# Patient Record
Sex: Female | Born: 1975
Health system: Southern US, Community
[De-identification: ages and names within clinical notes are randomized; demographics above are authoritative.]

## PROBLEM LIST (undated history)

## (undated) DIAGNOSIS — K649 Unspecified hemorrhoids: Secondary | ICD-10-CM

## (undated) DIAGNOSIS — E079 Disorder of thyroid, unspecified: Secondary | ICD-10-CM

## (undated) DIAGNOSIS — Z1501 Genetic susceptibility to malignant neoplasm of breast: Secondary | ICD-10-CM

## (undated) DIAGNOSIS — Z1509 Genetic susceptibility to other malignant neoplasm: Secondary | ICD-10-CM

## (undated) DIAGNOSIS — E039 Hypothyroidism, unspecified: Secondary | ICD-10-CM

## (undated) DIAGNOSIS — O24419 Gestational diabetes mellitus in pregnancy, unspecified control: Secondary | ICD-10-CM

## (undated) DIAGNOSIS — M549 Dorsalgia, unspecified: Secondary | ICD-10-CM

## (undated) DIAGNOSIS — R51 Headache: Secondary | ICD-10-CM

## (undated) DIAGNOSIS — Z803 Family history of malignant neoplasm of breast: Secondary | ICD-10-CM

## (undated) DIAGNOSIS — Z8632 Personal history of gestational diabetes: Secondary | ICD-10-CM

## (undated) DIAGNOSIS — F419 Anxiety disorder, unspecified: Secondary | ICD-10-CM

## (undated) HISTORY — DX: Family history of malignant neoplasm of breast: Z80.3

## (undated) HISTORY — DX: Disorder of thyroid, unspecified: E07.9

## (undated) HISTORY — DX: Genetic susceptibility to malignant neoplasm of breast: Z15.09

## (undated) HISTORY — PX: OTHER SURGICAL HISTORY: SHX169

## (undated) HISTORY — DX: Headache: R51

## (undated) HISTORY — DX: Gestational diabetes mellitus in pregnancy, unspecified control: O24.419

## (undated) HISTORY — DX: Dorsalgia, unspecified: M54.9

## (undated) HISTORY — DX: Genetic susceptibility to malignant neoplasm of breast: Z15.01

## (undated) HISTORY — DX: Unspecified hemorrhoids: K64.9

## (undated) HISTORY — DX: Anxiety disorder, unspecified: F41.9

## (undated) HISTORY — PX: TRIGGER FINGER RELEASE: SHX641

## (undated) HISTORY — PX: TUBAL LIGATION: SHX77

---

## 1998-03-21 ENCOUNTER — Other Ambulatory Visit: Admission: RE | Admit: 1998-03-21 | Discharge: 1998-03-21 | Payer: Self-pay | Admitting: Obstetrics and Gynecology

## 2001-02-03 ENCOUNTER — Other Ambulatory Visit: Admission: RE | Admit: 2001-02-03 | Discharge: 2001-02-03 | Payer: Self-pay | Admitting: Obstetrics and Gynecology

## 2002-02-06 ENCOUNTER — Other Ambulatory Visit: Admission: RE | Admit: 2002-02-06 | Discharge: 2002-02-06 | Payer: Self-pay | Admitting: Obstetrics & Gynecology

## 2002-08-08 ENCOUNTER — Other Ambulatory Visit: Admission: RE | Admit: 2002-08-08 | Discharge: 2002-08-08 | Payer: Self-pay | Admitting: Obstetrics and Gynecology

## 2003-02-11 ENCOUNTER — Other Ambulatory Visit: Admission: RE | Admit: 2003-02-11 | Discharge: 2003-02-11 | Payer: Self-pay | Admitting: Obstetrics & Gynecology

## 2005-01-25 ENCOUNTER — Encounter: Admission: RE | Admit: 2005-01-25 | Discharge: 2005-03-14 | Payer: Self-pay | Admitting: Obstetrics and Gynecology

## 2005-05-25 ENCOUNTER — Inpatient Hospital Stay (HOSPITAL_COMMUNITY): Admission: AD | Admit: 2005-05-25 | Discharge: 2005-05-29 | Payer: Self-pay | Admitting: Obstetrics and Gynecology

## 2005-05-26 ENCOUNTER — Encounter (INDEPENDENT_AMBULATORY_CARE_PROVIDER_SITE_OTHER): Payer: Self-pay | Admitting: *Deleted

## 2006-12-29 ENCOUNTER — Other Ambulatory Visit: Admission: RE | Admit: 2006-12-29 | Discharge: 2006-12-29 | Payer: Self-pay | Admitting: Radiology

## 2008-04-02 ENCOUNTER — Encounter: Admission: RE | Admit: 2008-04-02 | Discharge: 2008-07-01 | Payer: Self-pay | Admitting: Obstetrics and Gynecology

## 2008-07-30 ENCOUNTER — Inpatient Hospital Stay (HOSPITAL_COMMUNITY): Admission: RE | Admit: 2008-07-30 | Discharge: 2008-08-02 | Payer: Self-pay | Admitting: Obstetrics and Gynecology

## 2008-08-01 ENCOUNTER — Ambulatory Visit: Payer: Self-pay | Admitting: Vascular Surgery

## 2008-08-01 ENCOUNTER — Encounter (INDEPENDENT_AMBULATORY_CARE_PROVIDER_SITE_OTHER): Payer: Self-pay | Admitting: Obstetrics and Gynecology

## 2010-06-23 LAB — GLUCOSE, CAPILLARY
Glucose-Capillary: 72 mg/dL (ref 70–99)
Glucose-Capillary: 88 mg/dL (ref 70–99)

## 2010-06-23 LAB — CBC
MCHC: 35.7 g/dL (ref 30.0–36.0)
MCV: 91.9 fL (ref 78.0–100.0)
MCV: 92.5 fL (ref 78.0–100.0)
Platelets: 130 10*3/uL — ABNORMAL LOW (ref 150–400)
RBC: 3.57 MIL/uL — ABNORMAL LOW (ref 3.87–5.11)
RBC: 4.27 MIL/uL (ref 3.87–5.11)
RDW: 13.3 % (ref 11.5–15.5)
WBC: 9 10*3/uL (ref 4.0–10.5)

## 2010-06-23 LAB — RPR: RPR Ser Ql: NONREACTIVE

## 2010-06-23 LAB — BASIC METABOLIC PANEL
BUN: 7 mg/dL (ref 6–23)
Calcium: 9.1 mg/dL (ref 8.4–10.5)
Creatinine, Ser: 0.66 mg/dL (ref 0.4–1.2)
GFR calc Af Amer: >60 mL/min (ref >60)
GFR calc non Af Amer: >60 mL/min (ref >60)

## 2010-07-28 NOTE — Discharge Summary (Signed)
NAMECATHYANN, [name] NO.:  [id number]   MEDICAL RECORD NO.:  [id number]          PATIENT TYPE:  INP   LOCATION:  9116                          FACILITY:  [facility]   PHYSICIAN:  [name], M.D.DATE OF BIRTH:  [birth year]   DATE OF ADMISSION:  07/30/2008  DATE OF DISCHARGE:  08/02/2008                               DISCHARGE SUMMARY   ADMITTING DIAGNOSES:  39 weeks, repeat cesarean section, gestational  diabetes, Michelle Aden, MD   DISCHARGE DIAGNOSES:  39 weeks status post cesarean section, gestational  diabetes, Michelle Aden, MD   Michelle patient is a G2, P2 with a due date of Aug 06, 2008, initiated  prenatal care at 7 weeks at [name] with Dr. Billy Randolph as primary  [name].  Risk factors in pregnancy are significant for gestational  diabetes.  Medical history significant for hypothyroidism and Thygeson  disease which has been stable for 6 months and also notable that Michelle  patient has a history of infertility and conceived on Clomid on this  gestation and during Michelle pregnancy, was on glyburide 1.25 mg per day,  prenatal vitamins, and also on Synthroid 50 mcg p.o. daily.  Surgical  history is significant for previous C-section.  Social history is  significant for Michelle patient is married.  Michelle patient presented to [name] for repeat C-section and postoperative course has been  noncomplicated and routine.  At Michelle time of discharge, Michelle patient is  stable, vital signs were stable.  Preop labs work is significant for a  CBC.  Preoperatively, a CBC was obtained on May 17, white blood count  was 9.0, hemoglobin was 14, hematocrit was 39.3, platelets 430.  Postoperatively, on May 19, white blood count was 10.9, hemoglobin was  11.8, hematocrit is 33, platelet count is 107.  At Michelle time of  discharge, Michelle patient subjectively is feeling well and eager for  discharge to home.  She has had a history of calf pain and some bruising  to Michelle left lower  extremity, questionable varicosity inflammation and  Doppler studies have been negative.  Calf pain today is decreased.  She  is tolerating p.o. well.  She is voiding without problems.  Positive  flatus.  Pain is controlled with Percocet.  She does have marked nipple  pain and some redness.  Pain increased with nursing; otherwise,  breastfeeding is going well.  Objectively, today vital signs are stable.  Temperature 98.3, heart rate 78, respiratory rate 20, blood pressure is  126/76.  She is alert and oriented x3 in no acute distress.  Lungs were  clear to auscultation.  Heart with a regular rate and rhythm.  Abdomen  is soft, nontender, positive bowel sounds x4 quadrants.  Incision with  staples were intact.  Small amount of ecchymosis noted superior to Michelle  incision and staples.  Scant amount of lochia noted.  Extremities with  +1 pedal edema.  Mild ecchymosis to Michelle left calf.  On Michelle inner aspect,  it is  approximately 5 cm, nontender to palpation.  Negative Homans  sign.   ASSESSMENT AND PLAN:  On postoperative day #3 repeat is that Michelle pain in  left calf with Michelle varicosity has decreased dramatically.  Preliminary  Doppler study is negative and final report is pending.  Michelle patient is  in stable status for discharge to home.  Staple removal will occur in  Michelle office.  Upon discharge, prescriptions given to patient include  Motrin 600 mg p.o. every 6 hours p.r.n. #30 and Percocet 1-2 every 4  hours p.r.n. #30, no refill.  Also, a prescription for [name]  nipple cream to apply q.i.d. and p.r.n., dispense 4 ounces with refill  x1.  Activity  is up ad lib.  Diet as tolerated.  Continue postop orders.  [facility] was given.  Advised Michelle patient to report fever,  increased redness, swelling or drainage at incision site and again  followup appointment needs to be made for May 24 to have staple removal  in Michelle office.      Michelle Lindau, NP       [name], M.D.  Electronically Signed    JF/MEDQ  D:  08/02/2008  T:  08/03/2008  Job:  [id number]

## 2010-07-28 NOTE — Op Note (Signed)
NAMEABRAR, KOONE NO.:  [id number]   MEDICAL RECORD NO.:  [id number]          PATIENT TYPE:  INP   LOCATION:  9115                          FACILITY:  [facility]   PHYSICIAN:  Lenoard Aden, M.D.DATE OF BIRTH:  [birth year]   DATE OF PROCEDURE:  DATE OF DISCHARGE:                               OPERATIVE REPORT   PREOPERATIVE DIAGNOSES:  1. A 39-week previous cesarean section for repeat.  2. Gestational diabetes.   POSTOPERATIVE DIAGNOSES:  1. A 39-week previous cesarean section for repeat.  2. Gestational diabetes.   PROCEDURE:  Repeat low-segment transverse cesarean section.   SURGEON:  Lenoard Aden, MD   ASSISTANT:  Merrilee Jansky, CNM   ANESTHESIA:  Spinal by Jean Rosenthal.   ESTIMATED BLOOD LOSS:  1000 mL.   COMPLICATIONS:  None.   DRAINS:  Foley.   COUNTS:  Correct.   DISPOSITION:  The patient to recovery in good condition.   FINDINGS:  Full-term living female, occiput anterior position, Apgars of 8  and 9, anterior placenta, clear fluid.  Normal right tube and ovary,  congenitally absent left tube and ovary, two-layer uterine closure.   BRIEF OPERATIVE NOTE:  After being apprised risks of anesthesia,  infection, bleeding, injury to abdominal organs, need for repair,  delayed versus immediate complications to include bowel and bladder  injury.  The patient was brought to the operating room where she was  administered spinal anesthetic without complications, prepped, and  draped in usual sterile fashion.  Foley catheter placed after achieving  adequate anesthesia, dilute Marcaine solution was placed.  Pfannenstiel  skin incision made with scalpel, carried down to fascia which nicked in  midline and opened transversely using Mayo scissors.  Rectus muscles  were dissected sharply in midline.  Peritoneum entered sharply.  Bladder  blade placed.  Visceral peritoneum scored sharply off the lower uterine  segment.  Kerr hysterotomy incision was made.   Atraumatic delivery, full-  term living female, handed to pediatricians in attendance, Apgars 8 and 9.  Cord blood collected.  Placenta delivered manually, intact 3-vessel  cord.  Uterus was exteriorized, curetted using dry lap pack and closed  in 2 running imbricating layers of 0 Monocryl suture.  Good hemostasis  noted.  Single interrupted suture placed in midline for hemostasis.  Bladder flap inspected and found to be  hemostatic.  Peritoneum was closed using a 2-0 chromic in a continuous  running fashion.  Fascia closed using 0 Monocryl in a running fashion.  Skin closed using staples.  The patient tolerated the procedure well and  was transferred to recovery in good condition.      Lenoard Aden, M.D.  Electronically Signed     RJT/MEDQ  D:  07/30/2008  T:  07/31/2008  Job:  [id number]

## 2010-07-31 NOTE — Op Note (Signed)
[name], LAVIS NO.:  [id number]   MEDICAL RECORD NO.:  [id number]          PATIENT TYPE:  INP   LOCATION:  9104                          [facility]ACILITY:  [facility]   PHYSICIAN:  Gerri Spore B. Earlene Plater, M.D.  DATE OF BIRTH:  07/06/1975   DATE OF PROCEDURE:  05/26/2005  DATE OF DISCHARGE:                                 OPERATIVE REPORT   PREOPERATIVE DIAGNOSES:  1.  Thirty-nine-week pregnancy.  2.  Gestational diabetes.  3.  Failure to progress.   POSTOPERATIVE DIAGNOSES:  1.  Thirty-nine-week pregnancy.  2.  Gestational diabetes.  3.  Failure to progress.   OPERATION/PROCEDURE:  Primary low transverse cesarean section.   SURGEON:  Chester Holstein. Earlene Plater, M.D.   ASSISTANT:  None.   ANESTHESIA:  Epidural.   SPECIMENS:  Placenta.   ESTIMATED BLOOD LOSS:  600.   COMPLICATIONS:  None.   OPERATIVE FINDINGS:  Viable female, OP position, Apgars 8 and 9, weight 7  pounds 2 ounces.  Normal-appearing uterus, right tube and ovary.  The left  tube and ovary are absent presumably congenitally as the patient has no  history of abdominal surgery.   INDICATIONS:  The patient presented in labor and was augmented ultimately to  a rim beyond on which she would not dilate for over 2-1/2 hours.  The OP  position was suspected and the patient is noted to have a narrow pelvis and  short stature and gestational diabetes. Therefore, cesarean section was  recommended.  The risks of surgery were discussed including bleeding,  infection, damage to surrounding organs.   DESCRIPTION OF PROCEDURE:  The patient was taken to the operating room with  epidural anesthesia in place.  Foley catheter was in the bladder.  She was  prepped and draped in the standard fashion.  A Pfannenstiel incision was  made and carried sharply to the fascia. The fascia was divided sharply.  Posterior rectus muscles were dissected off sharply.  Posterior sheath and  peritoneum elevated and entered sharply.  Bladder flap  created with sharp  and blunt technique.  Uterine incision made in the low transverse fashion  with a knife.  Clear fluid on amniotomy.   The infant's head was elevated through the incision.  Nose and mouth  suctioned with the bulb.  The remainder of the infant delivered without  difficulty.  Nuchal cord x1 noted.  The cord was clamped and cut.  Infant  handed off to the awaiting pediatricians.  The patient was not given  additional prophylaxis as she had been on continuous penicillin prophylaxis  for group B strep positive, the last dose approximately one hour prior to  surgery.   The placenta was removed manually, uterus exteriorized and cleared of all  clots and debris.  The incision was free of extension.  It was closed in two  layers with 0 chromic.  Hemostasis was obtained.  Pelvic was inspected and  the above findings noted.  Uterus was returned to the abdomen.  Pelvis  irrigated.  Uterine incision and bladder flap were hemostatic as was the  subfascial space.  The fascia was  closed with a running stitch of 0 Vicryl.  Subcutaneous tissue was irrigated and made hemostatic with the Bovie and  reapproximated with a running stitch of plain 2-0 suture.  Skin was closed  with staples.  The patient tolerated the procedure well without  complications.  She was taken to the recovery room awake, alert and in  stable condition.  All counts correct per the operating room staff.      Gerri Spore B. Earlene Plater, M.D.  Electronically Signed     WBD/MEDQ  D:  05/26/2005  T:  05/27/2005  Job:  [id number]

## 2010-07-31 NOTE — Discharge Summary (Signed)
NAMERIELEY, KHALSA NO.:  [id number]   MEDICAL RECORD NO.:  [id number]           PATIENT TYPE:   LOCATION:                                 FACILITY:   PHYSICIAN:  [place] B. Earlene Plater, M.D.       DATE OF BIRTH:   DATE OF ADMISSION:  05/25/2005  DATE OF DISCHARGE:  05/29/2005                                 DISCHARGE SUMMARY   ADMISSION DIAGNOSES:  1.  A 38-week intrauterine pregnancy.  2.  Onset of labor.  3.  Failure to progress.   DISCHARGE DIAGNOSES:  1.  A 38-week intrauterine pregnancy.  2.  Onset of labor.  3.  Failure to progress.   PROCEDURES:  Low transverse C-section.   HISTORY OF PRESENT ILLNESS:  For details, please see the H&P on the chart.  Briefly, the patient presented as a 35 year old G1, P0, at 38-6/7 weeks with  onset of labor.  She was admitted and given penicillin for group B strep  prophylaxis and progressed slowly.  Potassium augmentation given.  Ultimately progressed to 9 cm but not beyond and was diagnosed with active  phase arrest.  C-section was recommended.   The patient was subsequently delivered by primary low transverse cesarean  section.  OP position, female, Apgars 8 and 9, 7 pounds 2 ounces.  Left tube  and ovary were noted to be congenitally absent.  Postoperatively, the  patient rapidly regained her ability to ambulate, void, and  tolerate  regular diet.  She was discharged home on third postoperative day 3 in  satisfactory condition.   DISCHARGE INSTRUCTIONS:  Standard instructions given prior to dismissal.  Medications Tylenol 1-2 q.4-6 h p.r.n. pain.      Gerri Spore B. Earlene Plater, M.D.  Electronically Signed     WBD/MEDQ  D:  06/25/2005  T:  06/25/2005  Job:  [id number]

## 2012-08-31 HISTORY — PX: COLONOSCOPY WITH PROPOFOL: SHX5780

## 2012-10-24 ENCOUNTER — Ambulatory Visit (INDEPENDENT_AMBULATORY_CARE_PROVIDER_SITE_OTHER): Payer: BC Managed Care – PPO | Admitting: General Surgery

## 2012-10-24 ENCOUNTER — Encounter (INDEPENDENT_AMBULATORY_CARE_PROVIDER_SITE_OTHER): Payer: Self-pay

## 2012-10-24 ENCOUNTER — Encounter (INDEPENDENT_AMBULATORY_CARE_PROVIDER_SITE_OTHER): Payer: Self-pay | Admitting: General Surgery

## 2012-10-24 VITALS — BP 138/80 | HR 74 | Resp 16 | Ht 62.0 in | Wt 207.6 lb

## 2012-10-24 DIAGNOSIS — K648 Other hemorrhoids: Secondary | ICD-10-CM | POA: Insufficient documentation

## 2012-10-24 NOTE — Progress Notes (Signed)
Patient ID: [name], female   DOB: April 02, 1975, 37 y.o.   MRN: [id number]  Chief Complaint  Patient presents with  . New Evaluation    eval hems    HPI [name] is a 37 y.o. female.   HPI  She is referred by Dr. Loreta Ave for further evaluation and treatment of internal hemorrhoids. She's had episodes of bright red blood per rectum for the past 6 months. She underwent a colonoscopy. Prominent internal hemorrhoids were noted. She states sometimes she has to push them back in. She denies having significant constipation.  Past Medical History  Diagnosis Date  . Thyroid disease     hypothyroidism  . Gestational diabetes   . Hemorrhoids   . Chronic headaches   . Back pain     Past Surgical History  Procedure Laterality Date  . Cesarean section  2007 & 2010    Family History  Problem Relation Age of Onset  . Cancer Mother     breast  . Diabetes Mother   . Kidney disease Father   . Heart disease Father   . Hypertension Father   . Cancer Maternal Grandmother     breast    Social History History  Substance Use Topics  . Smoking status: Never Smoker   . Smokeless tobacco: Never Used  . Alcohol Use: No    No Known Allergies  Current Outpatient Prescriptions  Medication Sig Dispense Refill  . ibuprofen (ADVIL,MOTRIN) 800 MG tablet       . Levothyroxine Sodium (SYNTHROID PO) Take by mouth.       No current facility-administered medications for this visit.    Review of Systems Review of Systems  Constitutional: Negative.   Respiratory: Negative.   Cardiovascular: Negative.   Gastrointestinal: Positive for anal bleeding. Negative for constipation and rectal pain.    Blood pressure 138/80, pulse 74, resp. rate 16, height 5\' 2"  (1.575 m), weight 207 lb 9.6 oz (94.167 kg).  Physical Exam Physical Exam  Constitutional:  Overweight female.  Genitourinary:  Anorectal-external hemorrhoids noted right posterior, right anterior, and left lateral. No fissures. A  digital rectal exam sphincter tone is normal and no masses are noted.  Anoscopy demonstrates a moderate to large-size internal hemorrhoid right posterior. Moderate-sized internal hemorrhoid right anterior. Small hemorrhoid left lateral.  After discussion with her rubber band ligation was performed a part of the hemorrhoid right posterior area which she tolerated well.    Data Reviewed Notes from Dr. Loreta Ave. Colonoscopy report.  Assessment    Intermittently bleeding and prolapsing internal hemorrhoids. Right posterior hemorrhoid underwent rubber band ligation. She tolerated this well.     Plan    Return visit in 3 weeks to see how she is doing with respect to her rectal bleeding and prolapse. May need another rubber band ligation treatment.        [name] J 10/24/2012, 6:02 PM

## 2012-10-24 NOTE — Patient Instructions (Signed)
Call if you have heavy, uncontrollable bleeding.

## 2012-10-25 ENCOUNTER — Encounter (INDEPENDENT_AMBULATORY_CARE_PROVIDER_SITE_OTHER): Payer: Self-pay

## 2012-11-22 ENCOUNTER — Encounter (INDEPENDENT_AMBULATORY_CARE_PROVIDER_SITE_OTHER): Payer: Self-pay | Admitting: General Surgery

## 2012-11-22 ENCOUNTER — Ambulatory Visit (INDEPENDENT_AMBULATORY_CARE_PROVIDER_SITE_OTHER): Payer: BC Managed Care – PPO | Admitting: General Surgery

## 2012-11-22 VITALS — BP 126/78 | HR 63 | Temp 97.2°F | Resp 14 | Ht 62.0 in | Wt 205.6 lb

## 2012-11-22 DIAGNOSIS — K648 Other hemorrhoids: Secondary | ICD-10-CM

## 2012-11-22 NOTE — Progress Notes (Addendum)
Subjective:     Patient ID: [name], female   DOB: [birth year], 37 y.o.   MRN: [id number]  HPIShe's see her for follow up of her bleeding and prolapsing internal hemorrhoids. She states she is doing better. She had one episode of bleeding  recently. She is avoiding constipation for   Review of Systems As above      Objective:   Physical Exam Anorectal-external hemorrhoids are noted. On digital rectal exam no masses are felt. Anoscopy demonstrates significantly smaller size of right posterior hemorrhoid but with some inflammatory changes. No change in other hemorrhoids. Repeat rubber band ligation was performed of the right posterior hemorrhoid.    Assessment:     Bleeding and prolapsing internal hemorrhoids-improved after first rubber band ligation treatment. Repeat rubber band ligation done.     Plan:     Avoid constipation and prolonged sitting. Return visit 4 weeks.

## 2012-11-22 NOTE — Patient Instructions (Addendum)
Avoid constipation and prolonged sitting.

## 2012-12-27 ENCOUNTER — Encounter (INDEPENDENT_AMBULATORY_CARE_PROVIDER_SITE_OTHER): Payer: Self-pay | Admitting: General Surgery

## 2012-12-27 ENCOUNTER — Encounter (INDEPENDENT_AMBULATORY_CARE_PROVIDER_SITE_OTHER): Payer: Self-pay

## 2012-12-27 ENCOUNTER — Ambulatory Visit (INDEPENDENT_AMBULATORY_CARE_PROVIDER_SITE_OTHER): Payer: BC Managed Care – PPO | Admitting: General Surgery

## 2012-12-27 VITALS — BP 130/80 | HR 80 | Temp 98.6°F | Resp 15 | Ht 62.0 in | Wt 211.6 lb

## 2012-12-27 DIAGNOSIS — K648 Other hemorrhoids: Secondary | ICD-10-CM

## 2012-12-27 NOTE — Patient Instructions (Signed)
Eat more raw fruits, raw vegetables, and drains. Avoid constipation. Call if the bleeding increases.

## 2012-12-27 NOTE — Progress Notes (Signed)
Subjective:     Patient ID: [name], female   DOB: [birth year], 37 y.o.   MRN: [id number]  HPI  She is here for a followup visit. She had more discomfort after the last rubber band ligation treatment. She still having some bleeding but it is much less frequent overall. She is considering having more children.   Review of Systems As above      Objective:   Physical Exam Anorectal-external hemorrhoids are noted. Anoscopy notes a persistent moderate sized internal hemorrhoid in the right posterior position. A small internal hemorrhoid is present in the right anterior position.   Assessment:     Bleeding and prolapsing internal hemorrhoids-Right posterior hemorrhoid is persistent despite repeat rubber band ligation. However the bleeding is significantly improved.   Plan:     Increase fiber in diet. Avoid constipation. I would not perform a hemorrhoidectomy given that she is considering having another child. Return visit if the bleeding increases.

## 2013-11-05 LAB — OB RESULTS CONSOLE ABO/RH: RH Type: NEGATIVE

## 2013-11-05 LAB — OB RESULTS CONSOLE HIV ANTIBODY (ROUTINE TESTING): HIV: NONREACTIVE

## 2013-11-05 LAB — OB RESULTS CONSOLE ANTIBODY SCREEN: ANTIBODY SCREEN: NEGATIVE

## 2013-11-05 LAB — OB RESULTS CONSOLE HEPATITIS B SURFACE ANTIGEN: HEP B S AG: NEGATIVE

## 2013-11-05 LAB — OB RESULTS CONSOLE RUBELLA ANTIBODY, IGM: RUBELLA: IMMUNE

## 2013-11-05 LAB — OB RESULTS CONSOLE RPR: RPR: NONREACTIVE

## 2014-04-15 ENCOUNTER — Other Ambulatory Visit: Payer: Self-pay | Admitting: Obstetrics and Gynecology

## 2014-06-13 ENCOUNTER — Encounter (HOSPITAL_COMMUNITY): Payer: Self-pay

## 2014-06-13 NOTE — Patient Instructions (Addendum)
   Your procedure is scheduled on:  Saturday, April 2  [facility]: 7:45 AM Pick up the phone at the desk and dial [phone] and inform us of your arrival.  Please call this number if you have any problems the morning of surgery: [phone]  Remember: Do not eat or drink after midnight: Friday-tonight Take these medicines the morning of surgery with a SIP OF WATER:  Synthroid.  Do not take diabetes medication on day of surgery.  We will check your sugar upon arrival to Short Stay Dept.  Do not wear jewelry, make-up, or FINGER nail polish No metal in your hair or on your body. Do not wear lotions, powders, perfumes.  You may wear deodorant.  Do not bring valuables to the hospital. Contacts, dentures or bridgework may not be worn into surgery.  Leave suitcase in the car. After Surgery it may be brought to your room. For patients being admitted to the hospital, checkout time is 11:00am the day of discharge.  Home with husband Vernia BuffMarc cell [phone]

## 2014-06-14 ENCOUNTER — Encounter (HOSPITAL_COMMUNITY)
Admission: RE | Admit: 2014-06-14 | Discharge: 2014-06-14 | Disposition: A | Discharge status: Home / Self Care | Payer: BLUE CROSS/BLUE SHIELD | Source: Ambulatory Visit | Attending: Obstetrics and Gynecology | Admitting: Obstetrics and Gynecology

## 2014-06-14 ENCOUNTER — Encounter (HOSPITAL_COMMUNITY): Payer: Self-pay

## 2014-06-14 HISTORY — DX: Hypothyroidism, unspecified: E03.9

## 2014-06-14 LAB — CBC
HEMATOCRIT: 40 % (ref 36.0–46.0)
Hemoglobin: 13.9 g/dL (ref 12.0–15.0)
MCH: 31.9 pg (ref 26.0–34.0)
MCHC: 34.8 g/dL (ref 30.0–36.0)
MCV: 91.7 fL (ref 78.0–100.0)
Platelets: 146 10*3/uL — ABNORMAL LOW (ref 150–400)
RBC: 4.36 MIL/uL (ref 3.87–5.11)
RDW: 13.5 % (ref 11.5–15.5)
WBC: 10.8 10*3/uL — ABNORMAL HIGH (ref 4.0–10.5)

## 2014-06-14 LAB — BASIC METABOLIC PANEL
ANION GAP: 8 (ref 5–15)
BUN: 7 mg/dL (ref 6–23)
CALCIUM: 9.1 mg/dL (ref 8.4–10.5)
CHLORIDE: 106 mmol/L (ref 96–112)
CO2: 22 mmol/L (ref 19–32)
CREATININE: 0.66 mg/dL (ref 0.50–1.10)
GFR calc non Af Amer: >90 mL/min (ref >90)
Glucose, Bld: 116 mg/dL — ABNORMAL HIGH (ref 70–99)
Potassium: 3.7 mmol/L (ref 3.5–5.1)
Sodium: 136 mmol/L (ref 135–145)

## 2014-06-14 LAB — ABO/RH: ABO/RH(D): O NEG

## 2014-06-14 NOTE — H&P (Signed)
NAMTyler Aas:  Rebert, Abbygayle             ACCOUNT NO.:  [id number]  MEDICAL RECORD NO.:  [id number]  LOCATION:  SDC                           [facility]ILITY:  [facility]  PHYSICIAN:  Lenoard Adenichard J. Fate Caster, M.D.DATE OF BIRTH:  November 10, 1975  DATE OF ADMISSION:  06/14/2014 DATE OF DISCHARGE:                             HISTORY & PHYSICAL   CHIEF COMPLAINT:  Previous C-section for repeat tubal ligation. History of Class A2DM on Glyburide with labile BS but reassuring fetal surveillance.  HISTORY OF PRESENT ILLNESS:  She is a 39 year-old white female, G3, P2, who presents for repeat C-section and tubal ligation.  Has a history of congenitally absent left tube and ovary.  ALLERGIES:  She has no known drug allergies.  MEDICATIONS:  Glyburide, Synthroid, Tylenol, and prenatal vitamins.  SOCIAL HISTORY:  Nonsmoker, nondrinker.  Denies domestic violence.  FAMILY HISTORY:  Heart disease, kidney failure, cancer, thyroid disease, neurologic disorder.  SURGICAL HISTORY:  Remarkable for C-section x2.  PHYSICAL EXAMINATION:  GENERAL:  Well-developed, well-nourished white female in no acute distress. HEENT:  Normal. NECK:  Supple.  Full range of motion. LUNGS:  Clear. HEART:  Regular rate and rhythm. ABDOMEN:  Soft, gravid, nontender.  Estimated fetal weight 8.5 pounds. Cervix is closed, 70%, vertex, -2. EXTREMITIES:  There are no cords. NEUROLOGIC:  Nonfocal. SKIN:  Intact.  IMPRESSION: 1. Previous cesarean section x2 for repeat. 2. Congenitally absent left tube and ovary. 3. Class A2DM on Glyburide with LGA and mild polyhydrmanios. Borderline       glucose compliance. Reassuring Fetal Surveillance. 4. Hypothyroidism  PLAN:  Proceed with repeat C-section, tubal ligation.  Risks of anesthesia, infection, bleeding, injury to surrounding organs, possible need for repair was discussed.  Delayed versus immediate complications to include bowel and bladder injury noted.  The patient acknowledges and wishes to  proceed. Failure risks of TL discussed.     Lenoard Adenichard J. Wynne Jury, M.D.     RJT/MEDQ  D:  06/14/2014  T:  06/14/2014  Job:  [id number]

## 2014-06-15 ENCOUNTER — Encounter (HOSPITAL_COMMUNITY): Payer: Self-pay

## 2014-06-15 ENCOUNTER — Inpatient Hospital Stay (HOSPITAL_COMMUNITY): Payer: BLUE CROSS/BLUE SHIELD | Admitting: Anesthesiology

## 2014-06-15 ENCOUNTER — Encounter (HOSPITAL_COMMUNITY)
Admission: RE | Disposition: A | Discharge status: Home / Self Care | Payer: Self-pay | Source: Ambulatory Visit | Attending: Obstetrics and Gynecology

## 2014-06-15 ENCOUNTER — Inpatient Hospital Stay (HOSPITAL_COMMUNITY)
Admission: RE | Admit: 2014-06-15 | Discharge: 2014-06-17 | DRG: 765 | Disposition: A | Discharge status: Home / Self Care | Payer: BLUE CROSS/BLUE SHIELD | Source: Ambulatory Visit | Attending: Obstetrics and Gynecology | Admitting: Obstetrics and Gynecology

## 2014-06-15 DIAGNOSIS — Z6791 Unspecified blood type, Rh negative: Secondary | ICD-10-CM

## 2014-06-15 DIAGNOSIS — E039 Hypothyroidism, unspecified: Secondary | ICD-10-CM | POA: Diagnosis present

## 2014-06-15 DIAGNOSIS — Z3A39 39 weeks gestation of pregnancy: Secondary | ICD-10-CM | POA: Diagnosis present

## 2014-06-15 DIAGNOSIS — O9912 Other diseases of the blood and blood-forming organs and certain disorders involving the immune mechanism complicating childbirth: Secondary | ICD-10-CM | POA: Diagnosis present

## 2014-06-15 DIAGNOSIS — O09523 Supervision of elderly multigravida, third trimester: Secondary | ICD-10-CM

## 2014-06-15 DIAGNOSIS — O99119 Other diseases of the blood and blood-forming organs and certain disorders involving the immune mechanism complicating pregnancy, unspecified trimester: Secondary | ICD-10-CM

## 2014-06-15 DIAGNOSIS — O24429 Gestational diabetes mellitus in childbirth, unspecified control: Secondary | ICD-10-CM | POA: Diagnosis present

## 2014-06-15 DIAGNOSIS — O3421 Maternal care for scar from previous cesarean delivery: Secondary | ICD-10-CM | POA: Diagnosis present

## 2014-06-15 DIAGNOSIS — D696 Thrombocytopenia, unspecified: Secondary | ICD-10-CM | POA: Diagnosis present

## 2014-06-15 DIAGNOSIS — O26893 Other specified pregnancy related conditions, third trimester: Secondary | ICD-10-CM | POA: Diagnosis present

## 2014-06-15 DIAGNOSIS — O24419 Gestational diabetes mellitus in pregnancy, unspecified control: Secondary | ICD-10-CM

## 2014-06-15 DIAGNOSIS — O99284 Endocrine, nutritional and metabolic diseases complicating childbirth: Secondary | ICD-10-CM | POA: Diagnosis present

## 2014-06-15 LAB — GLUCOSE, CAPILLARY
GLUCOSE-CAPILLARY: 93 mg/dL (ref 70–99)
Glucose-Capillary: 98 mg/dL (ref 70–99)

## 2014-06-15 LAB — RPR: RPR: NONREACTIVE

## 2014-06-15 LAB — PREPARE RBC (CROSSMATCH)

## 2014-06-15 SURGERY — Surgical Case
Anesthesia: Spinal | Laterality: Bilateral

## 2014-06-15 MED ORDER — LANOLIN HYDROUS EX OINT: 1.0000 "application " | TOPICAL_OINTMENT | CUTANEOUS | Status: DC | PRN

## 2014-06-15 MED ORDER — DIBUCAINE 1 % RE OINT: 1.0000 "application " | TOPICAL_OINTMENT | RECTAL | Status: DC | PRN

## 2014-06-15 MED ORDER — NALBUPHINE HCL 10 MG/ML IJ SOLN: 5.0000 mg | INTRAMUSCULAR | Status: DC | PRN

## 2014-06-15 MED ORDER — LEVOTHYROXINE SODIUM 100 MCG PO TABS
100.0000 ug | ORAL_TABLET | Freq: Every day | ORAL | Status: DC
  Administered 2014-06-16 – 2014-06-17 (×2): 100 ug via ORAL
  Filled 2014-06-15 (×2): qty 1

## 2014-06-15 MED ORDER — NALBUPHINE HCL 10 MG/ML IJ SOLN: 5.0000 mg | Freq: Once | INTRAMUSCULAR | Status: AC | PRN

## 2014-06-15 MED ORDER — MEPERIDINE HCL 25 MG/ML IJ SOLN
INTRAMUSCULAR | Status: DC | PRN
  Administered 2014-06-15 (×2): 12.5 mg via INTRAVENOUS

## 2014-06-15 MED ORDER — CEFAZOLIN SODIUM-DEXTROSE 2-3 GM-% IV SOLR
2.0000 g | INTRAVENOUS | Status: AC
  Administered 2014-06-15: 2 g via INTRAVENOUS

## 2014-06-15 MED ORDER — SCOPOLAMINE 1 MG/3DAYS TD PT72: 1.0000 | MEDICATED_PATCH | Freq: Once | TRANSDERMAL | Status: DC

## 2014-06-15 MED ORDER — METHYLERGONOVINE MALEATE 0.2 MG/ML IJ SOLN: 0.2000 mg | INTRAMUSCULAR | Status: DC | PRN

## 2014-06-15 MED ORDER — OXYTOCIN 40 UNITS IN LACTATED RINGERS INFUSION - SIMPLE MED: 62.5000 mL/h | INTRAVENOUS | Status: DC

## 2014-06-15 MED ORDER — FENTANYL CITRATE 0.05 MG/ML IJ SOLN
INTRAMUSCULAR | Status: DC | PRN
Start: 2014-06-15 — End: 2014-06-15
  Administered 2014-06-15: 25 ug via INTRATHECAL

## 2014-06-15 MED ORDER — DIPHENHYDRAMINE HCL 50 MG/ML IJ SOLN: 12.5000 mg | INTRAMUSCULAR | Status: DC | PRN

## 2014-06-15 MED ORDER — SIMETHICONE 80 MG PO CHEW
80.0000 mg | CHEWABLE_TABLET | Freq: Three times a day (TID) | ORAL | Status: DC
  Administered 2014-06-15 – 2014-06-16 (×4): 80 mg via ORAL
  Filled 2014-06-15 (×4): qty 1

## 2014-06-15 MED ORDER — ZOLPIDEM TARTRATE 5 MG PO TABS: 5.0000 mg | ORAL_TABLET | Freq: Every evening | ORAL | Status: DC | PRN

## 2014-06-15 MED ORDER — BUPIVACAINE LIPOSOME 1.3 % IJ SUSP
INTRAMUSCULAR | Status: DC | PRN
  Administered 2014-06-15: 20 mL

## 2014-06-15 MED ORDER — OXYTOCIN 10 UNIT/ML IJ SOLN
INTRAMUSCULAR | Status: AC
  Filled 2014-06-15: qty 4

## 2014-06-15 MED ORDER — MENTHOL 3 MG MT LOZG: 1.0000 | LOZENGE | OROMUCOSAL | Status: DC | PRN

## 2014-06-15 MED ORDER — MEPERIDINE HCL 25 MG/ML IJ SOLN: 6.2500 mg | INTRAMUSCULAR | Status: DC | PRN

## 2014-06-15 MED ORDER — METOCLOPRAMIDE HCL 5 MG/ML IJ SOLN
INTRAMUSCULAR | Status: DC | PRN
Start: 2014-06-15 — End: 2014-06-15
  Administered 2014-06-15 (×2): 5 mg via INTRAVENOUS

## 2014-06-15 MED ORDER — DIPHENHYDRAMINE HCL 25 MG PO CAPS
25.0000 mg | ORAL_CAPSULE | ORAL | Status: DC | PRN
Start: 2014-06-15 — End: 2014-06-16
  Filled 2014-06-15: qty 1

## 2014-06-15 MED ORDER — NALOXONE HCL 0.4 MG/ML IJ SOLN: 0.4000 mg | INTRAMUSCULAR | Status: DC | PRN

## 2014-06-15 MED ORDER — MEPERIDINE HCL 25 MG/ML IJ SOLN
INTRAMUSCULAR | Status: AC
  Filled 2014-06-15: qty 1

## 2014-06-15 MED ORDER — BUPIVACAINE HCL (PF) 0.25 % IJ SOLN
INTRAMUSCULAR | Status: DC | PRN
  Administered 2014-06-15: 10 mL

## 2014-06-15 MED ORDER — ONDANSETRON HCL 4 MG/2ML IJ SOLN
INTRAMUSCULAR | Status: DC | PRN
  Administered 2014-06-15: 4 mg via INTRAVENOUS

## 2014-06-15 MED ORDER — SIMETHICONE 80 MG PO CHEW
80.0000 mg | CHEWABLE_TABLET | ORAL | Status: DC | PRN
  Administered 2014-06-16: 80 mg via ORAL

## 2014-06-15 MED ORDER — LACTATED RINGERS IV SOLN
INTRAVENOUS | Status: DC
  Administered 2014-06-15: 125 mL/h via INTRAVENOUS

## 2014-06-15 MED ORDER — PRENATAL MULTIVITAMIN CH
1.0000 | ORAL_TABLET | Freq: Every day | ORAL | Status: DC
  Administered 2014-06-15 – 2014-06-16 (×2): 1 via ORAL
  Filled 2014-06-15 (×2): qty 1

## 2014-06-15 MED ORDER — MORPHINE SULFATE (PF) 0.5 MG/ML IJ SOLN
INTRAMUSCULAR | Status: DC | PRN
Start: 2014-06-15 — End: 2014-06-15
  Administered 2014-06-15: .1 mg via INTRATHECAL

## 2014-06-15 MED ORDER — IBUPROFEN 600 MG PO TABS
600.0000 mg | ORAL_TABLET | Freq: Four times a day (QID) | ORAL | Status: DC
  Administered 2014-06-15 – 2014-06-17 (×7): 600 mg via ORAL
  Filled 2014-06-15 (×7): qty 1

## 2014-06-15 MED ORDER — FENTANYL CITRATE 0.05 MG/ML IJ SOLN
INTRAMUSCULAR | Status: AC
  Filled 2014-06-15: qty 2

## 2014-06-15 MED ORDER — LACTATED RINGERS IV SOLN
Freq: Once | INTRAVENOUS | Status: AC
  Administered 2014-06-15: 09:00:00 via INTRAVENOUS

## 2014-06-15 MED ORDER — LACTATED RINGERS IV SOLN
INTRAVENOUS | Status: DC
  Administered 2014-06-15 (×3): via INTRAVENOUS

## 2014-06-15 MED ORDER — OXYTOCIN 10 UNIT/ML IJ SOLN
40.0000 [IU] | INTRAVENOUS | Status: DC | PRN
  Administered 2014-06-15: 40 [IU] via INTRAVENOUS

## 2014-06-15 MED ORDER — TETANUS-DIPHTH-ACELL PERTUSSIS 5-2.5-18.5 LF-MCG/0.5 IM SUSP: 0.5000 mL | Freq: Once | INTRAMUSCULAR | Status: DC

## 2014-06-15 MED ORDER — SCOPOLAMINE 1 MG/3DAYS TD PT72
1.0000 | MEDICATED_PATCH | Freq: Once | TRANSDERMAL | Status: DC
  Administered 2014-06-15: 1.5 mg via TRANSDERMAL

## 2014-06-15 MED ORDER — MORPHINE SULFATE 0.5 MG/ML IJ SOLN
INTRAMUSCULAR | Status: AC
Start: 2014-06-15 — End: 2014-06-15
  Filled 2014-06-15: qty 10

## 2014-06-15 MED ORDER — ONDANSETRON HCL 4 MG/2ML IJ SOLN
INTRAMUSCULAR | Status: AC
  Filled 2014-06-15: qty 2

## 2014-06-15 MED ORDER — PHENYLEPHRINE 8 MG IN D5W 100 ML (0.08MG/ML) PREMIX OPTIME
INJECTION | INTRAVENOUS | Status: DC | PRN
  Administered 2014-06-15: 60 ug/min via INTRAVENOUS

## 2014-06-15 MED ORDER — OXYCODONE-ACETAMINOPHEN 5-325 MG PO TABS
1.0000 | ORAL_TABLET | ORAL | Status: DC | PRN
  Administered 2014-06-16 – 2014-06-17 (×3): 1 via ORAL
  Filled 2014-06-15 (×2): qty 1

## 2014-06-15 MED ORDER — SCOPOLAMINE 1 MG/3DAYS TD PT72
MEDICATED_PATCH | TRANSDERMAL | Status: AC
  Administered 2014-06-15: 1.5 mg via TRANSDERMAL
  Filled 2014-06-15: qty 1

## 2014-06-15 MED ORDER — LACTATED RINGERS IV SOLN
INTRAVENOUS | Status: DC | PRN
Start: 2014-06-15 — End: 2014-06-15
  Administered 2014-06-15: 10:00:00 via INTRAVENOUS

## 2014-06-15 MED ORDER — BUPIVACAINE HCL (PF) 0.25 % IJ SOLN
INTRAMUSCULAR | Status: AC
  Filled 2014-06-15: qty 10

## 2014-06-15 MED ORDER — CEFAZOLIN SODIUM-DEXTROSE 2-3 GM-% IV SOLR
INTRAVENOUS | Status: AC
  Filled 2014-06-15: qty 50

## 2014-06-15 MED ORDER — METHYLERGONOVINE MALEATE 0.2 MG PO TABS: 0.2000 mg | ORAL_TABLET | ORAL | Status: DC | PRN

## 2014-06-15 MED ORDER — WITCH HAZEL-GLYCERIN EX PADS: 1.0000 "application " | MEDICATED_PAD | CUTANEOUS | Status: DC | PRN

## 2014-06-15 MED ORDER — NALOXONE HCL 1 MG/ML IJ SOLN: 1.0000 ug/kg/h | INTRAVENOUS | Status: DC | PRN

## 2014-06-15 MED ORDER — PHENYLEPHRINE 40 MCG/ML (10ML) SYRINGE FOR IV PUSH (FOR BLOOD PRESSURE SUPPORT)
PREFILLED_SYRINGE | INTRAVENOUS | Status: AC
  Filled 2014-06-15: qty 10

## 2014-06-15 MED ORDER — DIPHENHYDRAMINE HCL 25 MG PO CAPS: 25.0000 mg | ORAL_CAPSULE | Freq: Four times a day (QID) | ORAL | Status: DC | PRN

## 2014-06-15 MED ORDER — OXYCODONE-ACETAMINOPHEN 5-325 MG PO TABS: 2.0000 | ORAL_TABLET | ORAL | Status: DC | PRN

## 2014-06-15 MED ORDER — ONDANSETRON HCL 4 MG/2ML IJ SOLN: 4.0000 mg | Freq: Three times a day (TID) | INTRAMUSCULAR | Status: DC | PRN

## 2014-06-15 MED ORDER — SIMETHICONE 80 MG PO CHEW
80.0000 mg | CHEWABLE_TABLET | ORAL | Status: DC
  Administered 2014-06-16: 80 mg via ORAL
  Filled 2014-06-15: qty 1

## 2014-06-15 MED ORDER — PHENYLEPHRINE 8 MG IN D5W 100 ML (0.08MG/ML) PREMIX OPTIME
INJECTION | INTRAVENOUS | Status: AC
  Filled 2014-06-15: qty 100

## 2014-06-15 MED ORDER — ACETAMINOPHEN 325 MG PO TABS
650.0000 mg | ORAL_TABLET | ORAL | Status: DC | PRN
  Administered 2014-06-15: 650 mg via ORAL
  Filled 2014-06-15: qty 2

## 2014-06-15 MED ORDER — SENNOSIDES-DOCUSATE SODIUM 8.6-50 MG PO TABS
2.0000 | ORAL_TABLET | ORAL | Status: DC
  Administered 2014-06-16 (×2): 2 via ORAL
  Filled 2014-06-15 (×2): qty 2

## 2014-06-15 MED ORDER — HYDROMORPHONE HCL 1 MG/ML IJ SOLN: 0.2500 mg | INTRAMUSCULAR | Status: DC | PRN

## 2014-06-15 MED ORDER — SODIUM CHLORIDE 0.9 % IJ SOLN: 3.0000 mL | INTRAMUSCULAR | Status: DC | PRN

## 2014-06-15 MED ORDER — BUPIVACAINE LIPOSOME 1.3 % IJ SUSP
20.0000 mL | Freq: Once | INTRAMUSCULAR | Status: DC
  Filled 2014-06-15: qty 20

## 2014-06-15 MED ORDER — METOCLOPRAMIDE HCL 5 MG/ML IJ SOLN
INTRAMUSCULAR | Status: AC
  Filled 2014-06-15: qty 2

## 2014-06-15 SURGICAL SUPPLY — 39 items
ADH SKN CLS APL DERMABOND .7 (GAUZE/BANDAGES/DRESSINGS) ×1
CLAMP CORD UMBIL (MISCELLANEOUS) IMPLANT
CLOTH BEACON ORANGE TIMEOUT ST (SAFETY) ×2 IMPLANT
CONTAINER PREFILL 10% NBF 15ML (MISCELLANEOUS) IMPLANT
DERMABOND ADVANCED (GAUZE/BANDAGES/DRESSINGS) ×1
DERMABOND ADVANCED .7 DNX12 (GAUZE/BANDAGES/DRESSINGS) IMPLANT
DRAPE SHEET LG 3/4 BI-LAMINATE (DRAPES) IMPLANT
DRSG OPSITE POSTOP 4X10 (GAUZE/BANDAGES/DRESSINGS) ×3 IMPLANT
DURAPREP 26ML APPLICATOR (WOUND CARE) ×2 IMPLANT
ELECT REM PT RETURN 9FT ADLT (ELECTROSURGICAL) ×2
ELECTRODE REM PT RTRN 9FT ADLT (ELECTROSURGICAL) ×1 IMPLANT
EXTRACTOR VACUUM M CUP 4 TUBE (SUCTIONS) IMPLANT
GLOVE BIO SURGEON STRL SZ7.5 (GLOVE) ×2 IMPLANT
GOWN STRL REUS W/TWL LRG LVL3 (GOWN DISPOSABLE) ×4 IMPLANT
KIT ABG SYR 3ML LUER SLIP (SYRINGE) IMPLANT
NDL HYPO 25X5/8 SAFETYGLIDE (NEEDLE) IMPLANT
NDL SPNL 20GX3.5 QUINCKE YW (NEEDLE) IMPLANT
NEEDLE HYPO 22GX1.5 SAFETY (NEEDLE) ×2 IMPLANT
NEEDLE HYPO 25X5/8 SAFETYGLIDE (NEEDLE) IMPLANT
NEEDLE SPNL 20GX3.5 QUINCKE YW (NEEDLE) IMPLANT
NS IRRIG 1000ML POUR BTL (IV SOLUTION) ×2 IMPLANT
PACK C SECTION WH (CUSTOM PROCEDURE TRAY) ×2 IMPLANT
PAD ABD 7.5X8 STRL (GAUZE/BANDAGES/DRESSINGS) ×1 IMPLANT
PAD OB MATERNITY 4.3X12.25 (PERSONAL CARE ITEMS) ×2 IMPLANT
SPONGE GAUZE 4X4 12PLY STER LF (GAUZE/BANDAGES/DRESSINGS) ×2 IMPLANT
STAPLER VISISTAT 35W (STAPLE) IMPLANT
SUT MNCRL 0 VIOLET CTX 36 (SUTURE) ×2 IMPLANT
SUT MNCRL AB 3-0 PS2 27 (SUTURE) IMPLANT
SUT MON AB 2-0 CT1 27 (SUTURE) ×2 IMPLANT
SUT MON AB-0 CT1 36 (SUTURE) ×4 IMPLANT
SUT MONOCRYL 0 CTX 36 (SUTURE) ×2
SUT PLAIN 0 NONE (SUTURE) IMPLANT
SUT PLAIN 2 0 (SUTURE)
SUT PLAIN 2 0 XLH (SUTURE) IMPLANT
SUT PLAIN ABS 2-0 CT1 27XMFL (SUTURE) IMPLANT
SYR 20CC LL (SYRINGE) IMPLANT
SYR CONTROL 10ML LL (SYRINGE) ×2 IMPLANT
TOWEL OR 17X24 6PK STRL BLUE (TOWEL DISPOSABLE) ×2 IMPLANT
TRAY FOLEY CATH SILVER 14FR (SET/KITS/TRAYS/PACK) ×2 IMPLANT

## 2014-06-15 NOTE — Op Note (Signed)
Cesarean Section Procedure Note  Indications: previous uterine incision kerr x2  Pre-operative Diagnosis: 39 week 3 day pregnancy.  Post-operative Diagnosis: same  Surgeon: Lenoard AdenAAVON,Ethanjames Fontenot J   Assistants: Kathi LudwigBailey, CNM , FACM  Anesthesia: Local anesthesia 0.25.% bupivacaine and Spinal anesthesia  ASA Class: 2  Procedure Details  The patient was seen in the Holding Room. The risks, benefits, complications, treatment options, and expected outcomes were discussed with the patient.  The patient concurred with the proposed plan, giving informed consent. The risks of anesthesia, infection, bleeding and possible injury to other organs discussed. Injury to bowel, bladder, or ureter with possible need for repair discussed. Possible need for transfusion with secondary risks of hepatitis or HIV acquisition discussed. Post operative complications to include but not limited to DVT, PE and Pneumonia noted. The site of surgery properly noted/marked. The patient was taken to Operating Room # 9, identified as [name] and the procedure verified as C-Section Delivery. A Time Out was held and the above information confirmed.  After induction of anesthesia, the patient was draped and prepped in the usual sterile manner. A Pfannenstiel incision was made and carried down through the subcutaneous tissue to the fascia. Fascial incision was made and extended transversely using Mayo scissors. The fascia was separated from the underlying rectus tissue superiorly and inferiorly. The peritoneum was identified and entered. Peritoneal incision was extended longitudinally. The utero-vesical peritoneal reflection was incised transversely and the bladder flap was bluntly freed from the lower uterine segment. A low transverse uterine incision(Kerr hysterotomy) was made. Delivered from ROT presentation was a  female with Apgar scores of 9 at one minute and 9 at five minutes. Bulb suctioning gently performed. Neonatal team in  attendance.After the umbilical cord was clamped and cut cord blood was obtained for evaluation. The placenta was removed intact and appeared normal. The uterus was curetted with a dry lap pack. Good hemostasis was noted.The uterine outline, right tube and ovary appeared normal. Left tube and ovary congenitally absent. The uterine incision was closed with running locked sutures of 0 Monocryl x 1 layers. Hemostasis was observed. The parietal peritoneum was closed with a running 2-0 Monocryl suture. The fascia was then reapproximated with running sutures of 0 Monocryl. The skin was reapproximated with 3-0 monocryl after [place] closure with 2-0 plain.  Instrument, sponge, and needle counts were correct prior the abdominal closure and at the conclusion of the case.   Findings: FLTF, post placenta  Estimated Blood Loss:  300 mL         Drains: foley                 Specimens: placenta and right tubal segment                 Complications:  None; patient tolerated the procedure well.         Disposition: PACU - hemodynamically stable.         Condition: stable  Attending Attestation: I performed the procedure.

## 2014-06-15 NOTE — Transfer of Care (Signed)
Immediate Anesthesia Transfer of Care Note  Patient: [name]  Procedure(s) Performed: Procedure(s) with comments: REPEAT CESAREAN SECTION WITH BILATERAL TUBAL LIGATION (Bilateral) - EDD: 06/19/14  Patient Location: PACU  Anesthesia Type:Spinal  Level of Consciousness: awake, alert  and oriented  Airway & Oxygen Therapy: Patient Spontanous Breathing  Post-op Assessment: Report given to RN and Post -op Vital signs reviewed and stable  Post vital signs: Reviewed and stable  Last Vitals:  Filed Vitals:   06/15/14 0758  BP: 123/73  Resp: 18    Complications: No apparent anesthesia complications

## 2014-06-15 NOTE — Anesthesia Preprocedure Evaluation (Signed)
Anesthesia Evaluation  Patient identified by MRN, date of birth, ID band Patient awake    Reviewed: Allergy & Precautions, H&P , Patient's Chart, lab work & pertinent test results  Airway Mallampati: II  TM Distance: >3 FB Neck ROM: full    Dental no notable dental hx.    Pulmonary  breath sounds clear to auscultation  Pulmonary exam normal       Cardiovascular Exercise Tolerance: Good Rhythm:regular Rate:Normal     Neuro/Psych    GI/Hepatic   Endo/Other  diabetesMorbid obesity  Renal/GU      Musculoskeletal   Abdominal   Peds  Hematology   Anesthesia Other Findings   Reproductive/Obstetrics                             Anesthesia Physical Anesthesia Plan  ASA: II  Anesthesia Plan: Spinal   Post-op Pain Management:    Induction:   Airway Management Planned:   Additional Equipment:   Intra-op Plan:   Post-operative Plan:   Informed Consent: I have reviewed the patients History and Physical, chart, labs and discussed the procedure including the risks, benefits and alternatives for the proposed anesthesia with the patient or authorized representative who has indicated his/her understanding and acceptance.   Dental Advisory Given  Plan Discussed with: CRNA  Anesthesia Plan Comments: (Lab work confirmed with CRNA in room. Platelets okay. Discussed spinal anesthetic, and patient consents to the procedure:  included risk of possible headache,backache, failed block, allergic reaction, and nerve injury. This patient was asked if she had any questions or concerns before the procedure started. )        Anesthesia Quick Evaluation

## 2014-06-15 NOTE — Anesthesia Procedure Notes (Signed)
Spinal Patient location during procedure: OR Preanesthetic Checklist Completed: patient identified, site marked, surgical consent, pre-op evaluation, timeout performed, IV checked, risks and benefits discussed and monitors and equipment checked Spinal Block Patient position: sitting Prep: DuraPrep Patient monitoring: cardiac monitor, continuous pulse ox, blood pressure and heart rate Approach: midline Location: L3-4 Injection technique: catheter Needle Needle type: Tuohy and Sprotte  Needle gauge: 24 G Needle length: 12.7 cm Needle insertion depth: 7 cm Catheter type: closed end flexible Catheter size: 19 g Catheter at skin depth: 14 cm Assessment Sensory level: T4 Additional Notes Spinal Dosage in OR  Bupivicaine ml       1.3 PFMS04   mcg        100 Fentanyl mcg            25

## 2014-06-15 NOTE — Progress Notes (Signed)
Patient seen and examined. Consent witnessed and signed. No changes noted. Update completed. 

## 2014-06-16 LAB — CBC
HEMATOCRIT: 36.2 % (ref 36.0–46.0)
Hemoglobin: 12.3 g/dL (ref 12.0–15.0)
MCH: 31.3 pg (ref 26.0–34.0)
MCHC: 34 g/dL (ref 30.0–36.0)
MCV: 92.1 fL (ref 78.0–100.0)
Platelets: 124 10*3/uL — ABNORMAL LOW (ref 150–400)
RBC: 3.93 MIL/uL (ref 3.87–5.11)
RDW: 13.7 % (ref 11.5–15.5)
WBC: 10.1 10*3/uL (ref 4.0–10.5)

## 2014-06-16 NOTE — Progress Notes (Signed)
Acknowlded order for social work consult regarding mother's hx of panic attacks  Referral is screened out by Clinical Social Worker because none of the following criteria appear to apply: -History of anxiety/depression during this pregnancy, or of post-partum depression. - Diagnosis of anxiety and/or depression within last 3 years or -MOB's symptoms are currently being treated with medication and/or therapy.  CSW completed chart review and is screening out referral at this time.   CSW consulted with MOB's RN who stated that the MOB is doing well and does not present with any symptoms at this time.  Met briefly with MOB and informed that she suffered from  panic attacks 5 years ago that was directly related to her employment.  No acute social concerns related at this time.  Please contact the Clinical Social Worker if needs arise or upon MOB request.

## 2014-06-16 NOTE — Progress Notes (Signed)
POSTOPERATIVE DAY # 1 S/P Repeat C/S   S:         Reports feeling good, minimal pain              Tolerating po intake / no nausea / no vomiting / no flatus / no BM             Bleeding is light             Pain controlled with Motrin and Percocet             Up ad lib / ambulatory/ voiding QS  Newborn breast feeding - going well; latching well   O:  VS: BP 109/53 mmHg  Pulse 64  Temp(Src) 98.4 F (36.9 C) (Oral)  Resp 16  SpO2 97%  Breastfeeding? Unknown   LABS:               Recent Labs  06/14/14 1045 06/16/14 0628  WBC 10.8* 10.1  HGB 13.9 12.3  PLT 146* 124*               Bloodtype: --/--/O NEG, O NEG (04/01 1045)  Rubella: Immune (08/24 0000)                                             I&O: Intake/Output      04/02 0701 - 04/03 0700 04/03 0701 - 04/04 0700   P.O. 720    I.V. 3739.6    Total Intake 4459.6     Urine 4950 500   Blood 500    Total Output 5450 500   Net -990.4 -500                     Physical Exam:             Alert and Oriented X3  Lungs: Clear and unlabored  Heart: regular rate and rhythm / no mumurs  Abdomen: soft, non-tender, non-distended, active bowel sounds, small, unopened 3mm blister on far right lower quadrant from pressure dressing, soft to touch, no fluid palpated in blister             Fundus: firm, non-tender, U-1             Dressing: honey comb dressing clean /dry/ intact              Incision:  approximated with sutures / no erythema / no ecchymosis / no drainage  Perineum: intact  Lochia: scant  Extremities: trace dependent edema, no calf pain or tenderness,   A:        POD # 1 S/P Repeat C/S            Class A2 Gestational DM  - Delivered, CBGs stable  Hypothyroidism - on Levothyroxine 100mcg, stable   Rh Negative - Newborn Rh negative - no Rhogam indicated  P:        Routine postoperative care              Post-op Anticipatory guidance reviewed  May want early discharge tomorrow, otherwise plan to discharge  4/5  Shower today   Milinda CaveMeredith Siona Coulston, [place]

## 2014-06-16 NOTE — Anesthesia Postprocedure Evaluation (Signed)
Anesthesia Post Note  Patient: [name]  Procedure(s) Performed: Procedure(s) (LRB): REPEAT CESAREAN SECTION WITH BILATERAL TUBAL LIGATION (Bilateral)  Anesthesia type: Epidural  Patient location: Mother/Baby  Post pain: Pain level well controlled, no narcotics needed.  Post assessment: Post-op Vital signs reviewed  Last Vitals:  Filed Vitals:   06/16/14 0500  BP: 103/53  Pulse: 73  Temp: 36.8 C  Resp: 20    Post vital signs: Reviewed  Level of consciousness:alert  Complications: No apparent anesthesia complications

## 2014-06-16 NOTE — Lactation Note (Signed)
This note was copied from the chart of [name]. Lactation Consultation Note  Patient Name: [name] Today's Date: 06/16/2014 Reason for consult: Initial assessment   Initial consult at 9829 hours old; infant asleep in crib.  GA 39.2; BW 7#,11 oz; weight loss of 3% within first 14 hours after birth but infant had 4 voids and 5 stools before infant was weighed at midnight helping explain 3% weight loss in first 24 hours.  Mom P3; experienced x2 previous children 1 year each.  AMA Mom [age] yo) history of hypothyroidism.   Infant has breastfed x6 (20-60 min) + 5 (0-5 min) in past 24 hours; voids-2 in past 24 hours/ 5 life; stools-8 in past 24 hrs & life. Mom reports infant is breastfeeding well but reports some pain with breastfeeding.  [facility] and stated she would like to call at next latching for a latch check. Lactation brochure given and informed of outpatient services and hospital support group.  Encouraged mom to call for assistance as needed and to call for latch check.  Mom received phone call, so LC was unable to inquire about hypothyroidism at this time.   Maternal Data Formula Feeding for Exclusion: No Has patient been taught Hand Expression?:  (mom states she knows how to hand express milk) Does the patient have breastfeeding experience prior to this delivery?: Yes  Feeding Feeding Type: Breast Fed Length of feed: 5 min  LATCH Score/Interventions                      Lactation Tools Discussed/Used WIC Program: No Pump Review: Milk Storage   Consult Status Consult Status: Follow-up Date: 06/17/14 Follow-up type: In-patient    [name], [name] 06/16/2014, 3:13 PM

## 2014-06-17 ENCOUNTER — Encounter (HOSPITAL_COMMUNITY): Payer: Self-pay | Admitting: Obstetrics and Gynecology

## 2014-06-17 DIAGNOSIS — D696 Thrombocytopenia, unspecified: Secondary | ICD-10-CM

## 2014-06-17 DIAGNOSIS — O24419 Gestational diabetes mellitus in pregnancy, unspecified control: Secondary | ICD-10-CM

## 2014-06-17 DIAGNOSIS — O99119 Other diseases of the blood and blood-forming organs and certain disorders involving the immune mechanism complicating pregnancy, unspecified trimester: Secondary | ICD-10-CM

## 2014-06-17 DIAGNOSIS — E039 Hypothyroidism, unspecified: Secondary | ICD-10-CM | POA: Diagnosis present

## 2014-06-17 HISTORY — DX: Thrombocytopenia, unspecified: D69.6

## 2014-06-17 HISTORY — DX: Gestational diabetes mellitus in pregnancy, unspecified control: O24.419

## 2014-06-17 LAB — BIRTH TISSUE RECOVERY COLLECTION (PLACENTA DONATION)

## 2014-06-17 MED ORDER — OXYCODONE-ACETAMINOPHEN 5-325 MG PO TABS
1.0000 | ORAL_TABLET | ORAL | Status: DC | PRN
Start: 2014-06-17 — End: 2018-05-24

## 2014-06-17 MED ORDER — IBUPROFEN 600 MG PO TABS: 600.0000 mg | ORAL_TABLET | Freq: Four times a day (QID) | ORAL | Status: DC

## 2014-06-17 NOTE — Discharge Summary (Signed)
DISCHARGE SUMMARY:  Patient ID: [name] MRN: [id number] DOB/AGE: 1975/03/19 39 y.o.  Admit date: 06/15/2014 Admission Diagnoses: 39.[redacted] weeks gestation, previous CS, desired sterilization   Discharge date: 06/17/2014 Discharge Diagnoses: S/P C/S on 06/15/14; A2GDM, delivered; mild thrombocytopenia        Prenatal history: G3P3003   EDC: 06/19/2014, by Other Basis  Prenatal care at [facility] since [redacted] wks gestation. Primary provider: Dr. Billy Coast Prenatal course complicated by AMA, A2GDM, previous CS, and Hypothyroidism  Prenatal labs: ABO, Rh: --/--/O NEG, O NEG (04/01 1045)  Rubella:   Immune RPR: Non Reactive (04/01 1045)  HBsAg: Negative (08/24 0000)  HIV: Non-reactive (08/24 0000)  GBS:   Neg GTT: n/a  Medical / Surgical History :  Past medical history:  Past Medical History  Diagnosis Date  . Thyroid disease     hypothyroidism  . Hemorrhoids   . Back pain   . Gestational diabetes 2016  . Hypothyroidism   . Chronic headaches     with first trimester - otc med prn    Past surgical history:  Past Surgical History  Procedure Laterality Date  . Cesarean section  2007 & 2010    x 2 - at [facility]  . Wisdom teeth ext       Medications on Admission: Prescriptions prior to admission  Medication Sig Dispense Refill Last Dose  . calcium carbonate (TUMS - DOSED IN MG ELEMENTAL CALCIUM) 500 MG chewable tablet Chew 2 tablets by mouth daily as needed for indigestion or heartburn.   Past Month at Unknown time  . glyBURIDE (DIABETA) 1.25 MG tablet Take 1.25 mg by mouth 2 (two) times daily.   06/14/2014 at 0630  . levothyroxine (SYNTHROID, LEVOTHROID) 100 MCG tablet Take 100 mcg by mouth daily before breakfast.   06/15/2014 at 0630  . Prenatal Vit-Fe Fumarate-FA (PRENATAL MULTIVITAMIN) TABS tablet Take 1 tablet by mouth daily at 12 noon.   06/14/2014 at 2300  . acetaminophen (TYLENOL) 500 MG tablet Take 1,000 mg by mouth every 6 (six) hours as needed for mild pain or  headache.   More than a month at Unknown time    Allergies: Review of patient's allergies indicates no known allergies.   Intrapartum Course:  Admitted for scheduled repeat CS under regional anesthesia.   Postpartum Course: Complicated by mild thrombocytopenia.  Physical Exam:   VSS: Blood pressure 124/70, pulse 67, temperature 98.2 F (36.8 C), temperature source Oral, resp. rate 18, SpO2 97 %, unknown if currently breastfeeding.  LABS:  Recent Labs  06/14/14 1045 06/16/14 0628  WBC 10.8* 10.1  HGB 13.9 12.3  PLT 146* 124*    General: alert and oriented x3 Heart: RRR Lungs: CTA bilaterally GI: soft, non-tender, non-distended, BS x4 Lochia: small Uterus: firm below umbilicus Incision: well approximated; honeycomb dressing-no significant erythema, drainage, or edema Extremities: trace BLE edema, Homans neg   Newborn Data Live born female  Birth Weight: 7 lb 11 oz (3487 g) APGAR: 9, 10  See operative report for further details  Home with mother.  Discharge Instructions:  Wound Care: keep clean and dry / remove honeycomb POD 6 Postpartum Instructions: [facility] discharge booklet - instructions reviewed Medications:    Medication List    STOP taking these medications        acetaminophen 500 MG tablet  Commonly known as:  TYLENOL     calcium carbonate 500 MG chewable tablet  Commonly known as:  TUMS - dosed in mg elemental calcium  glyBURIDE 1.25 MG tablet  Commonly known as:  DIABETA      TAKE these medications        ibuprofen 600 MG tablet  Commonly known as:  ADVIL,MOTRIN  Take 1 tablet (600 mg total) by mouth every 6 (six) hours.     levothyroxine 100 MCG tablet  Commonly known as:  SYNTHROID, LEVOTHROID  Take 100 mcg by mouth daily before breakfast.     oxyCODONE-acetaminophen 5-325 MG per tablet  Commonly known as:  PERCOCET/ROXICET  Take 1-2 tablets by mouth every 4 (four) hours as needed (for pain scale greater than 7).      prenatal multivitamin Tabs tablet  Take 1 tablet by mouth daily at 12 noon.            Follow-up Information    Follow up with Lenoard AdenAAVON,RICHARD J, MD. Schedule an appointment as soon as possible for a visit in 6 weeks.   Specialty:  Obstetrics and Gynecology   Contact information:   [address] [place] [address] [phone]         Signed: Donette LarryBHAMBRI, Rockland Kotarski, Dorris CarnesN MSN, CNM 06/17/2014, 9:50 AM

## 2014-06-17 NOTE — Progress Notes (Addendum)
POD #2  Subjective: Pt reports feeling well, desires early discharge/ Pain controlled with Motrin and Percocet Tolerating po/Voiding without problems/ No n/v/ Flatus present, +BM Activity: ad lib Bleeding is light Newborn info:  Information for the patient's newborn:  Natale MilchHolbrook, Girl Ily [[id number]][[id number]]  female Feeding: breast   Objective: VS: VS:  Filed Vitals:   06/16/14 0500 06/16/14 0915 06/16/14 1748 06/17/14 0619  BP: 103/53 109/53 124/61 124/70  Pulse: 73 64 74 67  Temp: 98.2 F (36.8 C) 98.4 F (36.9 C) 98.5 F (36.9 C) 98.2 F (36.8 C)  TempSrc: Oral Oral Oral Oral  Resp: 20 16 18 18   SpO2: 98% 97%      I&O: Intake/Output      04/03 0701 - 04/04 0700 04/04 0701 - 04/05 0700   P.O. 840    I.V.     Total Intake 840     Urine 1350    Blood     Total Output 1350     Net -510            LABS:  Recent Labs  06/14/14 1045 06/16/14 0628  WBC 10.8* 10.1  HGB 13.9 12.3  PLT 146* 124*   Blood type: --/--/O NEG, O NEG (04/01 1045) Rubella: Immune (08/24 0000)           Physical Exam:  General: alert and cooperative CV: Regular rate and rhythm Resp: CTA bilaterally Abdomen: soft, nontender, normal bowel sounds Incision: healing well, no drainage, no erythema, no hernia, no seroma, no swelling, well approximated with suture. Honeycomb dsg c/d/i. Uterine Fundus: firm, below umbilicus, nontender Lochia: minimal Ext: edema trace BLE and Homans sign is negative, no sign of DVT    Assessment: POD #2/ G3P3003/ S/P C/Section & BTL d/t repeat  A2GDM, delivered-stable Hypothyroidism-stable on Synthroid Rh negative-infant Rh neg Mild thrombocytopenia-stable Doing well and stable for discharge home  Plan: Discharge home RX's: Ibuprofen 600mg  po Q 6 hrs prn pain #30 Refill x 1 Percocet 5/325 1 - 2 tabs po every 4 hrs prn pain #30 Refill x 0 [facility] booklet given Postpartum visit @[facility] -6 wks GTT in 6-12 wks @[facility]     Signed: Donette LarryBHAMBRI, Trampus Mcquerry, N,  MSN, CNM 06/17/2014, 9:47 AM

## 2014-06-18 LAB — TYPE AND SCREEN
ABO/RH(D): O NEG
Antibody Screen: NEGATIVE
UNIT DIVISION: 0
UNIT DIVISION: 0

## 2016-03-18 DIAGNOSIS — Z8639 Personal history of other endocrine, nutritional and metabolic disease: Secondary | ICD-10-CM | POA: Diagnosis not present

## 2016-09-05 DIAGNOSIS — J029 Acute pharyngitis, unspecified: Secondary | ICD-10-CM | POA: Diagnosis not present

## 2017-03-14 DIAGNOSIS — Z01419 Encounter for gynecological examination (general) (routine) without abnormal findings: Secondary | ICD-10-CM | POA: Diagnosis not present

## 2017-03-14 DIAGNOSIS — Z130 Encounter for screening for diseases of the blood and blood-forming organs and certain disorders involving the immune mechanism: Secondary | ICD-10-CM | POA: Diagnosis not present

## 2017-03-14 DIAGNOSIS — Z6841 Body Mass Index (BMI) 40.0 and over, adult: Secondary | ICD-10-CM | POA: Diagnosis not present

## 2017-03-14 DIAGNOSIS — Z131 Encounter for screening for diabetes mellitus: Secondary | ICD-10-CM | POA: Diagnosis not present

## 2017-03-14 DIAGNOSIS — Z8639 Personal history of other endocrine, nutritional and metabolic disease: Secondary | ICD-10-CM | POA: Diagnosis not present

## 2018-04-05 DIAGNOSIS — Z8639 Personal history of other endocrine, nutritional and metabolic disease: Secondary | ICD-10-CM | POA: Diagnosis not present

## 2018-04-05 DIAGNOSIS — Z6841 Body Mass Index (BMI) 40.0 and over, adult: Secondary | ICD-10-CM | POA: Diagnosis not present

## 2018-04-05 DIAGNOSIS — Z803 Family history of malignant neoplasm of breast: Secondary | ICD-10-CM | POA: Diagnosis not present

## 2018-04-05 DIAGNOSIS — Z01419 Encounter for gynecological examination (general) (routine) without abnormal findings: Secondary | ICD-10-CM | POA: Diagnosis not present

## 2018-04-18 ENCOUNTER — Encounter: Payer: Self-pay | Admitting: Genetic Counselor

## 2018-04-18 ENCOUNTER — Telehealth: Payer: Self-pay | Admitting: Genetic Counselor

## 2018-04-18 NOTE — Telephone Encounter (Signed)
A genetic counseling appt has been scheduled for the pt to see Maylon Cos on 2/9 at 9am. Pt aware to arrive early to be checked in on time.

## 2018-04-21 ENCOUNTER — Encounter: Payer: Self-pay | Admitting: Genetic Counselor

## 2018-04-21 ENCOUNTER — Telehealth: Payer: Self-pay | Admitting: Licensed Clinical Social Worker

## 2018-04-21 NOTE — Telephone Encounter (Signed)
Pt cld to reschedule her genetic counseling appt due to family illness. Pt has been rescheduled to see Lacy Duverney on 2/20 at 9am.

## 2018-04-24 ENCOUNTER — Encounter: Payer: BLUE CROSS/BLUE SHIELD | Admitting: Genetic Counselor

## 2018-05-03 ENCOUNTER — Telehealth: Payer: Self-pay | Admitting: Licensed Clinical Social Worker

## 2018-05-04 ENCOUNTER — Inpatient Hospital Stay: Payer: BLUE CROSS/BLUE SHIELD

## 2018-05-04 ENCOUNTER — Inpatient Hospital Stay: Payer: BLUE CROSS/BLUE SHIELD | Attending: Genetic Counselor | Admitting: Licensed Clinical Social Worker

## 2018-05-04 ENCOUNTER — Encounter: Payer: Self-pay | Admitting: Licensed Clinical Social Worker

## 2018-05-04 DIAGNOSIS — Z1501 Genetic susceptibility to malignant neoplasm of breast: Secondary | ICD-10-CM | POA: Insufficient documentation

## 2018-05-04 DIAGNOSIS — Z1509 Genetic susceptibility to other malignant neoplasm: Principal | ICD-10-CM

## 2018-05-04 DIAGNOSIS — Z803 Family history of malignant neoplasm of breast: Secondary | ICD-10-CM | POA: Insufficient documentation

## 2018-05-04 NOTE — Telephone Encounter (Signed)
Called patient to ask some clarifying questions before her appointment.

## 2018-05-04 NOTE — Progress Notes (Signed)
REFERRING PROVIDER: Brien Few, MD [address] [place], [place] [address]  PRIMARY PROVIDER:  Brien Few, MD  PRIMARY REASON FOR VISIT:  1. BRCA1 gene mutation positive   2. Family history of breast cancer      HISTORY OF PRESENT ILLNESS:   [name], a 43 y.o. female, was seen for a [facility] cancer genetics consultation at the request of Dr. Ronita Hipps due her recent genetic testing through his office which revealed the known familial BRCA1 mutation.  [name] presents to clinic today to discuss her results and to further clarify her future cancer risks, as well as potential cancer risks for family members.   [name] is a 43 y.o. female with no personal history of cancer.    HORMONAL RISK FACTORS:  Menarche was at age 60.  First live birth at age 43.  OCP use for approximately 4 years.  Ovaries intact: yes- she reports she was born with only one ovary. Hysterectomy: no.  Menopausal status: premenopausal.  HRT use: 0 years. Colonoscopy: yes; normal. Mammogram within the last year: yes. Number of breast biopsies: 0.  Past Medical History:  Diagnosis Date  . Back pain   . BRCA1 gene mutation positive   . Chronic headaches    with first trimester - otc med prn  . Family history of breast cancer   . Gestational diabetes 2016  . Hemorrhoids   . Hypothyroidism   . Thyroid disease    hypothyroidism    Past Surgical History:  Procedure Laterality Date  . CESAREAN SECTION  2007 & 2010   x 2 - at [facility]  . CESAREAN SECTION WITH BILATERAL TUBAL LIGATION Bilateral 06/15/2014   Procedure: REPEAT CESAREAN SECTION WITH BILATERAL TUBAL LIGATION;  Surgeon: Brien Few, MD;  Location: [facility];  Service: Obstetrics;  Laterality: Bilateral;  EDD: 06/19/14  . wisdom teeth ext      Social History   Socioeconomic History  . Marital status: Married    Spouse name: Not on file  . Number of children: Not on file  . Years of education: Not on file  . Highest  education level: Not on file  Occupational History  . Not on file  Social Needs  . Financial resource strain: Not on file  . Food insecurity:    Worry: Not on file    Inability: Not on file  . Transportation needs:    Medical: Not on file    Non-medical: Not on file  Tobacco Use  . Smoking status: Never Smoker  . Smokeless tobacco: Never Used  Substance and Sexual Activity  . Alcohol use: No  . Drug use: No  . Sexual activity: Yes    Birth control/protection: None    Comment: pregnant  Lifestyle  . Physical activity:    Days per week: Not on file    Minutes per session: Not on file  . Stress: Not on file  Relationships  . Social connections:    Talks on phone: Not on file    Gets together: Not on file    Attends religious service: Not on file    Active member of club or organization: Not on file    Attends meetings of clubs or organizations: Not on file    Relationship status: Not on file  Other Topics Concern  . Not on file  Social History Narrative  . Not on file     FAMILY HISTORY:  We obtained a detailed, 4-generation family history.  Significant diagnoses  are listed below: Family History  Problem Relation Age of Onset  . Diabetes Mother   . Breast cancer Mother        dx 86 and 70, BRCA1+  . Kidney disease Father   . Heart disease Father   . Hypertension Father   . Cancer Maternal Grandmother        breast dx 76s, d. 69s  . Breast cancer Paternal Aunt        dx 51s  . Leukemia Cousin        dx childhood   Ms. Eckford has 2 sons, ages 55 and 77, and a daughter, age 19. She also has one sister who is 10. This sister is a high Public relations account executive and does not have children, and has expressed to the patient that she does not want testing.   [name]'s mother was diagnosed with breast cancer at 97 and again at 74. She had genetic testing in 2008 through our clinic which revealed a BRCA1 mutation. The patient brought this report to the session. The  patient's mother is living at 43. [name] has 2 maternal uncles, both living in their 35s and cancer free, and a maternal aunt, living in her 54s and cancer free. One of the uncles did test positive for the BRCA1 familial mutation, and his daughter (patient's cousin) tested positive as well and was diagnosed with breast cancer in her 67s. [name] maternal grandmother had breast cancer twice in her 44s and passed away in her 30s. Her maternal grandfather is deceased, and had two other children but [name] does not have information about them.  [name]'s father passed away at 81, he did not have cancer. [name] had 3 paternal aunts, 5 paternal uncles. One of her aunts did have breast cancer in her 59's. One of her first cousins's once removed had leukemia in his childhood. No other cancers in her paternal aunts/uncles/cousins. Her paternal grandfather died in his 108's, paternal grandmother died at 47.  [name] is aware of previous family history of genetic testing for hereditary cancer risks. Patient's maternal ancestors are of Caucasian descent, and paternal ancestors are of Caucasian descent. There is no reported Ashkenazi Jewish ancestry. There is no known consanguinity.  GENETIC TESTING: [name] had genetic testing through Dr. Kennith Maes office. She brought her report to the session. She had testing through [facility]'s Multi-Cancer Panel. This testing did reveal the known familial pathogenic variant, BRCA1 c.213-11T>G (Intronic).  The Multi-Cancer Panel offered by [facility] includes sequencing and/or deletion duplication testing of the following 84 genes: AIP, ALK, APC, ATM, AXIN2,BAP1,  BARD1, BLM, BMPR1A, BRCA1, BRCA2, BRIP1, CASR, CDC73, CDH1, CDK4, CDKN1B, CDKN1C, CDKN2A (p14ARF), CDKN2A (p16INK4a), CEBPA, CHEK2, CTNNA1, DICER1, DIS3L2, EGFR (c.2369C>T, p.Thr790Met variant only), EPCAM (Deletion/duplication testing only), FH, FLCN, GATA2, GPC3, GREM1 (Promoter region  deletion/duplication testing only), HOXB13 (c.251G>A, p.Gly84Glu), HRAS, KIT, MAX, MEN1, MET, MITF (c.952G>A, p.Glu318Lys variant only), MLH1, MSH2, MSH3, MSH6, MUTYH, NBN, NF1, NF2, NTHL1, PALB2, PDGFRA, PHOX2B, PMS2, POLD1, POLE, POT1, PRKAR1A, PTCH1, PTEN, RAD50, RAD51C, RAD51D, RB1, RECQL4, RET, RUNX1, SDHAF2, SDHA (sequence changes only), SDHB, SDHC, SDHD, SMAD4, SMARCA4, SMARCB1, SMARCE1, STK11, SUFU, TERC, TERT, TMEM127, TP53, TSC1, TSC2, VHL, WRN and WT1. The report date is 04/12/2018.   The test report will be scanned into EPIC and will be located under the Molecular Pathology section of the Results Review tab. A portion of the result report is included below for reference.      GENETIC COUNSELING ASSESSMENT:  Ms. Cathell is a 43 year old female with who tested positive for the known familial BRCA1 pathogenic variant. We, therefore, discussed and recommended the following at today's visit.   DISCUSSION: We discussed her test result in detail, including the inheritance of BRCA1, cancers associated with BRCA1, current management guidelines and implications for family members:  Cancers:  HBOC syndrome is characterized by an increased lifetime risk for generally adult-onset cancers including breast, contralateral breast, female breast, ovarian, prostate, and pancreatic (PMID: 16967893).  The cancers associated with BRCA1 are:  Breast cancer, up to a 87% risk (PMID: 8101751, 02585277) Ovarian cancer, up to a 54% risk (PMID: 8242353, 61443154) Pancreatic cancer, 1-3% (PMID: 00867619, 50932671, 24580998, 33825053) Prostate cancer, elevated (97673419, 37902409)  Inheritance:  Hereditary predisposition to cancer due to pathogenic variants in the BRCA1 gene has autosomal dominant inheritance. This means that an individual with a pathogenic variant has a 50% chance of passing the condition on to his/her offspring. Most cases are inherited from a parent, but some cases may occur spontaneously  (i.e., an individual with a pathogenic variant has parents who do not have it). Identification of a pathogenic variant allows for the recognition of at-risk relatives who can pursue testing for the familial variant.  Management:  Management guidelines for individuals with BRCA1 pathogenic variants have been developed by the [employer]  (NCCN):  Females:  -Breast awareness starting at age 53 -Clinical breast exams every 6-12 months beginning at age 31 or the age of earliest diagnosed breast cancer in family if earlier than 37 -Annual breast MRIs with contrast beginning between 25-29 -Annual breast MRI with contrast and annual mammography with consideration of tomosynthesis between ages of 66-75 -Consider risk-reducing mastectomy  -Recommend risk-reducing salpingo-oophorectomy, typically between ages 41 and 52 -For patients who have not elected RRSO, transvaginal ultrasound combined with CA-125 for ovarian cancer screening has not been shown to be sufficiently sensitive or specific as to support a positive recommendation, but, it may be considered at clinician's discretion -Consider risk-reducing agents as options for breast and ovarian cancer  Males:  -Breast self-exam training and education at age 35 -Clinical breast exam every 12 months, beginning at 81 -Consider prostate cancer screening beginning at 6   Females and Males  -For pancreatic cancer risk, screening can be considered at age 70 if there is a diagnosis of pancreatic cancer in one of Ms. Bullard's first or second degree relatives. For individuals considering pancreatic cancer screening, it is recommended that screening consist of annual contrast-enhanced MRI/MCRP and/or EUS.  These guidelines are based on current NCCN guidelines (v 1.2020). These guidelines are subject to change and continually updated and should be directly referenced for future medical management.   Additional risk reduction  and management considerations:  -Chemoprevention can reduce the risk of breast cancer in the contralateral breast in women with BRCA1 and BRCA2 mutations who have been diagnosed with breast cancer -Oral contraceptive use has been shown to reduced risk of ovarian cancer by 60% in BRCA mutation carriers if taken for at least 5 years -Recent studies have some preliminary data that suggest PARP inhibitors may be beneficial chemotherapeutic agent for a subset of patients with BRCA-associated breast and ovarian cancers.   An individual's cancer risk and medical management are not determined by genetic test results alone. Overall cancer risk assessment incorporates additional factors including personal medical history, family history, as well as available genetic information that may result in a personalized plan for cancer prevention and surveillance.  FAMILY MEMBERS: It  is important that all of Ms. Birr's relatives (both men and women) know of the presence of this gene mutation. Site-specific genetic testing can sort out who in the family is at risk and who is not.   Ms. Santmyer's children are have a 50% chance to have inherited this mutation. However, they are relatively young and this will not be of any consequence to them for several years. We do not test children because there is no risk to them until they are adults. We recommend they have genetic counseling and testing by the time they are in their early 20s.    PLAN:   1. Ms. Maret will be referred to our high risk breast clinic. She would like Dr. Ronita Hipps to follow her long-term for this indication and coordinate screening/prophylactic surgeries.   2. Ms. Achenbach plans to discuss these results with her family and will reach out to [place] if we can be of any assistance in coordinating genetic testing for any of her relatives.    SUPPORT AND RESOURCES: If Ms. Pidcock is interested in BRCA-specific information and support, there are two  groups, Facing Our Risk (www.facingourrisk.com) and Bright Pink (www.brightpink.org) which some people have found useful. They provide opportunities to speak with other individuals from high-risk families. To locate genetic counselors in other cities, visit the website of the [employer] (ArtistMovie.se) and Secretary/administrator for a Social worker by zip code.  We encouraged Ms. Cunliffe to remain in contact with [place] on an annual basis so we can update her personal and family histories, and let her know of advances in cancer genetics that may benefit the family. Our contact number was provided. Ms. Orrego questions were answered to her satisfaction today, and she knows she is welcome to call anytime with additional questions.   Faith Rogue, MS Genetic Counselor [email or web address] Phone: [phone]   The patient was seen for a total of 35 minutes in face-to-face genetic counseling.

## 2018-05-05 ENCOUNTER — Telehealth: Payer: Self-pay | Admitting: Licensed Clinical Social Worker

## 2018-05-05 NOTE — Telephone Encounter (Signed)
Lft vm to schedule for the high risk breast clinic.

## 2018-05-19 ENCOUNTER — Telehealth: Payer: Self-pay | Admitting: Oncology

## 2018-05-19 NOTE — Telephone Encounter (Signed)
Pt has been scheduled to see Dr. Darnelle Catalan in the high risk breast clinic on 3/11 at 4pm. Pt has been fwd to [facility] regarding cost since she has a high deductible.

## 2018-05-23 NOTE — Progress Notes (Signed)
[place]  Telephone:([phone]) [phone] Fax:(336) [phone]    ID: [name] DOB: [birth year]  MR#: [id number]  UKG#:[id number]  Patient Care Team: Brien Few, MD as PCP - General (Obstetrics and Gynecology) Dago Jungwirth, Virgie Dad, MD as Consulting Physician (Oncology) OTHER MD:   CHIEF COMPLAINT: BRCA1 positive  CURRENT TREATMENT: Intensified screening   HISTORY OF CURRENT ILLNESS: [name] was recently diagnosed as BRCA 1 positive at c.213-11T>G on 04/05/2018 through the [facility] Multi-Cancer Panel.  She is here today to discuss management options.  For a full account of her family history and a pedigree please refer to the genetics counseling note dated 05/04/2018  The patient's subsequent history is as detailed below.   INTERVAL HISTORY: [name] was evaluated in the high risk clinic on 05/24/2018 accompanied by her husband [name].    REVIEW OF SYSTEMS: For exercise, she walks about 5,000 steps per day according to her FitBit. The patient denies unusual headaches, visual changes, nausea, vomiting, stiff neck, dizziness, or gait imbalance. There has been no cough, phlegm production, or pleurisy, no chest pain or pressure, and no change in bowel or bladder habits. The patient denies fever, rash, bleeding, unexplained fatigue or unexplained weight loss. A detailed review of systems was otherwise entirely negative.   PAST MEDICAL HISTORY: Past Medical History:  Diagnosis Date  . Back pain   . BRCA1 gene mutation positive   . Chronic headaches    with first trimester - otc med prn  . Family history of breast cancer   . Gestational diabetes 2016  . Hemorrhoids   . Hypothyroidism   . 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 CESAREAN SECTION  2007 & 2010   x 2 - at [facility]  . CESAREAN SECTION WITH BILATERAL TUBAL LIGATION  Bilateral 06/15/2014   Procedure: REPEAT CESAREAN SECTION WITH BILATERAL TUBAL LIGATION;  Surgeon: Brien Few, MD;  Location: [facility];  Service: Obstetrics;  Laterality: Bilateral;  EDD: 06/19/14  . wisdom teeth ext       FAMILY HISTORY: Family History  Problem Relation Age of Onset  . Diabetes Mother   . Breast cancer Mother        dx 50 and 84, BRCA1+  . Kidney disease Father   . Heart disease Father   . Hypertension Father   . Cancer Maternal Grandmother        breast dx 64s, d. 32s  . Breast cancer Paternal Aunt        dx 27s  . Leukemia Cousin        dx childhood   [name]'s father died from heart disease and kidney failure at age 54. Patients' mother is 79 as of 05/2018, with a history of BRCA1+ and a diagnosis of breast cancer x2. The patient has 1 sister. Patient denies anyone in her family having ovarian, prostate, or pancreatic cancer. [name]'s maternal grandmother was diagnosed with breast cancer. [name]'s maternal uncle and his daughter are also both BRCA1+.    GYNECOLOGIC HISTORY:  No LMP recorded. 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 Her husband, [name], works in Press photographer. They have three children, 13 (05/2005),  9 (07/2009), and 3 (06/2015) as of 05/2018.     ADVANCED DIRECTIVES: Her husband, [name], is automatically her healthcare power of attorney.      HEALTH MAINTENANCE: Social History   Tobacco Use  . Smoking status: Never Smoker  . Smokeless tobacco: Never Used  Substance Use Topics  . Alcohol use: No  . Drug use: No    Colonoscopy: n/a  PAP: up to date  Bone density: n/a Mammography: [facility]. 06/2017.    No Known Allergies  Current Outpatient Medications  Medication Sig Dispense Refill  . levothyroxine (SYNTHROID, LEVOTHROID) 100 MCG tablet  Take 100 mcg by mouth daily before breakfast.     No current facility-administered medications for this visit.      OBJECTIVE: Morbidly obese young white woman who appears stated age  38:   05/24/18 1551  BP: 136/83  Pulse: 88  Resp: 18  Temp: 98.5 F (36.9 C)  SpO2: 100%     Body mass index is 42.43 kg/m.   Wt Readings from Last 3 Encounters:  05/24/18 232 lb (105.2 kg)  06/14/14 220 lb (99.8 kg)  12/27/12 211 lb 9.6 oz (96 kg)      ECOG FS:0 - Asymptomatic  Ocular: Sclerae unicteric, pupils round and equal Lymphatic: No cervical or supraclavicular adenopathy Lungs no rales or rhonchi Heart regular rate and rhythm Abd soft, nontender, positive bowel sounds MSK no focal spinal tenderness, no joint edema Neuro: non-focal, well-oriented, appropriate affect Breasts: No masses palpated in either breast.  There are no skin or nipple changes of concern.  Both axillae are benign.   LAB RESULTS:  CMP     Component Value Date/Time   NA 136 06/14/2014 1045   K 3.7 06/14/2014 1045   CL 106 06/14/2014 1045   CO2 22 06/14/2014 1045   GLUCOSE 116 (H) 06/14/2014 1045   BUN 7 06/14/2014 1045   CREATININE 0.66 06/14/2014 1045   CALCIUM 9.1 06/14/2014 1045   GFRNONAA >90 06/14/2014 1045   GFRAA >90 06/14/2014 1045    No results found for: TOTALPROTELP, ALBUMINELP, A1GS, A2GS, BETS, BETA2SER, GAMS, MSPIKE, SPEI  No results found for: KPAFRELGTCHN, LAMBDASER, KAPLAMBRATIO  Lab Results  Component Value Date   WBC 10.1 06/16/2014   HGB 12.3 06/16/2014   HCT 36.2 06/16/2014   MCV 92.1 06/16/2014   PLT 124 (L) 06/16/2014    _0 @  No results found for: LABCA2  No components found for: ATFTDD220  No results for input(s): INR in the last 168 hours.  No results found for: LABCA2  No results found for: URK270  No results found for: WCB762  No results found for: GBT517  No results found for: CA2729  No components found for: HGQUANT  No results  found for: CEA1 / No results found for: CEA1   No results found for: AFPTUMOR  No results found for: CHROMOGRNA  No results found for: PSA1  No visits with results within 3 Day(s) from this visit.  Latest known visit with results is:  Admission on 06/15/2014, Discharged on 06/17/2014  Component Date Value Ref Range Status  . Order Confirmation 06/15/2014 ORDER PROCESSED BY BLOOD BANK   Final  . Glucose-Capillary 06/15/2014 98  70 - 99 mg/dL Final  . Comment 1 06/15/2014 Document in Chart   Final  . Glucose-Capillary 06/15/2014 93  70 - 99 mg/dL Final  . WBC 06/16/2014 10.1  4.0 - 10.5 K/uL Final  . RBC 06/16/2014 3.93  3.87 - 5.11 MIL/uL Final  .  Hemoglobin 06/16/2014 12.3  12.0 - 15.0 g/dL Final  . HCT 06/16/2014 36.2  36.0 - 46.0 % Final  . MCV 06/16/2014 92.1  78.0 - 100.0 fL Final  . MCH 06/16/2014 31.3  26.0 - 34.0 pg Final  . MCHC 06/16/2014 34.0  30.0 - 36.0 g/dL Final  . RDW 06/16/2014 13.7  11.5 - 15.5 % Final  . Platelets 06/16/2014 124* 150 - 400 K/uL Final  . Placenta donation bld collect 06/17/2014 COLLECTED BY LABORATORY   Final    (this displays the last labs from the last 3 days)  No results found for: TOTALPROTELP, ALBUMINELP, A1GS, A2GS, BETS, BETA2SER, GAMS, MSPIKE, SPEI (this displays SPEP labs)  No results found for: KPAFRELGTCHN, LAMBDASER, KAPLAMBRATIO (kappa/lambda light chains)  No results found for: HGBA, HGBA2QUANT, HGBFQUANT, HGBSQUAN (Hemoglobinopathy evaluation)   No results found for: LDH  No results found for: IRON, TIBC, IRONPCTSAT (Iron and TIBC)  No results found for: FERRITIN  Urinalysis No results found for: COLORURINE, APPEARANCEUR, LABSPEC, PHURINE, GLUCOSEU, HGBUR, BILIRUBINUR, KETONESUR, PROTEINUR, UROBILINOGEN, NITRITE, LEUKOCYTESUR   STUDIES:  No results found.   ELIGIBLE FOR AVAILABLE RESEARCH PROTOCOL: no   ASSESSMENT: 43 y.o. [place], [place] woman positive for a deleterious BRCA1 mutation, BRCA1 c.213-11T>G  (Intronic).  (1) genetics testing 04/12/2018 through the multi-cancer panel offered by in [place] found no additional mutations in AIP, ALK, APC, ATM, AXIN2,BAP1,  BARD1, BLM, BMPR1A, BRCA1, BRCA2, BRIP1, CASR, CDC73, CDH1, CDK4, CDKN1B, CDKN1C, CDKN2A (p14ARF), CDKN2A (p16INK4a), CEBPA, CHEK2, CTNNA1, DICER1, DIS3L2, EGFR (c.2369C>T, p.Thr790Met variant only), EPCAM (Deletion/duplication testing only), FH, FLCN, GATA2, GPC3, GREM1 (Promoter region deletion/duplication testing only), HOXB13 (c.251G>A, p.Gly84Glu), HRAS, KIT, MAX, MEN1, MET, MITF (c.952G>A, p.Glu318Lys variant only), MLH1, MSH2, MSH3, MSH6, MUTYH, NBN, NF1, NF2, NTHL1, PALB2, PDGFRA, PHOX2B, PMS2, POLD1, POLE, POT1, PRKAR1A, PTCH1, PTEN, RAD50, RAD51C, RAD51D, RB1, RECQL4, RET, RUNX1, SDHAF2, SDHA (sequence changes only), SDHB, SDHC, SDHD, SMAD4, SMARCA4, SMARCB1, SMARCE1, STK11, SUFU, TERC, TERT, TMEM127, TP53, TSC1, TSC2, VHL, WRN and WT1.  (2) risk reduction: Patient is considering bilateral mastectomies versus tamoxifen  (3 intensified screening: Plan is for mammography April, breast MRI October and biannual MD breast exam PLAN: I spent approximately 60 minutes face to face with [name] with more than 50% of that time spent in counseling and coordination of care. Specifically we reviewed the biology of the patient's diagnosis and the specifics of her situation.  [name] understands that the BRCA gene codes for an enzyme which corrects DNA mistakes during cells replication.  It stands to reason that more mistakes would be made in DNA copying with a defective proofreader and this leads to increased risk of cancer specifically of the breast and ovary as well as the prostate and pancreas.  We discussed the fact that there is no screening test validated for pancreatic or ovarian cancer.  Fortunately the risk of pancreatic cancer from BRCA mutations is low.  As far as the ovarian cancer is concerned she is planning on unilateral  salpingo-oophorectomy in the near future which I think is the best option for her  That leaves [place] with the risk of breast cancer which could be as high as 60% in her lifetime.  There are 2 ways of dealing with this problem.  One is risk reduction.  The other one is intensified screening.  One way to reduce the risk is bilateral mastectomies.  We discussed this in great detail and she understands the different types of mastectomies as well as the option of no reconstruction  and the various types of possible reconstructions.  Certainly if she were to go this way, nipple sparing mastectomy with implant reconstruction might be her optimal choice.  Bilateral mastectomies does not entirely eliminate the risk of breast cancer but would reduce it to less than 5% over the patient's lifetime.  Another approach to risk reduction is antiestrogens.  Aromatase inhibitors [anastrozole, letrozole and exemestane] are not a choice in pre-menopausal patients. Tamoxifen or raloxefene can be used in pre- or post-menopausal patients.   We discussed these agents in detail including their possible toxicities, side effects and complications.  We also discussed the fact that either of these classes of drugs taken for 5 years would cut her risk of breast cancer in half.  At this point she is considering tamoxifen but is not ready to start it.  A third way to reduce her cancer risk--not specific to breast cancer--is to optimize her diet and exercise routine.  Ideally she would be on a mostly vegetable diet with limited carbohydrates. Meats and dairy are allowed. The exercise goal is 45 minutes 5 times a week of activity vigorous enough to induce perspiration. This would reduce her risk of any cancer developing by between 1 and 3%.    After discussing the options for risk reduction, we discussed intensified screening.  This would include yearly mammography with tomography, biannual breast exams by an MD, and a yearly breast MRI.   [name] understands that adding the MRI may increase cost and also put her at risk for false positives, which may lead to unnecessary procedures.  The point of course would be to find any cancer that may develop at the very earliest stages to increase the chance of cure with minimal interventions (specifically without chemotherapy).  At this point [name] is very clear that she is interested in intensified screening and I have put an order in for breast MRI to be done in October of this year.  She will see me shortly after that.  Since she has her mammography in April possibly she could have her surgery with Dr. Nancy Nordmann on shortly after that and then see him on a yearly basis in April, which means she is getting a breast exam by an MD every 6 months as well.  That would be optimal.  If she decides to try the tamoxifen she will let me know.  [name] has a good understanding of the overall plan. She agrees with it. She knows the goal of treatment in her case is prevention. She will call with any problems that may develop before her next visit here.  Neola Worrall, Virgie Dad, MD  05/24/18 4:55 PM Medical Oncology and Hematology [facility] [address] [place], [place] [address] Tel. [phone]    Fax. [phone]   I, Jacqualyn Posey am acting as a Education administrator for Chauncey Cruel, MD.   I, Lurline Del MD, have reviewed the above documentation for accuracy and completeness, and I agree with the above.

## 2018-05-24 ENCOUNTER — Encounter: Payer: Self-pay | Admitting: Oncology

## 2018-05-24 ENCOUNTER — Inpatient Hospital Stay: Payer: BLUE CROSS/BLUE SHIELD | Attending: Genetic Counselor | Admitting: Oncology

## 2018-05-24 ENCOUNTER — Other Ambulatory Visit: Payer: Self-pay

## 2018-05-24 VITALS — BP 136/83 | HR 88 | Temp 98.5°F | Resp 18 | Ht 62.0 in | Wt 232.0 lb

## 2018-05-24 DIAGNOSIS — Z1501 Genetic susceptibility to malignant neoplasm of breast: Secondary | ICD-10-CM | POA: Diagnosis not present

## 2018-05-24 DIAGNOSIS — Z1239 Encounter for other screening for malignant neoplasm of breast: Secondary | ICD-10-CM

## 2018-05-24 DIAGNOSIS — Z1231 Encounter for screening mammogram for malignant neoplasm of breast: Secondary | ICD-10-CM

## 2018-05-24 DIAGNOSIS — Z803 Family history of malignant neoplasm of breast: Secondary | ICD-10-CM | POA: Insufficient documentation

## 2018-05-24 DIAGNOSIS — Z1509 Genetic susceptibility to other malignant neoplasm: Secondary | ICD-10-CM

## 2018-05-25 ENCOUNTER — Telehealth: Payer: Self-pay | Admitting: Oncology

## 2018-05-25 NOTE — Telephone Encounter (Signed)
Called regarding 10/27

## 2018-08-11 DIAGNOSIS — Z1231 Encounter for screening mammogram for malignant neoplasm of breast: Secondary | ICD-10-CM | POA: Diagnosis not present

## 2018-08-11 DIAGNOSIS — Z803 Family history of malignant neoplasm of breast: Secondary | ICD-10-CM | POA: Diagnosis not present

## 2018-09-25 DIAGNOSIS — Z1501 Genetic susceptibility to malignant neoplasm of breast: Secondary | ICD-10-CM | POA: Diagnosis not present

## 2018-09-25 DIAGNOSIS — Z1509 Genetic susceptibility to other malignant neoplasm: Secondary | ICD-10-CM | POA: Diagnosis not present

## 2018-09-29 DIAGNOSIS — N83201 Unspecified ovarian cyst, right side: Secondary | ICD-10-CM | POA: Diagnosis not present

## 2018-10-16 ENCOUNTER — Other Ambulatory Visit: Payer: Self-pay | Admitting: Obstetrics and Gynecology

## 2018-10-25 ENCOUNTER — Other Ambulatory Visit: Payer: Self-pay

## 2018-10-25 ENCOUNTER — Encounter (HOSPITAL_BASED_OUTPATIENT_CLINIC_OR_DEPARTMENT_OTHER): Payer: Self-pay | Admitting: *Deleted

## 2018-10-25 NOTE — Progress Notes (Signed)
Spoke w/ pt via phone for pre-op interview.  Npo after mn w/ exception clear liquids until 0700 then nothing by mouth, pt verbalized understanding.  Arrive at 1100.  Needs urine preg.  Getting cbc, t&s, and covid test done 10-26-2018.  Will take synthroid am dos w/ sips of water.

## 2018-10-26 ENCOUNTER — Encounter (HOSPITAL_COMMUNITY)
Admission: RE | Admit: 2018-10-26 | Discharge: 2018-10-26 | Disposition: A | Discharge status: Home / Self Care | Payer: BC Managed Care – PPO | Source: Ambulatory Visit | Attending: Obstetrics and Gynecology | Admitting: Obstetrics and Gynecology

## 2018-10-26 ENCOUNTER — Other Ambulatory Visit (HOSPITAL_COMMUNITY)
Admission: RE | Admit: 2018-10-26 | Discharge: 2018-10-26 | Disposition: A | Discharge status: Home / Self Care | Payer: BC Managed Care – PPO | Source: Ambulatory Visit | Attending: Obstetrics and Gynecology | Admitting: Obstetrics and Gynecology

## 2018-10-26 DIAGNOSIS — Z01812 Encounter for preprocedural laboratory examination: Secondary | ICD-10-CM | POA: Diagnosis not present

## 2018-10-26 DIAGNOSIS — Z832 Family history of diseases of the blood and blood-forming organs and certain disorders involving the immune mechanism: Secondary | ICD-10-CM | POA: Diagnosis not present

## 2018-10-26 DIAGNOSIS — Z6841 Body Mass Index (BMI) 40.0 and over, adult: Secondary | ICD-10-CM | POA: Diagnosis not present

## 2018-10-26 DIAGNOSIS — Z79899 Other long term (current) drug therapy: Secondary | ICD-10-CM | POA: Diagnosis not present

## 2018-10-26 DIAGNOSIS — N736 Female pelvic peritoneal adhesions (postinfective): Secondary | ICD-10-CM | POA: Diagnosis not present

## 2018-10-26 DIAGNOSIS — Z1501 Genetic susceptibility to malignant neoplasm of breast: Secondary | ICD-10-CM | POA: Diagnosis not present

## 2018-10-26 DIAGNOSIS — E039 Hypothyroidism, unspecified: Secondary | ICD-10-CM | POA: Diagnosis not present

## 2018-10-26 DIAGNOSIS — Z8349 Family history of other endocrine, nutritional and metabolic diseases: Secondary | ICD-10-CM | POA: Diagnosis not present

## 2018-10-26 DIAGNOSIS — Z833 Family history of diabetes mellitus: Secondary | ICD-10-CM | POA: Diagnosis not present

## 2018-10-26 DIAGNOSIS — Z841 Family history of disorders of kidney and ureter: Secondary | ICD-10-CM | POA: Diagnosis not present

## 2018-10-26 DIAGNOSIS — Z9851 Tubal ligation status: Secondary | ICD-10-CM | POA: Diagnosis not present

## 2018-10-26 DIAGNOSIS — Z8249 Family history of ischemic heart disease and other diseases of the circulatory system: Secondary | ICD-10-CM | POA: Diagnosis not present

## 2018-10-26 DIAGNOSIS — Z803 Family history of malignant neoplasm of breast: Secondary | ICD-10-CM | POA: Diagnosis not present

## 2018-10-26 DIAGNOSIS — Z20828 Contact with and (suspected) exposure to other viral communicable diseases: Secondary | ICD-10-CM | POA: Insufficient documentation

## 2018-10-26 LAB — CBC
HCT: 40.5 % (ref 36.0–46.0)
Hemoglobin: 13 g/dL (ref 12.0–15.0)
MCH: 28.3 pg (ref 26.0–34.0)
MCHC: 32.1 g/dL (ref 30.0–36.0)
MCV: 88.2 fL (ref 80.0–100.0)
Platelets: 222 10*3/uL (ref 150–400)
RBC: 4.59 MIL/uL (ref 3.87–5.11)
RDW: 13.2 % (ref 11.5–15.5)
WBC: 7.5 10*3/uL (ref 4.0–10.5)
nRBC: 0 % (ref 0.0–0.2)

## 2018-10-26 LAB — ABO/RH: ABO/RH(D): O NEG

## 2018-10-26 LAB — SARS CORONAVIRUS 2 (TAT 6-24 HRS): SARS Coronavirus 2: NEGATIVE

## 2018-10-30 ENCOUNTER — Encounter (HOSPITAL_BASED_OUTPATIENT_CLINIC_OR_DEPARTMENT_OTHER)
Admission: RE | Disposition: A | Discharge status: Home / Self Care | Payer: Self-pay | Source: Home / Self Care | Attending: Obstetrics and Gynecology

## 2018-10-30 ENCOUNTER — Ambulatory Visit (HOSPITAL_BASED_OUTPATIENT_CLINIC_OR_DEPARTMENT_OTHER): Payer: BC Managed Care – PPO | Admitting: Anesthesiology

## 2018-10-30 ENCOUNTER — Ambulatory Visit (HOSPITAL_BASED_OUTPATIENT_CLINIC_OR_DEPARTMENT_OTHER)
Admission: RE | Admit: 2018-10-30 | Discharge: 2018-10-30 | Disposition: A | Discharge status: Home / Self Care | Payer: BC Managed Care – PPO | Attending: Obstetrics and Gynecology | Admitting: Obstetrics and Gynecology

## 2018-10-30 ENCOUNTER — Encounter (HOSPITAL_BASED_OUTPATIENT_CLINIC_OR_DEPARTMENT_OTHER): Payer: Self-pay | Admitting: Anesthesiology

## 2018-10-30 DIAGNOSIS — Z8249 Family history of ischemic heart disease and other diseases of the circulatory system: Secondary | ICD-10-CM | POA: Diagnosis not present

## 2018-10-30 DIAGNOSIS — Z1501 Genetic susceptibility to malignant neoplasm of breast: Secondary | ICD-10-CM | POA: Diagnosis not present

## 2018-10-30 DIAGNOSIS — Z803 Family history of malignant neoplasm of breast: Secondary | ICD-10-CM | POA: Insufficient documentation

## 2018-10-30 DIAGNOSIS — Z8349 Family history of other endocrine, nutritional and metabolic diseases: Secondary | ICD-10-CM | POA: Diagnosis not present

## 2018-10-30 DIAGNOSIS — Z6841 Body Mass Index (BMI) 40.0 and over, adult: Secondary | ICD-10-CM | POA: Insufficient documentation

## 2018-10-30 DIAGNOSIS — N838 Other noninflammatory disorders of ovary, fallopian tube and broad ligament: Secondary | ICD-10-CM | POA: Diagnosis not present

## 2018-10-30 DIAGNOSIS — Z79899 Other long term (current) drug therapy: Secondary | ICD-10-CM | POA: Insufficient documentation

## 2018-10-30 DIAGNOSIS — N736 Female pelvic peritoneal adhesions (postinfective): Secondary | ICD-10-CM | POA: Diagnosis not present

## 2018-10-30 DIAGNOSIS — Z4002 Encounter for prophylactic removal of ovary: Secondary | ICD-10-CM | POA: Diagnosis not present

## 2018-10-30 DIAGNOSIS — Z841 Family history of disorders of kidney and ureter: Secondary | ICD-10-CM | POA: Diagnosis not present

## 2018-10-30 DIAGNOSIS — Z9851 Tubal ligation status: Secondary | ICD-10-CM | POA: Insufficient documentation

## 2018-10-30 DIAGNOSIS — Z832 Family history of diseases of the blood and blood-forming organs and certain disorders involving the immune mechanism: Secondary | ICD-10-CM | POA: Diagnosis not present

## 2018-10-30 DIAGNOSIS — Z833 Family history of diabetes mellitus: Secondary | ICD-10-CM | POA: Diagnosis not present

## 2018-10-30 DIAGNOSIS — E039 Hypothyroidism, unspecified: Secondary | ICD-10-CM | POA: Insufficient documentation

## 2018-10-30 HISTORY — DX: Personal history of gestational diabetes: Z86.32

## 2018-10-30 HISTORY — PX: ROBOTIC ASSISTED SALPINGO OOPHERECTOMY: SHX6082

## 2018-10-30 LAB — POCT PREGNANCY, URINE: Preg Test, Ur: NEGATIVE

## 2018-10-30 LAB — TYPE AND SCREEN
ABO/RH(D): O NEG
Antibody Screen: NEGATIVE

## 2018-10-30 SURGERY — SALPINGO-OOPHORECTOMY, ROBOT-ASSISTED
Anesthesia: General | Laterality: Right

## 2018-10-30 MED ORDER — FENTANYL CITRATE (PF) 100 MCG/2ML IJ SOLN
INTRAMUSCULAR | Status: DC | PRN
  Administered 2018-10-30 (×4): 50 ug via INTRAVENOUS

## 2018-10-30 MED ORDER — METOCLOPRAMIDE HCL 5 MG/ML IJ SOLN
10.0000 mg | Freq: Once | INTRAMUSCULAR | Status: DC | PRN
  Filled 2018-10-30: qty 2

## 2018-10-30 MED ORDER — FENTANYL CITRATE (PF) 100 MCG/2ML IJ SOLN
INTRAMUSCULAR | Status: AC
  Filled 2018-10-30: qty 2

## 2018-10-30 MED ORDER — BUPIVACAINE HCL (PF) 0.25 % IJ SOLN
INTRAMUSCULAR | Status: DC | PRN
  Administered 2018-10-30: 30 mL

## 2018-10-30 MED ORDER — LACTATED RINGERS IV SOLN
INTRAVENOUS | Status: DC
  Administered 2018-10-30: 16:00:00 via INTRAVENOUS
  Filled 2018-10-30: qty 1000

## 2018-10-30 MED ORDER — DEXAMETHASONE SODIUM PHOSPHATE 10 MG/ML IJ SOLN
INTRAMUSCULAR | Status: AC
  Filled 2018-10-30: qty 1

## 2018-10-30 MED ORDER — CEFAZOLIN SODIUM-DEXTROSE 2-4 GM/100ML-% IV SOLN
INTRAVENOUS | Status: AC
  Filled 2018-10-30: qty 100

## 2018-10-30 MED ORDER — LIDOCAINE 2% (20 MG/ML) 5 ML SYRINGE
INTRAMUSCULAR | Status: AC
  Filled 2018-10-30: qty 5

## 2018-10-30 MED ORDER — SODIUM CHLORIDE 0.9 % IV SOLN
INTRAVENOUS | Status: DC | PRN
  Administered 2018-10-30: 60 mL

## 2018-10-30 MED ORDER — OXYCODONE-ACETAMINOPHEN 5-325 MG PO TABS: 1.0000 | ORAL_TABLET | ORAL | 0 refills | Status: DC | PRN

## 2018-10-30 MED ORDER — OXYCODONE HCL 5 MG PO TABS
5.0000 mg | ORAL_TABLET | Freq: Once | ORAL | Status: AC
  Administered 2018-10-30: 5 mg via ORAL
  Filled 2018-10-30: qty 1

## 2018-10-30 MED ORDER — SCOPOLAMINE 1 MG/3DAYS TD PT72
MEDICATED_PATCH | TRANSDERMAL | Status: DC | PRN
  Administered 2018-10-30: 1 via TRANSDERMAL

## 2018-10-30 MED ORDER — MIDAZOLAM HCL 2 MG/2ML IJ SOLN
INTRAMUSCULAR | Status: DC | PRN
  Administered 2018-10-30: 2 mg via INTRAVENOUS

## 2018-10-30 MED ORDER — OXYCODONE HCL 5 MG PO TABS
ORAL_TABLET | ORAL | Status: AC
  Filled 2018-10-30: qty 1

## 2018-10-30 MED ORDER — ONDANSETRON HCL 4 MG/2ML IJ SOLN
INTRAMUSCULAR | Status: AC
  Filled 2018-10-30: qty 2

## 2018-10-30 MED ORDER — LACTATED RINGERS IV SOLN
INTRAVENOUS | Status: DC
  Administered 2018-10-30: 12:00:00 via INTRAVENOUS
  Filled 2018-10-30: qty 1000

## 2018-10-30 MED ORDER — GLYCOPYRROLATE PF 0.2 MG/ML IJ SOSY
PREFILLED_SYRINGE | INTRAMUSCULAR | Status: DC | PRN
  Administered 2018-10-30 (×2): .2 mg via INTRAVENOUS

## 2018-10-30 MED ORDER — PROPOFOL 10 MG/ML IV BOLUS
INTRAVENOUS | Status: DC | PRN
  Administered 2018-10-30: 200 mg via INTRAVENOUS

## 2018-10-30 MED ORDER — ROCURONIUM BROMIDE 10 MG/ML (PF) SYRINGE
PREFILLED_SYRINGE | INTRAVENOUS | Status: DC | PRN
  Administered 2018-10-30: 20 mg via INTRAVENOUS
  Administered 2018-10-30: 50 mg via INTRAVENOUS

## 2018-10-30 MED ORDER — ACETAMINOPHEN 10 MG/ML IV SOLN
INTRAVENOUS | Status: DC | PRN
  Administered 2018-10-30: 1000 mg via INTRAVENOUS

## 2018-10-30 MED ORDER — DEXAMETHASONE SODIUM PHOSPHATE 10 MG/ML IJ SOLN
INTRAMUSCULAR | Status: DC | PRN
  Administered 2018-10-30: 8 mg via INTRAVENOUS

## 2018-10-30 MED ORDER — LIDOCAINE 2% (20 MG/ML) 5 ML SYRINGE
INTRAMUSCULAR | Status: DC | PRN
  Administered 2018-10-30: 100 mg via INTRAVENOUS

## 2018-10-30 MED ORDER — LIDOCAINE 2% (20 MG/ML) 5 ML SYRINGE
INTRAMUSCULAR | Status: DC | PRN
  Administered 2018-10-30: 1.5 mg/kg/h via INTRAVENOUS

## 2018-10-30 MED ORDER — FENTANYL CITRATE (PF) 100 MCG/2ML IJ SOLN
25.0000 ug | INTRAMUSCULAR | Status: DC | PRN
  Filled 2018-10-30: qty 1

## 2018-10-30 MED ORDER — CEFAZOLIN SODIUM-DEXTROSE 2-4 GM/100ML-% IV SOLN
2.0000 g | INTRAVENOUS | Status: AC
  Administered 2018-10-30: 2 g via INTRAVENOUS
  Filled 2018-10-30: qty 100

## 2018-10-30 MED ORDER — MIDAZOLAM HCL 2 MG/2ML IJ SOLN
INTRAMUSCULAR | Status: AC
  Filled 2018-10-30: qty 2

## 2018-10-30 MED ORDER — KETOROLAC TROMETHAMINE 30 MG/ML IJ SOLN
INTRAMUSCULAR | Status: DC | PRN
  Administered 2018-10-30: 30 mg via INTRAVENOUS

## 2018-10-30 MED ORDER — SCOPOLAMINE 1 MG/3DAYS TD PT72
MEDICATED_PATCH | TRANSDERMAL | Status: AC
  Filled 2018-10-30: qty 1

## 2018-10-30 MED ORDER — ONDANSETRON HCL 4 MG/2ML IJ SOLN
INTRAMUSCULAR | Status: DC | PRN
  Administered 2018-10-30 (×2): 4 mg via INTRAVENOUS

## 2018-10-30 MED ORDER — ROCURONIUM BROMIDE 10 MG/ML (PF) SYRINGE
PREFILLED_SYRINGE | INTRAVENOUS | Status: AC
  Filled 2018-10-30: qty 10

## 2018-10-30 MED ORDER — ACETAMINOPHEN 10 MG/ML IV SOLN
INTRAVENOUS | Status: AC
  Filled 2018-10-30: qty 100

## 2018-10-30 MED ORDER — GABAPENTIN 300 MG PO CAPS
ORAL_CAPSULE | ORAL | Status: AC
  Filled 2018-10-30: qty 1

## 2018-10-30 MED ORDER — SUGAMMADEX SODIUM 200 MG/2ML IV SOLN
INTRAVENOUS | Status: DC | PRN
  Administered 2018-10-30: 200 mg via INTRAVENOUS

## 2018-10-30 MED ORDER — ONDANSETRON HCL 4 MG/2ML IJ SOLN
INTRAMUSCULAR | Status: AC
Start: 2018-10-30 — End: ?
  Filled 2018-10-30: qty 2

## 2018-10-30 MED ORDER — ARTIFICIAL TEARS OPHTHALMIC OINT
TOPICAL_OINTMENT | OPHTHALMIC | Status: AC
Start: 2018-10-30 — End: ?
  Filled 2018-10-30: qty 3.5

## 2018-10-30 MED ORDER — KETOROLAC TROMETHAMINE 30 MG/ML IJ SOLN
INTRAMUSCULAR | Status: AC
  Filled 2018-10-30: qty 1

## 2018-10-30 MED ORDER — MEPERIDINE HCL 25 MG/ML IJ SOLN
6.2500 mg | INTRAMUSCULAR | Status: DC | PRN
  Filled 2018-10-30: qty 1

## 2018-10-30 MED ORDER — PROPOFOL 10 MG/ML IV BOLUS
INTRAVENOUS | Status: AC
  Filled 2018-10-30: qty 20

## 2018-10-30 SURGICAL SUPPLY — 61 items
ADH SKN CLS APL DERMABOND .7 (GAUZE/BANDAGES/DRESSINGS) ×1
BAG SPEC RTRVL LRG 6X4 10 (ENDOMECHANICALS) ×1
BARRIER ADHS 3X4 INTERCEED (GAUZE/BANDAGES/DRESSINGS) IMPLANT
BRR ADH 4X3 ABS CNTRL BYND (GAUZE/BANDAGES/DRESSINGS)
CANISTER SUCT 3000ML PPV (MISCELLANEOUS) ×2 IMPLANT
CATH FOLEY 3WAY  5CC 16FR (CATHETERS) ×1
CATH FOLEY 3WAY 5CC 16FR (CATHETERS) ×1 IMPLANT
COVER BACK TABLE 60X90IN (DRAPES) ×2 IMPLANT
COVER TIP SHEARS 8 DVNC (MISCELLANEOUS) ×1 IMPLANT
COVER TIP SHEARS 8MM DA VINCI (MISCELLANEOUS) ×1
COVER WAND RF STERILE (DRAPES) ×2 IMPLANT
DECANTER SPIKE VIAL GLASS SM (MISCELLANEOUS) ×4 IMPLANT
DEFOGGER SCOPE WARMER CLEARIFY (MISCELLANEOUS) ×2 IMPLANT
DERMABOND ADVANCED (GAUZE/BANDAGES/DRESSINGS) ×1
DERMABOND ADVANCED .7 DNX12 (GAUZE/BANDAGES/DRESSINGS) ×1 IMPLANT
DRAPE ARM DVNC X/XI (DISPOSABLE) ×4 IMPLANT
DRAPE COLUMN DVNC XI (DISPOSABLE) ×1 IMPLANT
DRAPE DA VINCI XI ARM (DISPOSABLE) ×4
DRAPE DA VINCI XI COLUMN (DISPOSABLE) ×1
DURAPREP 26ML APPLICATOR (WOUND CARE) ×2 IMPLANT
ELECT REM PT RETURN 9FT ADLT (ELECTROSURGICAL) ×2
ELECTRODE REM PT RTRN 9FT ADLT (ELECTROSURGICAL) ×1 IMPLANT
GAUZE 4X4 16PLY RFD (DISPOSABLE) ×2 IMPLANT
GLOVE BIO SURGEON STRL SZ7 (GLOVE) ×3 IMPLANT
GLOVE BIO SURGEON STRL SZ7.5 (GLOVE) ×6 IMPLANT
GLOVE BIOGEL PI IND STRL 7.0 (GLOVE) IMPLANT
GLOVE BIOGEL PI INDICATOR 7.0 (GLOVE) ×3
IRRIG SUCT STRYKERFLOW 2 WTIP (MISCELLANEOUS) ×2
IRRIGATION SUCT STRKRFLW 2 WTP (MISCELLANEOUS) ×1 IMPLANT
NEEDLE INSUFFLATION 150MM (ENDOMECHANICALS) ×2 IMPLANT
OBTURATOR OPTICAL STANDARD 8MM (TROCAR) ×1
OBTURATOR OPTICAL STND 8 DVNC (TROCAR) ×1
OBTURATOR OPTICALSTD 8 DVNC (TROCAR) ×1 IMPLANT
OCCLUDER COLPOPNEUMO (BALLOONS) ×2 IMPLANT
PACK ROBOT WH (CUSTOM PROCEDURE TRAY) ×2 IMPLANT
PACK ROBOTIC GOWN (GOWN DISPOSABLE) ×2 IMPLANT
PACK TRENDGUARD 450 HYBRID PRO (MISCELLANEOUS) IMPLANT
PAD PREP 24X48 CUFFED NSTRL (MISCELLANEOUS) ×2 IMPLANT
POUCH SPECIMEN RETRIEVAL 10MM (ENDOMECHANICALS) ×1 IMPLANT
PROTECTOR NERVE ULNAR (MISCELLANEOUS) ×4 IMPLANT
SEAL CANN UNIV 5-8 DVNC XI (MISCELLANEOUS) ×4 IMPLANT
SEAL XI 5MM-8MM UNIVERSAL (MISCELLANEOUS) ×4
SET CYSTO W/LG BORE CLAMP LF (SET/KITS/TRAYS/PACK) IMPLANT
SET TRI-LUMEN FLTR TB AIRSEAL (TUBING) ×2 IMPLANT
SPONGE LAP 4X18 RFD (DISPOSABLE) ×1 IMPLANT
SUT VIC AB 0 CT1 27 (SUTURE) ×2
SUT VIC AB 0 CT1 27XBRD ANBCTR (SUTURE) ×1 IMPLANT
SUT VICRYL 0 UR6 27IN ABS (SUTURE) ×3 IMPLANT
SUT VICRYL RAPIDE 4/0 PS 2 (SUTURE) ×4 IMPLANT
SUT VLOC 180 0 9IN  GS21 (SUTURE) ×1
SUT VLOC 180 0 9IN GS21 (SUTURE) ×1 IMPLANT
TIP RUMI ORANGE 6.7MMX12CM (TIP) IMPLANT
TIP UTERINE 5.1X6CM LAV DISP (MISCELLANEOUS) IMPLANT
TIP UTERINE 6.7X10CM GRN DISP (MISCELLANEOUS) IMPLANT
TIP UTERINE 6.7X6CM WHT DISP (MISCELLANEOUS) IMPLANT
TIP UTERINE 6.7X8CM BLUE DISP (MISCELLANEOUS) ×1 IMPLANT
TOWEL OR 17X26 10 PK STRL BLUE (TOWEL DISPOSABLE) ×2 IMPLANT
TRENDGUARD 450 HYBRID PRO PACK (MISCELLANEOUS) ×2
TROCAR PORT AIRSEAL 8X120 (TROCAR) ×2 IMPLANT
TROCAR XCEL NON-BLD 11X100MML (ENDOMECHANICALS) ×1 IMPLANT
WATER STERILE IRR 1000ML POUR (IV SOLUTION) ×2 IMPLANT

## 2018-10-30 NOTE — H&P (Signed)
NAME: [name], [name] MEDICAL RECORD [id number] ACCOUNT [id number] DATE OF BIRTH:Jul 18, 1975 FACILITY: [facility] LOCATION: [facility] PHYSICIAN:Nemiah Bubar J. Ronita Hipps, MD  HISTORY AND PHYSICAL  DATE OF ADMISSION:  10/30/2018  CHIEF COMPLAINT:  BRCA1 positive for risk-reducing bilateral salpingo-oophorectomy.  HISTORY OF PRESENT ILLNESS:  This is a 43 year old white female, G3 P3 status post C-section and lysis of adhesions and tubal ligation in 2016 who presents now for aforementioned indications for surgery.  ALLERGIES:  No known drug allergies.  MEDICATIONS:  Thyroid replacement therapy and Xanax as needed.  SOCIAL HISTORY:  She is a nonsmoker, nondrinker.  She denies domestic physical violence.  FAMILY HISTORY:  She has a family history of diabetes, thyroid disease, kidney disease, breast cancer in first-degree relatives, hypertension, heart disease, PCOS and unknown blood disorder.    PREGNANCY HISTORY:  Remarkable for 3 C-section.  PAST SURGICAL HISTORY:  Remarkable for hysteroscopy and wisdom tooth extraction.  PHYSICAL EXAMINATION: GENERAL:  A well-developed, well-nourished, obese white female in no acute distress. HEENT:  Normal. NECK:  Supple, full range of motion. LUNGS:  Clear. HEART:  Regular rhythm. ABDOMEN:  Soft, nontender. PELVIC:  Reveals a normal sized uterus, no adnexal masses are palpable. EXTREMITIES:  No cords. NEUROLOGIC:  Nonfocal. SKIN:  Intact.  IMPRESSION: 1.  BRCA1, for risk-reducing bilateral salpingo-oophorectomy. 2.  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 3.  Status post tubal ligation. 4.  History of pelvic adhesive disease, as noted cesarean section.  PLAN:  Proceed with da Vinci-assisted right salpingo-oophorectomy.  Risks of anesthesia, infection, bleeding, injury to surrounding organs, possible need for repair discussed.  Delayed versus immediate complications to include bowel, bladder injury  noted.  The patient acknowledges and  wished to proceed.  JN/NUANCE  D:10/29/2018 T:10/30/2018 JOB:[id number]

## 2018-10-30 NOTE — Op Note (Signed)
10/30/2018  3:22 PM  PATIENT:  [name]  43 y.o. female  PRE-OPERATIVE DIAGNOSIS:  BRCA 1 positive  POST-OPERATIVE DIAGNOSIS:  BRCA 1 positive  PROCEDURE:  Procedure(s): XI ROBOTIC ASSISTED RIGHT SALPINGO OOPHORECTOMY LEFT SALPINGECTOMY LYSIS OF RIGHT AND LEFT ADNEXAL ADHESIONS PERITONEAL LAVAGE  SURGEON:  Surgeon(s): Brien Few, MD Servando Salina, MD  ASSISTANTS: COUSINS, MD   ANESTHESIA:   local and general  ESTIMATED BLOOD LOSS: 20 mL   DRAINS: Urinary Catheter (Foley)   LOCAL MEDICATIONS USED:  MARCAINE    and Amount: 20 ml  SPECIMEN:  Source of Specimen:  RIGHT OVARY, BILATERAL TUBES  DISPOSITION OF SPECIMEN:  PATHOLOGY  COUNTS:  YES  DICTATION #: [id number]  PLAN OF CARE: DC HOME  PATIENT DISPOSITION:  PACU - hemodynamically stable.

## 2018-10-30 NOTE — Progress Notes (Signed)
Patient seen and examined. Consent witnessed and signed. No changes noted. Update completed. BP (!) 145/80   Pulse 72   Temp 98.5 F (36.9 C) (Oral)   Resp 16   Ht 5\' 1"  (1.549 m)   Wt 103.5 kg   LMP 10/02/2018   SpO2 99%   BMI 43.10 kg/m   CBC    Component Value Date/Time   WBC 7.5 10/26/2018 1017   RBC 4.59 10/26/2018 1017   HGB 13.0 10/26/2018 1017   HCT 40.5 10/26/2018 1017   PLT 222 10/26/2018 1017   MCV 88.2 10/26/2018 1017   MCH 28.3 10/26/2018 1017   MCHC 32.1 10/26/2018 1017   RDW 13.2 10/26/2018 1017

## 2018-10-30 NOTE — Anesthesia Postprocedure Evaluation (Signed)
Anesthesia Post Note  Patient: [name]  Procedure(s) Performed: XI ROBOTIC ASSISTED SALPINGO OOPHORECTOMY/With Lysis of Adhesions (Right )     Patient location during evaluation: PACU Anesthesia Type: General Level of consciousness: awake Pain management: pain level controlled Vital Signs Assessment: post-procedure vital signs reviewed and stable Respiratory status: spontaneous breathing Cardiovascular status: stable Postop Assessment: no apparent nausea or vomiting Anesthetic complications: no    Last Vitals:  Vitals:   10/30/18 1625 10/30/18 1715  BP: 117/62 126/76  Pulse: 77 77  Resp: 16 18  Temp:  36.9 C  SpO2: 99% 99%    Last Pain:  Vitals:   10/30/18 1715  TempSrc:   PainSc: 3    Pain Goal: Patients Stated Pain Goal: 4 (10/30/18 1100)                 Huston Foley

## 2018-10-30 NOTE — Anesthesia Preprocedure Evaluation (Signed)
Anesthesia Evaluation  Patient identified by MRN, date of birth, ID band Patient awake    Reviewed: Allergy & Precautions, NPO status , Patient's Chart, lab work & pertinent test results  Airway Mallampati: II  TM Distance: >3 FB Neck ROM: Full    Dental no notable dental hx.    Pulmonary neg pulmonary ROS,    Pulmonary exam normal breath sounds clear to auscultation       Cardiovascular negative cardio ROS Normal cardiovascular exam Rhythm:Regular Rate:Normal     Neuro/Psych negative neurological ROS  negative psych ROS   GI/Hepatic negative GI ROS, Neg liver ROS,   Endo/Other  Hypothyroidism Morbid obesity  Renal/GU negative Renal ROS  negative genitourinary   Musculoskeletal negative musculoskeletal ROS (+)   Abdominal   Peds negative pediatric ROS (+)  Hematology negative hematology ROS (+)   Anesthesia Other Findings   Reproductive/Obstetrics negative OB ROS                             Anesthesia Physical Anesthesia Plan  ASA: III  Anesthesia Plan: General   Post-op Pain Management:    Induction: Intravenous  PONV Risk Score and Plan: 4 or greater and Ondansetron, Dexamethasone, Midazolam, Scopolamine patch - Pre-op and Treatment may vary due to age or medical condition  Airway Management Planned: Oral ETT  Additional Equipment:   Intra-op Plan:   Post-operative Plan: Extubation in OR  Informed Consent: I have reviewed the patients History and Physical, chart, labs and discussed the procedure including the risks, benefits and alternatives for the proposed anesthesia with the patient or authorized representative who has indicated his/her understanding and acceptance.     Dental advisory given  Plan Discussed with: CRNA  Anesthesia Plan Comments:         Anesthesia Quick Evaluation

## 2018-10-30 NOTE — Op Note (Signed)
NAME: BETHANNE, MULE MEDICAL RECORD [id number] ACCOUNT [id number] DATE OF BIRTH:Apr 16, 1975 FACILITY: [facility] LOCATION: [facility] PHYSICIAN:Kamran Coker J. Ronita Hipps, MD  OPERATIVE REPORT  DATE OF PROCEDURE:  10/30/2018  PREOPERATIVE DIAGNOSIS:  BRCA1 positive.  POSTOPERATIVE DIAGNOSIS:  BRCA1 positive, right and left adnexal adhesions.  PROCEDURE:  Da Vinci-assisted robotic right salpingo-oophorectomy, left salpingectomy, lysis of right and left adnexal adhesions, peritoneal lavage.  SURGEON:  Brien Few, MD  ASSISTANT:  Servando Salina, MD  ANESTHESIA:  General, local.  ESTIMATED BLOOD LOSS:  20 mL.  COMPLICATIONS:  None.  DRAINS:  None.  COUNTS:  Correct.  DISPOSITION:  The patient was taken to recovery in good condition.  BRIEF OPERATIVE NOTE:  After being apprised of the risks of anesthesia, infection, bleeding, injury to surrounding organs, possible need for repair, delayed versus immediate complications including bowel and bladder injury, possible need for repair, the  patient was brought to the operating room and administered general anesthetic without complications.  Prepped in the usual sterile fashion, a Foley catheter placed.  RUMI retractor placed vaginally.  An infraumbilical incision was made with a scalpel.   Veress needle placed, opening pressure of 2.  Five liters of CO2 insufflated without difficulty.  Trocar placed atraumatically.  Visualization revealed bilateral adnexal adhesions.  Normal-appearing polycystic right ovary.  Surgically divided right tube  and a small cornual segment of questionable left tube.  At this time, 2 robotic ports on the left, 1 on the right.  All trocars placed atraumatically.  AirSeal was established.  Long forceps and EndoShears were placed into the retroperitoneal space ____  the right ureter identified.  Infundibulopelvic ligament was clamped and cut approximately 2.5 cm distal to the ovary.  Good hemostasis was noted.   Progressive dissection along the mesosalpinx was done down to the level of the tubo-ovarian uterine  junction, and the specimen was then removed using bipolar cautery and placed in the anterior cul-de-sac.  The left segment of fallopian tube was then excised using sharp dissection and mono and bipolar cautery as well at the level of the cornua.  At this  time, having noted prior to the dissection that adhesions of the left sigmoid colon to the left adnexa and right adnexa to the right pelvic sidewall were previously lysed.  At this time, irrigation was accomplished.  Please note prior to the dissection,  peritoneal lavage was used to collect approximately 300 to 400 mL of clear fluid after the appropriate lavage technique was done.  Subsequently, the procedure was terminated.  All instruments were removed under direct visualization, and the ovary and  tubal segments were placed in the EndoCatch sac and removed through an umbilical port with minimal difficulty in a piecemeal fashion.  CO2 was released.  The abdominal incision was closed using 0 Vicryl, 4-0 Vicryl.  The other incisions were closed using  4-0 Vicryl.  Local anesthetic and Dermabond were placed.  The patient tolerated the procedure well, was awakened, and transferred to recovery in good condition.  LN/NUANCE  D:10/30/2018 T:10/30/2018 JOB:[id number]

## 2018-10-30 NOTE — Discharge Instructions (Signed)
DISCHARGE INSTRUCTIONS: Laparoscopy  The following instructions have been prepared to help you care for yourself upon your return home today.  Wound care:  Do not get the incision wet for the first 24 hours. The incision should be kept clean and dry.  The Band-Aids or dressings may be removed the day after surgery.  Should the incision become sore, red, and swollen after the first week, check with your doctor.  Personal hygiene:  Shower the day after your procedure.  Activity and limitations:  Do NOT drive or operate any equipment today.  Do NOT lift anything more than 15 pounds for 2-3 weeks after surgery.  Do NOT rest in bed all day.  Walking is encouraged. Walk each day, starting slowly with 5-minute walks 3 or 4 times a day. Slowly increase the length of your walks.  Walk up and down stairs slowly.  Do NOT do strenuous activities, such as golfing, playing tennis, bowling, running, biking, weight lifting, gardening, mowing, or vacuuming for 2-4 weeks. Ask your doctor when it is okay to start.  Diet: Eat a light meal as desired this evening. You may resume your usual diet tomorrow.  Return to work: This is dependent on the type of work you do. For the most part you can return to a desk job within a week of surgery. If you are more active at work, please discuss this with your doctor.  What to expect after your surgery: You may have a slight burning sensation when you urinate on the first day. You may have a very small amount of blood in the urine. Expect to have a small amount of vaginal discharge/light bleeding for 1-2 weeks. It is not unusual to have abdominal soreness and bruising for up to 2 weeks. You may be tired and need more rest for about 1 week. You may experience shoulder pain for 24-72 hours. Lying flat in bed may relieve it.  Call your doctor for any of the following:  Develop a fever of 100.4 or greater  Inability to urinate 6 hours after discharge from  hospital  Severe pain not relieved by pain medications  Persistent of heavy bleeding at incision site  Redness or swelling around incision site after a week  Increasing nausea or vomiting  Patient Signature________________________________________ Nurse Signature_________________________________________   NO IBUPROFEN PRODUCTS UNTIL 8:35PM TODAY.    Post Anesthesia Home Care Instructions  Activity: Get plenty of rest for the remainder of the day. A responsible individual must stay with you for 24 hours following the procedure.  For the next 24 hours, DO NOT: -Drive a car -Advertising copywriterperate machinery -Drink alcoholic beverages -Take any medication unless instructed by your physician -Make any legal decisions or sign important papers.  Meals: Start with liquid foods such as gelatin or soup. Progress to regular foods as tolerated. Avoid greasy, spicy, heavy foods. If nausea and/or vomiting occur, drink only clear liquids until the nausea and/or vomiting subsides. Call your physician if vomiting continues.  Special Instructions/Symptoms: Your throat may feel dry or sore from the anesthesia or the breathing tube placed in your throat during surgery. If this causes discomfort, gargle with warm salt water. The discomfort should disappear within 24 hours.  If you had a scopolamine patch placed behind your ear for the management of post- operative nausea and/or vomiting:  1. The medication in the patch is effective for 72 hours, after which it should be removed.  Wrap patch in a tissue and discard in the trash. Wash hands  thoroughly with soap and water. 2. You may remove the patch earlier than 72 hours if you experience unpleasant side effects which may include dry mouth, dizziness or visual disturbances. 3. Avoid touching the patch. Wash your hands with soap and water after contact with the patch.

## 2018-10-30 NOTE — Transfer of Care (Signed)
Last Vitals:  Vitals Value Taken Time  BP 114/69 10/30/18 1504  Temp 36.6 C 10/30/18 1504  Pulse 93 10/30/18 1514  Resp 23 10/30/18 1514  SpO2 94 % 10/30/18 1514  Vitals shown include unvalidated device data.  Last Pain:  Vitals:   10/30/18 1504  TempSrc:   PainSc: 0-No pain      Patients Stated Pain Goal: 4 (10/30/18 1100)  Immediate Anesthesia Transfer of Care Note  Patient: [name]  Procedure(s) Performed: Procedure(s) (LRB): XI ROBOTIC ASSISTED SALPINGO OOPHORECTOMY/With Lysis of Adhesions (Right)  Patient Location: PACU  Anesthesia Type: General  Level of Consciousness: awake, alert  and oriented  Airway & Oxygen Therapy: Patient Spontanous Breathing and Patient connected to nasal cannula oxygen  Post-op Assessment: Report given to PACU RN and Post -op Vital signs reviewed and stable  Post vital signs: Reviewed and stable  Complications: No apparent anesthesia complications

## 2018-10-30 NOTE — Anesthesia Procedure Notes (Signed)
Procedure Name: Intubation Date/Time: 10/30/2018 1:09 PM Performed by: Mechele Claude, CRNA Pre-anesthesia Checklist: Patient identified, Emergency Drugs available, Suction available and Patient being monitored Patient Re-evaluated:Patient Re-evaluated prior to induction Oxygen Delivery Method: Circle system utilized Preoxygenation: Pre-oxygenation with 100% oxygen Induction Type: IV induction Ventilation: Mask ventilation without difficulty Laryngoscope Size: Mac and 3 Grade View: Grade I Tube type: Oral Tube size: 7.0 mm Number of attempts: 1 Airway Equipment and Method: Stylet and Oral airway Placement Confirmation: ETT inserted through vocal cords under direct vision,  positive ETCO2 and breath sounds checked- equal and bilateral Secured at: 21 cm Tube secured with: Tape Dental Injury: Teeth and Oropharynx as per pre-operative assessment

## 2018-11-01 ENCOUNTER — Encounter (HOSPITAL_BASED_OUTPATIENT_CLINIC_OR_DEPARTMENT_OTHER): Payer: Self-pay | Admitting: Obstetrics and Gynecology

## 2018-11-07 ENCOUNTER — Other Ambulatory Visit: Payer: Self-pay | Admitting: Oncology

## 2018-11-13 DIAGNOSIS — R232 Flushing: Secondary | ICD-10-CM | POA: Diagnosis not present

## 2018-12-14 ENCOUNTER — Telehealth: Payer: Self-pay | Admitting: *Deleted

## 2018-12-14 DIAGNOSIS — Z1239 Encounter for other screening for malignant neoplasm of breast: Secondary | ICD-10-CM

## 2018-12-14 DIAGNOSIS — Z1501 Genetic susceptibility to malignant neoplasm of breast: Secondary | ICD-10-CM

## 2018-12-14 MED ORDER — GABAPENTIN 300 MG PO CAPS: 300.0000 mg | ORAL_CAPSULE | Freq: Every day | ORAL | 3 refills | Status: DC

## 2018-12-14 NOTE — Telephone Encounter (Signed)
This RN spoke with per her call wanting to inquire about " who would Dr Jana Hakim suggest for consult regarding prophylactic mastectomy and reconstruction ?"  Above discussed - including Dr Bernita Buffy at [facility] and Dr Iran Planas at [facility]- discussed benefits of both MD's with plan for referral to Dr Iran Planas.  Referral will be placed per above.  [name] then discussed onset of severe postmenopausal symptoms interfering with her ADL's including mood changes,hot flashes and difficulty sleeping.  She states she has " been told that Paxil might help ?"  This RN discussed above medication as well as other medication interventions- with pt wanting to try gabapentin due to main issue is " not sleeping because of the hot flashes at night ".  Medication discussed including benefits and SE. Prescription  sent to verified pharmacy.

## 2019-01-01 ENCOUNTER — Telehealth: Payer: Self-pay

## 2019-01-01 NOTE — Telephone Encounter (Signed)
RN successfully faxed over referral information to Dr. Para Skeans office at [phone]. Pt notified.

## 2019-01-04 DIAGNOSIS — Z6841 Body Mass Index (BMI) 40.0 and over, adult: Secondary | ICD-10-CM | POA: Diagnosis not present

## 2019-01-04 DIAGNOSIS — Z803 Family history of malignant neoplasm of breast: Secondary | ICD-10-CM | POA: Diagnosis not present

## 2019-01-04 DIAGNOSIS — Z1501 Genetic susceptibility to malignant neoplasm of breast: Secondary | ICD-10-CM | POA: Diagnosis not present

## 2019-01-08 NOTE — Progress Notes (Signed)
[name]  Telephone:([phone]) [phone] [phone]    ID: [name] DOB: [birth year]  MR#: [id number]  CSN#:[id number]  Patient Care Team: Brien Few, MD as PCP - General (Obstetrics and Gynecology) Magrinat, Virgie Dad, MD as Consulting Physician (Oncology) Irene Limbo, MD as Consulting Physician (Plastic Surgery) OTHER MD:   CHIEF COMPLAINT: BRCA1 positive  CURRENT TREATMENT: Intensified screening   INTERVAL HISTORY: [name] returns today for follow up of her high risk for breast cancer.  Since her last visit, she underwent right salpingo-oophorectomy and left salpingectomy on 10/30/2018 under Dr. Ronita Hipps. Pathology [phone]) and cytology ([id number]) from the procedure were benign.  She is scheduled for bilateral breast MRI on 01/19/2019.   REVIEW OF SYSTEMS: Willia is experiencing the expected menopausal symptoms of moodiness, hot flashes, some vaginal dryness.  Weight is stable.  She is actively considering bilateral mastectomies.  She exercises regularly.  She is taking appropriate pandemic precautions.  A detailed review of systems today was otherwise stable.   HISTORY OF CURRENT ILLNESS: From the original intake note:  [name] was recently diagnosed as BRCA 1 positive at c.213-11T>G on 04/05/2018 through the [facility] Multi-Cancer Panel.  She is here today to discuss management options.  For a full account of her family history and a pedigree please refer to the genetics counseling note dated 05/04/2018  The patient's subsequent history is as detailed below.   PAST MEDICAL HISTORY: Past Medical History:  Diagnosis Date  . BRCA1 gene mutation positive    dx 01/ 2020  . Family history of breast cancer   . Hemorrhoids   . History of gestational diabetes   . 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 CESAREAN SECTION  2007 & 2010   x 2 - at [facility]  . CESAREAN SECTION WITH BILATERAL TUBAL LIGATION Bilateral 06/15/2014   Procedure: REPEAT CESAREAN SECTION WITH BILATERAL TUBAL LIGATION;  Surgeon: Brien Few, MD;  Location: [facility];  Service: Obstetrics;  Laterality: Bilateral;  EDD: 06/19/14  . COLONOSCOPY WITH PROPOFOL  08/31/2012  . ROBOTIC ASSISTED SALPINGO OOPHERECTOMY Right 10/30/2018   Procedure: XI ROBOTIC ASSISTED SALPINGO OOPHORECTOMY/With Lysis of Adhesions;  Surgeon: Brien Few, MD;  Location: [facility];  Service: Gynecology;  Laterality: Right;  Requests 2 hrs.  . wisdom teeth ext  teen    FAMILY HISTORY: Family History  Problem Relation Age of Onset  . Diabetes Mother   . Breast cancer Mother        dx 51 and 39, BRCA1+  . Kidney disease Father   . Heart disease Father   . Hypertension Father   . Cancer Maternal Grandmother        breast dx 4s, d. 63s  . Breast cancer Paternal Aunt        dx 50s  . Leukemia Cousin        dx childhood   [name]'s father died from heart disease and kidney failure at age 16. Patients' mother is 26 as of 05/2018, with a history of BRCA1+ and a diagnosis of breast cancer x2. The patient has 1 sister. Patient denies anyone in her family having ovarian, prostate, or pancreatic cancer. [name]'s maternal grandmother was diagnosed with breast cancer. TRUE's maternal uncle and his daughter are also both BRCA1+.    GYNECOLOGIC HISTORY:  No LMP recorded. 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 Her husband, Elta Guadeloupe, works in Press photographer. They have three children, ages 74 (05/2005), 9 (07/2009), and 3 (06/2015).     ADVANCED DIRECTIVES: Her husband, Elta Guadeloupe, is her healthcare power of  attorney in the absence of documents to the contrary.      HEALTH MAINTENANCE: Social History   Tobacco Use  . Smoking status: Never Smoker  . Smokeless tobacco: Never Used  Substance Use Topics  . Alcohol use: Not Currently  . Drug use: No    Colonoscopy: n/a  PAP: up to date  Bone density: n/a Mammography: [facility]. 06/2017.    No Known Allergies  Current Outpatient Medications  Medication Sig Dispense Refill  . gabapentin (NEURONTIN) 300 MG capsule Take 1 capsule (300 mg total) by mouth at bedtime. 30 capsule 3  . ibuprofen (ADVIL) 200 MG tablet Take 200 mg by mouth every 6 (six) hours as needed.    Marland Kitchen levothyroxine (SYNTHROID, LEVOTHROID) 100 MCG tablet Take 100 mcg by mouth daily before breakfast.     . Multiple Vitamins-Minerals (AIRBORNE PO) Take by mouth as needed.    Marland Kitchen oxyCODONE-acetaminophen (PERCOCET/ROXICET) 5-325 MG tablet Take 1-2 tablets by mouth every 4 (four) hours as needed for severe pain. 30 tablet 0   No current facility-administered medications for this visit.      OBJECTIVE: Morbidly obese young white woman in no acute distress  Vitals:   01/09/19 1548  BP: 123/75  Pulse: 69  Resp: 20  Temp: 98.2 F (36.8 C)  SpO2: 99%     Body mass index is 43.55 kg/m.   Wt Readings from Last 3 Encounters:  01/09/19 230 lb 8 oz (104.6 kg)  10/30/18 228 lb 1.6 oz (103.5 kg)  05/24/18 232 lb (105.2 kg)      ECOG FS:1 - Symptomatic but completely ambulatory  Sclerae unicteric, EOMs intact Wearing a mask No cervical or supraclavicular adenopathy Lungs no rales or rhonchi Heart regular rate and rhythm Abd soft, nontender, positive bowel sounds MSK no focal spinal tenderness, no upper extremity lymphedema Neuro: nonfocal, well oriented, appropriate affect Breasts: No masses palpated in either breast, no skin or nipple changes of concern, both axillae are benign.    LAB RESULTS:  CMP     Component Value Date/Time   NA 136 06/14/2014 1045   K 3.7  06/14/2014 1045   CL 106 06/14/2014 1045   CO2 22 06/14/2014 1045   GLUCOSE 116 (H) 06/14/2014 1045   BUN 7 06/14/2014 1045   CREATININE 0.66 06/14/2014 1045   CALCIUM 9.1 06/14/2014 1045   GFRNONAA >90 06/14/2014 1045   GFRAA >90 06/14/2014 1045    No results found for: TOTALPROTELP, ALBUMINELP, A1GS, A2GS, BETS, BETA2SER, GAMS, MSPIKE, SPEI  No results found for: KPAFRELGTCHN, LAMBDASER, KAPLAMBRATIO  Lab Results  Component Value Date   WBC 7.5 10/26/2018   HGB 13.0 10/26/2018   HCT 40.5 10/26/2018   MCV 88.2 10/26/2018   PLT 222 10/26/2018    _0 @  No results found for: LABCA2  No components found for: NIOEVO350  No results for input(s): INR in the last 168 hours.  No results found for: LABCA2  No results found for: KXF818  No results found for: EXH371  No results found for:  WJX914  No results found for: CA2729  No components found for: HGQUANT  No results found for: CEA1 / No results found for: CEA1   No results found for: AFPTUMOR  No results found for: CHROMOGRNA  No results found for: PSA1  No visits with results within 3 Day(s) from this visit.  Latest known visit with results is:  Admission on 10/30/2018, Discharged on 10/30/2018  Component Date Value Ref Range Status  . WBC 10/26/2018 7.5  4.0 - 10.5 K/uL Final  . RBC 10/26/2018 4.59  3.87 - 5.11 MIL/uL Final  . Hemoglobin 10/26/2018 13.0  12.0 - 15.0 g/dL Final  . HCT 10/26/2018 40.5  36.0 - 46.0 % Final  . MCV 10/26/2018 88.2  80.0 - 100.0 fL Final  . MCH 10/26/2018 28.3  26.0 - 34.0 pg Final  . MCHC 10/26/2018 32.1  30.0 - 36.0 g/dL Final  . RDW 10/26/2018 13.2  11.5 - 15.5 % Final  . Platelets 10/26/2018 222  150 - 400 K/uL Final  . nRBC 10/26/2018 0.0  0.0 - 0.2 % Final   Performed at [facility], [place] [address] [place], [place] [address]  . ABO/RH(D) 10/26/2018 O NEG   Final  . Antibody Screen 10/26/2018 NEG   Final  . Sample Expiration 10/26/2018  11/02/2018,2359   Final  . Extend sample reason 10/26/2018    Final                   Value:NO TRANSFUSIONS OR PREGNANCY IN THE PAST 3 MONTHS Performed at [facility], [place] [address] [place], [place] [address]   . Preg Test, Ur 10/30/2018 NEGATIVE  NEGATIVE Final   Comment:        THE SENSITIVITY OF THIS METHODOLOGY IS >24 mIU/mL     (this displays the last labs from the last 3 days)  No results found for: TOTALPROTELP, ALBUMINELP, A1GS, A2GS, BETS, BETA2SER, GAMS, MSPIKE, SPEI (this displays SPEP labs)  No results found for: KPAFRELGTCHN, LAMBDASER, KAPLAMBRATIO (kappa/lambda light chains)  No results found for: HGBA, HGBA2QUANT, HGBFQUANT, HGBSQUAN (Hemoglobinopathy evaluation)   No results found for: LDH  No results found for: IRON, TIBC, IRONPCTSAT (Iron and TIBC)  No results found for: FERRITIN  Urinalysis No results found for: COLORURINE, APPEARANCEUR, LABSPEC, PHURINE, GLUCOSEU, HGBUR, BILIRUBINUR, KETONESUR, PROTEINUR, UROBILINOGEN, NITRITE, LEUKOCYTESUR   STUDIES:  No results found.   ELIGIBLE FOR AVAILABLE RESEARCH PROTOCOL: no   ASSESSMENT: 43 y.o. [place] woman positive for a deleterious BRCA1 mutation, BRCA1 c.213-11T>G (Intronic).  (1) genetics testing 04/12/2018 through the multi-cancer panel offered by in [place] found no additional mutations in AIP, ALK, APC, ATM, AXIN2,BAP1,  BARD1, BLM, BMPR1A, BRCA1, BRCA2, BRIP1, CASR, CDC73, CDH1, CDK4, CDKN1B, CDKN1C, CDKN2A (p14ARF), CDKN2A (p16INK4a), CEBPA, CHEK2, CTNNA1, DICER1, DIS3L2, EGFR (c.2369C>T, p.Thr790Met variant only), EPCAM (Deletion/duplication testing only), FH, FLCN, GATA2, GPC3, GREM1 (Promoter region deletion/duplication testing only), HOXB13 (c.251G>A, p.Gly84Glu), HRAS, KIT, MAX, MEN1, MET, MITF (c.952G>A, p.Glu318Lys variant only), MLH1, MSH2, MSH3, MSH6, MUTYH, NBN, NF1, NF2, NTHL1, PALB2, PDGFRA, PHOX2B, PMS2, POLD1, POLE, POT1, PRKAR1A, PTCH1, PTEN, RAD50, RAD51C,  RAD51D, RB1, RECQL4, RET, RUNX1, SDHAF2, SDHA (sequence changes only), SDHB, SDHC, SDHD, SMAD4, SMARCA4, SMARCB1, SMARCE1, STK11, SUFU, TERC, TERT, TMEM127, TP53, TSC1, TSC2, VHL, WRN and WT1.  (2) risk reduction:   (a) status post left salpingectomy and right salpingooophorectomy 10/30/2018 with benign pathology   (i) patient had congenital absence of left ovary   (b) opted against anti- estrogens  (  c) considering bilateral mastectomies  (3) intensified screening:  (a) mammography April, breast MRI October and biannual MD breast exam  (4) menopausal symptoms  (a) on gabapentin po Qhs  (b) staring venlafaxine-XR 37.5 mg 01/09/2019   PLAN: Venita met with Dr. Iran Planas since her last visit with me and has a good grasp of her surgical options.  She understands Dr. Iran Planas to have told her she is not a very good candidate for nipple sparing mastectomies but that with a preliminary procedure that might become more of an option.  [place] would like to meet with Dr. Abelino Derrick at [facility] but right now he is not seeing new patients.  She did well with her salpingo-oophorectomy, but is having significant menopausal symptoms.  Moodiness and hot flashes are the main issue.  I think both would improve with some venlafaxine and we talked about the possible toxicity side effects and complications of this agent.  She will start with 37.5 mg and if after 2 weeks she tolerates it well and think she is getting some benefit she will increase to 75 mg daily.  She is benefiting from the gabapentin she takes at bedtime  I have encouraged her to continue to exercise on a regular basis  She is already scheduled for MRI of the breast next week.  She will have mammography in May and I will see her in June unless she thinks she has no questions at that time in which case I would move that visit until after MRI next year  She knows to call for any other issues that may develop before the next visit.  Magrinat,  Virgie Dad, MD  01/09/19 4:52 PM Medical Oncology and Hematology [facility] [place], [place] [address] Tel. [phone]    Fax. [phone]    I, Wilburn Mylar, am acting as scribe for Dr. Virgie Dad. Magrinat.  I, Lurline Del MD, have reviewed the above documentation for accuracy and completeness, and I agree with the above.

## 2019-01-09 ENCOUNTER — Inpatient Hospital Stay: Payer: BC Managed Care – PPO | Attending: Oncology | Admitting: Oncology

## 2019-01-09 ENCOUNTER — Other Ambulatory Visit: Payer: Self-pay

## 2019-01-09 VITALS — BP 123/75 | HR 69 | Temp 98.2°F | Resp 20 | Ht 61.0 in | Wt 230.5 lb

## 2019-01-09 DIAGNOSIS — O24419 Gestational diabetes mellitus in pregnancy, unspecified control: Secondary | ICD-10-CM | POA: Diagnosis not present

## 2019-01-09 DIAGNOSIS — Z9079 Acquired absence of other genital organ(s): Secondary | ICD-10-CM | POA: Insufficient documentation

## 2019-01-09 DIAGNOSIS — Z90721 Acquired absence of ovaries, unilateral: Secondary | ICD-10-CM | POA: Diagnosis not present

## 2019-01-09 DIAGNOSIS — E039 Hypothyroidism, unspecified: Secondary | ICD-10-CM | POA: Diagnosis not present

## 2019-01-09 DIAGNOSIS — Z1509 Genetic susceptibility to other malignant neoplasm: Secondary | ICD-10-CM | POA: Diagnosis not present

## 2019-01-09 DIAGNOSIS — Z803 Family history of malignant neoplasm of breast: Secondary | ICD-10-CM | POA: Insufficient documentation

## 2019-01-09 DIAGNOSIS — Z1501 Genetic susceptibility to malignant neoplasm of breast: Secondary | ICD-10-CM | POA: Diagnosis not present

## 2019-01-09 DIAGNOSIS — N951 Menopausal and female climacteric states: Secondary | ICD-10-CM | POA: Diagnosis not present

## 2019-01-09 MED ORDER — VENLAFAXINE HCL ER 37.5 MG PO CP24: ORAL_CAPSULE | ORAL | 0 refills | Status: DC

## 2019-01-09 MED ORDER — GABAPENTIN 300 MG PO CAPS: 300.0000 mg | ORAL_CAPSULE | Freq: Every day | ORAL | 4 refills | Status: DC

## 2019-01-09 NOTE — Addendum Note (Signed)
Addended by: Chauncey Cruel on: 01/09/2019 05:29 PM   Modules accepted: Orders

## 2019-01-11 ENCOUNTER — Telehealth: Payer: Self-pay | Admitting: Oncology

## 2019-01-11 NOTE — Telephone Encounter (Signed)
I talk with patient regarding schedule  

## 2019-01-19 ENCOUNTER — Other Ambulatory Visit: Payer: Self-pay

## 2019-01-19 ENCOUNTER — Ambulatory Visit
Admission: RE | Admit: 2019-01-19 | Discharge: 2019-01-19 | Disposition: A | Discharge status: Home / Self Care | Payer: BC Managed Care – PPO | Source: Ambulatory Visit | Attending: Oncology | Admitting: Oncology

## 2019-01-19 DIAGNOSIS — Z1501 Genetic susceptibility to malignant neoplasm of breast: Secondary | ICD-10-CM | POA: Diagnosis not present

## 2019-01-19 DIAGNOSIS — Z1231 Encounter for screening mammogram for malignant neoplasm of breast: Secondary | ICD-10-CM

## 2019-01-19 MED ORDER — GADOBUTROL 1 MMOL/ML IV SOLN
10.0000 mL | Freq: Once | INTRAVENOUS | Status: AC | PRN
  Administered 2019-01-19: 10 mL via INTRAVENOUS

## 2019-01-24 ENCOUNTER — Telehealth: Payer: Self-pay | Admitting: *Deleted

## 2019-01-24 NOTE — Telephone Encounter (Signed)
This RN returned VM from pt who was requesting results of MRI done 11/6.  Per return call- obtained identified answering machine. Message left regarding results.

## 2019-01-29 DIAGNOSIS — M25562 Pain in left knee: Secondary | ICD-10-CM | POA: Diagnosis not present

## 2019-02-02 ENCOUNTER — Other Ambulatory Visit: Payer: Self-pay | Admitting: Oncology

## 2019-02-10 DIAGNOSIS — M25562 Pain in left knee: Secondary | ICD-10-CM | POA: Diagnosis not present

## 2019-02-11 DIAGNOSIS — Z20828 Contact with and (suspected) exposure to other viral communicable diseases: Secondary | ICD-10-CM | POA: Diagnosis not present

## 2019-02-22 DIAGNOSIS — S83242A Other tear of medial meniscus, current injury, left knee, initial encounter: Secondary | ICD-10-CM | POA: Diagnosis not present

## 2019-02-22 DIAGNOSIS — M25562 Pain in left knee: Secondary | ICD-10-CM | POA: Diagnosis not present

## 2019-02-22 DIAGNOSIS — M1712 Unilateral primary osteoarthritis, left knee: Secondary | ICD-10-CM | POA: Diagnosis not present

## 2019-03-12 DIAGNOSIS — M1712 Unilateral primary osteoarthritis, left knee: Secondary | ICD-10-CM | POA: Diagnosis not present

## 2019-04-16 DIAGNOSIS — E039 Hypothyroidism, unspecified: Secondary | ICD-10-CM | POA: Diagnosis not present

## 2019-04-16 DIAGNOSIS — F419 Anxiety disorder, unspecified: Secondary | ICD-10-CM | POA: Diagnosis not present

## 2019-04-16 DIAGNOSIS — Z6841 Body Mass Index (BMI) 40.0 and over, adult: Secondary | ICD-10-CM | POA: Diagnosis not present

## 2019-04-16 DIAGNOSIS — Z8639 Personal history of other endocrine, nutritional and metabolic disease: Secondary | ICD-10-CM | POA: Diagnosis not present

## 2019-04-16 DIAGNOSIS — Z1151 Encounter for screening for human papillomavirus (HPV): Secondary | ICD-10-CM | POA: Diagnosis not present

## 2019-04-16 DIAGNOSIS — E119 Type 2 diabetes mellitus without complications: Secondary | ICD-10-CM | POA: Diagnosis not present

## 2019-04-16 DIAGNOSIS — Z01419 Encounter for gynecological examination (general) (routine) without abnormal findings: Secondary | ICD-10-CM | POA: Diagnosis not present

## 2019-04-16 DIAGNOSIS — L900 Lichen sclerosus et atrophicus: Secondary | ICD-10-CM | POA: Diagnosis not present

## 2019-04-21 DIAGNOSIS — Z03818 Encounter for observation for suspected exposure to other biological agents ruled out: Secondary | ICD-10-CM | POA: Diagnosis not present

## 2019-04-21 DIAGNOSIS — Z20828 Contact with and (suspected) exposure to other viral communicable diseases: Secondary | ICD-10-CM | POA: Diagnosis not present

## 2019-06-01 DIAGNOSIS — Z8639 Personal history of other endocrine, nutritional and metabolic disease: Secondary | ICD-10-CM | POA: Diagnosis not present

## 2019-06-07 ENCOUNTER — Ambulatory Visit: Payer: BC Managed Care – PPO | Attending: Internal Medicine

## 2019-06-07 DIAGNOSIS — Z23 Encounter for immunization: Secondary | ICD-10-CM

## 2019-06-07 NOTE — Progress Notes (Signed)
   Covid-19 Vaccination Clinic  Name:  [name]    MRN: [id number] DOB: May 31, 1975  06/07/2019  [name] was observed post Covid-19 immunization for 15 minutes without incident. She was provided with Vaccine Information Sheet and instruction to access the V-Safe system.   [name] was instructed to call 911 with any severe reactions post vaccine: Marland Kitchen Difficulty breathing  . Swelling of face and throat  . A fast heartbeat  . A bad rash all over body  . Dizziness and weakness   Immunizations Administered    Name Date Dose VIS Date Route   Pfizer COVID-19 Vaccine 06/07/2019 12:38 PM 0.3 mL 02/23/2019 Intramuscular   Manufacturer: [employer], [employer]   Lot: [id number]   NDC: [address]-8

## 2019-07-02 ENCOUNTER — Ambulatory Visit: Payer: BC Managed Care – PPO | Attending: Internal Medicine

## 2019-07-02 DIAGNOSIS — Z23 Encounter for immunization: Secondary | ICD-10-CM

## 2019-07-02 NOTE — Progress Notes (Signed)
   Covid-19 Vaccination Clinic  Name:  [name]    MRN: [id number] DOB: Dec 29, 1975  07/02/2019  [name] was observed post Covid-19 immunization for 15 minutes without incident. She was provided with Vaccine Information Sheet and instruction to access the V-Safe system.   [name] was instructed to call 911 with any severe reactions post vaccine: Marland Kitchen Difficulty breathing  . Swelling of face and throat  . A fast heartbeat  . A bad rash all over body  . Dizziness and weakness   Immunizations Administered    Name Date Dose VIS Date Route   Pfizer COVID-19 Vaccine 07/02/2019 10:17 AM 0.3 mL 05/09/2018 Intramuscular   Manufacturer: [employer], [employer]   Lot: [id number]   NDC: [address]-4

## 2019-07-20 DIAGNOSIS — Z8639 Personal history of other endocrine, nutritional and metabolic disease: Secondary | ICD-10-CM | POA: Diagnosis not present

## 2019-08-14 DIAGNOSIS — Z1231 Encounter for screening mammogram for malignant neoplasm of breast: Secondary | ICD-10-CM | POA: Diagnosis not present

## 2019-08-19 NOTE — Progress Notes (Signed)
[place]  [phone] [phone]    ID: [name] DOB: January 05, 1976  MR#: [id number]  SWF#:[id number]  Patient Care Team: Brien Few, MD as PCP - General (Obstetrics and Gynecology) Ariaunna Longsworth, Virgie Dad, MD as Consulting Physician (Oncology) Irene Limbo, MD as Consulting Physician (Plastic Surgery) Ria Clock, MD as Attending Physician (Radiology) OTHER MD:   CHIEF COMPLAINT: BRCA1 positive  CURRENT TREATMENT: Intensified screening   INTERVAL HISTORY: [name] returns today for follow up of her BRCA1 mutation/breast cancer risk.  Since her last visit, she underwent bilateral breast MRI on 01/19/2019 showing: breast composition B; no abnormal enhancement in either breast to suggest malignancy.  She was also tested for Covid-19 on 02/11/2019 and 04/21/2019, both of which were negative. She subsequently received the Covid-19 vaccine in two doses on 3/25 and 07/02/2019.  On 08/14/2019 she underwent mammography at [facility] showing breast density category A.  There were no masses of concern.   REVIEW OF SYSTEMS: [name] is benefiting from the gabapentin and when she did not take it 1 night she said it was "horrible".  On the other hand she took the venlafaxine 1 time and it made her feel strange so she is not on that medication.  She tells me she can deal with the daytime hot flashes.  She is walking up to 5 miles just about every day.  She is trying to eat fewer carbs and lose some weight in preparation for her eventual bilateral mastectomies which she thinks may be next year or may be later.  She is having some vaginal dryness issues.  Otherwise a detailed review of systems today was stable   HISTORY OF CURRENT ILLNESS: From the original intake note:  [name] was recently diagnosed as BRCA 1 positive at c.213-11T>G on 04/05/2018 through the [place].  She is here today to discuss management options.  For a  full account of her family history and a pedigree please refer to the genetics counseling note dated 05/04/2018  The patient's subsequent history is as detailed below.   PAST MEDICAL HISTORY: Past Medical History:  Diagnosis Date  . BRCA1 gene mutation positive    dx 01/ 2020  . Family history of breast cancer   . Hemorrhoids   . History of gestational diabetes   . 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 CESAREAN SECTION  2007 & 2010   x 2 - at [facility]  . CESAREAN SECTION WITH BILATERAL TUBAL LIGATION Bilateral 06/15/2014   Procedure: REPEAT CESAREAN SECTION WITH BILATERAL TUBAL LIGATION;  Surgeon: Brien Few, MD;  Location: [facility];  Service: Obstetrics;  Laterality: Bilateral;  EDD: 06/19/14  . COLONOSCOPY WITH PROPOFOL  08/31/2012  . ROBOTIC ASSISTED SALPINGO OOPHERECTOMY Right 10/30/2018   Procedure: XI ROBOTIC ASSISTED SALPINGO OOPHORECTOMY/With Lysis of Adhesions;  Surgeon: Brien Few, MD;  Location: [facility];  Service: Gynecology;  Laterality: Right;  Requests 2 hrs.  . wisdom teeth ext  teen    FAMILY HISTORY: Family History  Problem Relation Age of Onset  . Diabetes Mother   . Breast cancer Mother        dx 43 and 65, BRCA1+  . Kidney disease Father   . Heart disease Father   . Hypertension Father   . Cancer Maternal Grandmother        breast  dx 31s, d. 39s  . Breast cancer Paternal Aunt        dx 94s  . Leukemia Cousin        dx childhood   Bently's father died from heart disease and kidney failure at age 37. Patients' mother is 84 as of 05/2018, with a history of BRCA1+ and a diagnosis of breast cancer x2. The patient has 1 sister. Patient denies anyone in her family having ovarian, prostate, or pancreatic cancer. [name] maternal grandmother was diagnosed with breast cancer. [name]'s maternal uncle and his  daughter are also both BRCA1+.    GYNECOLOGIC HISTORY:  No LMP recorded. 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 Her husband, Elta Guadeloupe, works in Press photographer. They have three children, ages 42 (05/2005), 9 (07/2009), and 3 (06/2015).     ADVANCED DIRECTIVES: Her husband, Elta Guadeloupe, is her healthcare power of attorney in the absence of documents to the contrary.      HEALTH MAINTENANCE: Social History   Tobacco Use  . Smoking status: Never Smoker  . Smokeless tobacco: Never Used  Substance Use Topics  . Alcohol use: Not Currently  . Drug use: No    Colonoscopy: n/a  PAP: up to date  Bone density: n/a Mammography: [facility]. 06/2017.    No Known Allergies  Current Outpatient Medications  Medication Sig Dispense Refill  . gabapentin (NEURONTIN) 300 MG capsule Take 1 capsule (300 mg total) by mouth at bedtime. 90 capsule 4  . ibuprofen (ADVIL) 200 MG tablet Take 200 mg by mouth every 6 (six) hours as needed.    Marland Kitchen levothyroxine (SYNTHROID, LEVOTHROID) 100 MCG tablet Take 100 mcg by mouth daily before breakfast.     . Multiple Vitamins-Minerals (AIRBORNE PO) Take by mouth as needed.    . venlafaxine XR (EFFEXOR-XR) 37.5 MG 24 hr capsule TAKE 1 CAPSULE BY MOUTH EVERY DAY WITH BREAKFAST FOR 2 WEEKS, THEN TAKE 2 CAPSULES DAILY WITH BREAKFAST 240 capsule 1   No current facility-administered medications for this visit.    OBJECTIVE: white woman in no acute distress  Vitals:   08/20/19 1458  BP: 123/77  Pulse: 64  Resp: 18  Temp: 98.5 F (36.9 C)  SpO2: 99%     Body mass index is 36.45 kg/m.   Wt Readings from Last 3 Encounters:  08/20/19 192 lb 14.4 oz (87.5 kg)  01/09/19 230 lb 8 oz (104.6 kg)  10/30/18 228 lb 1.6 oz (103.5 kg)      ECOG FS:1 -  Symptomatic but completely ambulatory  Sclerae unicteric, EOMs intact Wearing a mask No cervical or supraclavicular adenopathy Lungs no rales or rhonchi Heart regular rate and rhythm Abd soft, nontender, positive bowel sounds MSK no focal spinal tenderness, no upper extremity lymphedema Neuro: nonfocal, well oriented, appropriate affect Breasts: I do not palpate a mass in either breast, there are no skin or nipple changes of concern, both axillae are benign.   LAB RESULTS:  CMP     Component Value Date/Time   NA 136 06/14/2014 1045   K 3.7 06/14/2014 1045   CL 106 06/14/2014 1045   CO2 22 06/14/2014 1045   GLUCOSE 116 (H) 06/14/2014 1045   BUN 7 06/14/2014 1045   CREATININE 0.66 06/14/2014 1045  CALCIUM 9.1 06/14/2014 1045   GFRNONAA >90 06/14/2014 1045   GFRAA >90 06/14/2014 1045    No results found for: TOTALPROTELP, ALBUMINELP, A1GS, A2GS, BETS, BETA2SER, GAMS, MSPIKE, SPEI  No results found for: KPAFRELGTCHN, LAMBDASER, KAPLAMBRATIO  Lab Results  Component Value Date   WBC 7.5 10/26/2018   HGB 13.0 10/26/2018   HCT 40.5 10/26/2018   MCV 88.2 10/26/2018   PLT 222 10/26/2018   No results found for: LABCA2  No components found for: EOFHQR975  No results for input(s): INR in the last 168 hours.  No results found for: LABCA2  No results found for: OIT254  No results found for: DIY641  No results found for: RAX094  No results found for: CA2729  No components found for: HGQUANT  No results found for: CEA1 / No results found for: CEA1   No results found for: AFPTUMOR  No results found for: CHROMOGRNA  No results found for: HGBA, HGBA2QUANT, HGBFQUANT, HGBSQUAN (Hemoglobinopathy evaluation)   No results found for: LDH  No results found for: IRON, TIBC, IRONPCTSAT (Iron and TIBC)  No results found for: FERRITIN  Urinalysis No results found for: COLORURINE, APPEARANCEUR, LABSPEC, PHURINE, GLUCOSEU, HGBUR, BILIRUBINUR, KETONESUR, PROTEINUR,  UROBILINOGEN, NITRITE, LEUKOCYTESUR   STUDIES:  No results found.   ELIGIBLE FOR AVAILABLE RESEARCH PROTOCOL: no   ASSESSMENT: 44 y.o. [place], [place] woman positive for a deleterious BRCA1 mutation, BRCA1 c.213-11T>G (Intronic).  (1) genetics testing 04/12/2018 through the multi-cancer panel offered by in [place] found no additional mutations in AIP, ALK, APC, ATM, AXIN2,BAP1,  BARD1, BLM, BMPR1A, BRCA1, BRCA2, BRIP1, CASR, CDC73, CDH1, CDK4, CDKN1B, CDKN1C, CDKN2A (p14ARF), CDKN2A (p16INK4a), CEBPA, CHEK2, CTNNA1, DICER1, DIS3L2, EGFR (c.2369C>T, Randolph.Thr790Met variant only), EPCAM (Deletion/duplication testing only), FH, FLCN, GATA2, GPC3, GREM1 (Promoter region deletion/duplication testing only), HOXB13 (c.251G>A, Randolph.Gly84Glu), HRAS, KIT, MAX, MEN1, MET, MITF (c.952G>A, Randolph.Glu318Lys variant only), MLH1, MSH2, MSH3, MSH6, MUTYH, NBN, NF1, NF2, NTHL1, PALB2, PDGFRA, PHOX2B, PMS2, POLD1, POLE, POT1, PRKAR1A, PTCH1, PTEN, RAD50, RAD51C, RAD51D, RB1, RECQL4, RET, RUNX1, SDHAF2, SDHA (sequence changes only), SDHB, SDHC, SDHD, SMAD4, SMARCA4, SMARCB1, SMARCE1, STK11, SUFU, TERC, TERT, TMEM127, TP53, TSC1, TSC2, VHL, WRN and WT1.  (2) risk reduction:   (a) status post left salpingectomy and right salpingooophorectomy 10/30/2018 with benign pathology   (i) patient had congenital absence of left ovary   (b) opted against anti- estrogens  (c) considering bilateral mastectomies  (3) intensified screening:  (a) mammography April, breast MRI October and biannual MD breast exam  (4) menopausal symptoms  (a) on gabapentin po Qhs  (b) opted against venlafaxine  (c) considering pelvic rehabilitation   PLAN: Delainie has managed to lose about 30 pounds by did not of cutting back on carbohydrates and increasing her exercises.  I strongly commended her walking 4 to 5 miles just about every day.  That is so terrific for her wellbeing, fitness, mood, bones, as well as her general health.  Her hope of course is that  she may become a good candidate for nipple sparing mastectomies at some point.  She has pretty much decided against D IEP reconstruction  We reviewed her mammogram which is favorable, with the density a.  She will have an MRI again this winter.  She really cannot afford the $2500 and we are going to do an abbreviated MRI this time.  I think she would benefit from the pelvic rehab program and I gave her the brochure.  She will let me know if she wants me to put in  the referral.   Otherwise she will see me again in a year, after her mammography at [facility] next June  Total encounter time 25 minutes.*   Chestina Komatsu, Virgie Dad, MD  08/20/19 3:40 PM Medical Oncology and Hematology [facility] [place], [place] [address] Tel. [phone]    Fax. [phone]    I, Wilburn Mylar, am acting as scribe for Dr. Virgie Dad. [name].  I, Lurline Del MD, have reviewed the above documentation for accuracy and completeness, and I agree with the above.   *Total Encounter Time as defined by the Centers for Medicare and Medicaid Services includes, in addition to the face-to-face time of a patient visit (documented in the note above) non-face-to-face time: obtaining and reviewing outside history, ordering and reviewing medications, tests or procedures, care coordination (communications with other health care professionals or caregivers) and documentation in the medical record.

## 2019-08-20 ENCOUNTER — Inpatient Hospital Stay: Payer: BC Managed Care – PPO | Attending: Oncology | Admitting: Oncology

## 2019-08-20 ENCOUNTER — Telehealth: Payer: Self-pay | Admitting: Oncology

## 2019-08-20 ENCOUNTER — Other Ambulatory Visit: Payer: Self-pay

## 2019-08-20 VITALS — BP 123/77 | HR 64 | Temp 98.5°F | Resp 18 | Ht 61.0 in | Wt 192.9 lb

## 2019-08-20 DIAGNOSIS — N951 Menopausal and female climacteric states: Secondary | ICD-10-CM | POA: Insufficient documentation

## 2019-08-20 DIAGNOSIS — Z803 Family history of malignant neoplasm of breast: Secondary | ICD-10-CM | POA: Insufficient documentation

## 2019-08-20 DIAGNOSIS — Z1501 Genetic susceptibility to malignant neoplasm of breast: Secondary | ICD-10-CM

## 2019-08-20 DIAGNOSIS — N898 Other specified noninflammatory disorders of vagina: Secondary | ICD-10-CM | POA: Diagnosis not present

## 2019-08-20 DIAGNOSIS — Z1239 Encounter for other screening for malignant neoplasm of breast: Secondary | ICD-10-CM

## 2019-08-20 DIAGNOSIS — Z1509 Genetic susceptibility to other malignant neoplasm: Secondary | ICD-10-CM | POA: Diagnosis not present

## 2019-08-20 MED ORDER — GABAPENTIN 300 MG PO CAPS: 300.0000 mg | ORAL_CAPSULE | Freq: Every day | ORAL | 4 refills | Status: DC

## 2019-08-20 NOTE — Addendum Note (Signed)
Addended by: Lowella Dell on: 08/20/2019 03:44 PM   Modules accepted: Orders

## 2019-08-20 NOTE — Telephone Encounter (Signed)
Scheduled appts per 6/7 los. Pt declined print out of AVS.

## 2019-11-14 DIAGNOSIS — R197 Diarrhea, unspecified: Secondary | ICD-10-CM | POA: Diagnosis not present

## 2019-11-14 DIAGNOSIS — Z20822 Contact with and (suspected) exposure to covid-19: Secondary | ICD-10-CM | POA: Diagnosis not present

## 2019-11-29 DIAGNOSIS — Z20822 Contact with and (suspected) exposure to covid-19: Secondary | ICD-10-CM | POA: Diagnosis not present

## 2020-02-14 ENCOUNTER — Ambulatory Visit
Admission: RE | Admit: 2020-02-14 | Discharge: 2020-02-14 | Disposition: A | Discharge status: Home / Self Care | Payer: BC Managed Care – PPO | Source: Ambulatory Visit | Attending: Oncology | Admitting: Oncology

## 2020-02-14 ENCOUNTER — Other Ambulatory Visit: Payer: Self-pay

## 2020-02-14 DIAGNOSIS — Z1239 Encounter for other screening for malignant neoplasm of breast: Secondary | ICD-10-CM

## 2020-02-14 DIAGNOSIS — K7689 Other specified diseases of liver: Secondary | ICD-10-CM | POA: Diagnosis not present

## 2020-02-14 DIAGNOSIS — Z1501 Genetic susceptibility to malignant neoplasm of breast: Secondary | ICD-10-CM

## 2020-02-14 DIAGNOSIS — Z1509 Genetic susceptibility to other malignant neoplasm: Secondary | ICD-10-CM

## 2020-02-14 MED ORDER — GADOBUTROL 1 MMOL/ML IV SOLN
5.0000 mL | Freq: Once | INTRAVENOUS | Status: AC | PRN
  Administered 2020-02-14: 5 mL via INTRAVENOUS

## 2020-02-15 ENCOUNTER — Telehealth: Payer: Self-pay

## 2020-02-15 NOTE — Telephone Encounter (Signed)
-----   Message from Loa Socks, NP sent at 02/14/2020 11:54 AM EST ----- Normal MRI.  Please notify patient.   ----- Message ----- From: Leory Plowman, Rad Results In Sent: 02/14/2020  11:13 AM EST To: Lowella Dell, MD

## 2020-02-15 NOTE — Telephone Encounter (Signed)
Pt was given MRI results per Lillard Anes, NP. Pt verbalized thanks and understanding.

## 2020-05-07 DIAGNOSIS — L900 Lichen sclerosus et atrophicus: Secondary | ICD-10-CM | POA: Insufficient documentation

## 2020-05-07 DIAGNOSIS — N951 Menopausal and female climacteric states: Secondary | ICD-10-CM | POA: Insufficient documentation

## 2020-05-12 ENCOUNTER — Other Ambulatory Visit: Payer: Self-pay | Admitting: Oncology

## 2020-07-06 ENCOUNTER — Other Ambulatory Visit: Payer: Self-pay | Admitting: Oncology

## 2020-07-07 ENCOUNTER — Telehealth: Payer: Self-pay | Admitting: Oncology

## 2020-07-07 NOTE — Telephone Encounter (Signed)
R/s appt per 4/24 sch msg. Pt aware.  

## 2020-08-14 DIAGNOSIS — Z1231 Encounter for screening mammogram for malignant neoplasm of breast: Secondary | ICD-10-CM | POA: Diagnosis not present

## 2020-08-21 ENCOUNTER — Ambulatory Visit: Payer: BC Managed Care – PPO | Admitting: Oncology

## 2020-08-28 ENCOUNTER — Encounter: Payer: Self-pay | Admitting: Oncology

## 2020-09-01 ENCOUNTER — Other Ambulatory Visit: Payer: Self-pay | Admitting: Oncology

## 2020-09-17 NOTE — Progress Notes (Signed)
[name]  Telephone:(336) [phone] Fax:(336) [phone]    ID: [name] DOB: 06-30-1975  MR#: [id number]  RFF#:[id number]  Patient Care Team: Brien Few, MD as PCP - General (Obstetrics and Gynecology) Sangita Zani, Virgie Dad, MD as Consulting Physician (Oncology) Irene Limbo, MD as Consulting Physician (Plastic Surgery) Ria Clock, MD as Attending Physician (Radiology) OTHER MD:   CHIEF COMPLAINT: BRCA1 positive  CURRENT TREATMENT: Intensified screening   INTERVAL HISTORY: [name] returns today for follow up of her BRCA1 mutation/breast cancer risk.  Since her last visit, she underwent screening breast MRI on 02/14/2020 showing: breast composition B; no evidence of malignancy.  She also underwent screening mammography with tomography at [facility] on 08/14/2020 showing: breast density category A; no evidence of malignancy in either breast.    REVIEW OF SYSTEMS: [name] walks 5 miles just about every day.  If the weather is bad she uses her treadmill otherwise she walks around her neighborhood and on the weekends she does trails.  She continues to enjoy her work and particularly her coworkers.  Detailed review of systems today was otherwise noncontributory   COVID 19 VACCINATION STATUS: [place] x2, most recently 06/2019; likely had COVID January 2022; no booster as of July 2022   HISTORY OF CURRENT ILLNESS: From the original intake note:  [name] was recently diagnosed as BRCA 1 positive at c.213-11T>G on 04/05/2018 through the [place].  She is here today to discuss management options.  For a full account of her family history and a pedigree please refer to the genetics counseling note dated 05/04/2018  The patient's subsequent history is as detailed below.   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   PAST SURGICAL HISTORY: Past Surgical History:  Procedure Laterality Date   CESAREAN SECTION  2007 & 2010   x 2 - at [place] Bilateral 06/15/2014   Procedure: REPEAT CESAREAN SECTION WITH BILATERAL TUBAL LIGATION;  Surgeon: Brien Few, MD;  Location: [facility];  Service: Obstetrics;  Laterality: Bilateral;  EDD: 06/19/14   COLONOSCOPY WITH PROPOFOL  08/31/2012   ROBOTIC ASSISTED SALPINGO OOPHERECTOMY Right 10/30/2018   Procedure: XI ROBOTIC ASSISTED SALPINGO OOPHORECTOMY/With Lysis of Adhesions;  Surgeon: Brien Few, MD;  [facility];  Service: Gynecology;  Laterality: Right;  Requests 2 hrs.   wisdom teeth ext  teen    FAMILY HISTORY: Family History  Problem Relation Age of Onset   Diabetes Mother    Breast cancer Mother        dx 39 and 29, BRCA1+   Kidney disease Father    Heart disease Father    Hypertension Father    Cancer Maternal Grandmother        breast dx 35s, d. 31s   Breast cancer Paternal Aunt        dx 28s   Leukemia Cousin        dx childhood   [name]'s father died from heart disease and kidney failure at age 69. Patients' mother is 5 as of 05/2018, with a history of BRCA1+ and a diagnosis of breast cancer x2. The patient has 1 sister. Patient denies  anyone in her family having ovarian, prostate, or pancreatic cancer. [name]'s maternal grandmother was diagnosed with breast cancer. [name]'s maternal uncle and his daughter are also both BRCA1+.    GYNECOLOGIC HISTORY:  No LMP recorded. 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  Her husband, Loraine Leriche, works in Airline pilot. They have three children, ages 41 (05/2005), 9 (07/2009), and 3 (06/2015).     ADVANCED DIRECTIVES: Her husband, Loraine Leriche, is her healthcare power of attorney in the absence of documents to the contrary.      HEALTH MAINTENANCE: Social History   Tobacco Use   Smoking status: Never   Smokeless tobacco: Never  Vaping Use   Vaping Use: Never used  Substance Use Topics   Alcohol use: Not Currently   Drug use: No    Colonoscopy: n/a  PAP: up to date  Bone density: n/a Mammography: [facility]. 06/2017.    No Known Allergies  Current Outpatient Medications  Medication Sig Dispense Refill   gabapentin (NEURONTIN) 300 MG capsule TAKE 1 CAPSULE BY MOUTH EVERY NIGHT AT BEDTIME 240 capsule 0   ibuprofen (ADVIL) 200 MG tablet Take 200 mg by mouth every 6 (six) hours as needed.     levothyroxine (SYNTHROID, LEVOTHROID) 100 MCG tablet Take 100 mcg by mouth daily before breakfast.      Multiple Vitamins-Minerals (AIRBORNE PO) Take by mouth as needed.     No current facility-administered medications for this visit.    OBJECTIVE: white woman who appears well  Vitals:   09/18/20 1105  BP: 123/86  Pulse: (!) 58  Resp: 16  Temp: 97.7 F (36.5 C)  SpO2: 100%      Body mass index is 35.37 kg/m.   Wt Readings from Last 3 Encounters:  09/18/20 187 lb 3.2 oz (84.9 kg)  08/20/19 192 lb 14.4 oz (87.5 kg)  01/09/19 230 lb 8 oz (104.6 kg)      ECOG FS:0 - Asymptomatic  Sclerae unicteric, EOMs intact Wearing a mask No cervical or supraclavicular adenopathy Lungs no rales or rhonchi Heart regular rate and rhythm Abd soft, nontender, positive bowel sounds MSK no focal spinal tenderness, no upper extremity lymphedema Neuro: nonfocal, well oriented, appropriate affect Breasts: No masses palpated, no skin or nipple changes of concern.  Both axillae are benign.      LAB RESULTS:  CMP     Component Value Date/Time   NA 136 06/14/2014 1045   K 3.7  06/14/2014 1045   CL 106 06/14/2014 1045   CO2 22 06/14/2014 1045   GLUCOSE 116 (H) 06/14/2014 1045   BUN 7 06/14/2014 1045   CREATININE 0.66 06/14/2014 1045   CALCIUM 9.1 06/14/2014 1045   GFRNONAA >90 06/14/2014 1045   GFRAA >90 06/14/2014 1045    No results found for: TOTALPROTELP, ALBUMINELP, A1GS, A2GS, BETS, BETA2SER, GAMS, MSPIKE, SPEI  No results found for: KPAFRELGTCHN, LAMBDASER, KAPLAMBRATIO  Lab Results  Component Value Date   WBC 7.5 10/26/2018   HGB 13.0 10/26/2018   HCT 40.5 10/26/2018   MCV 88.2 10/26/2018   PLT 222 10/26/2018   No results found for: LABCA2  No components found for: [id number]  No results for input(s): INR in the last 168 hours.  No results found for: LABCA2  No results found for: UYM980  No results found for: YUN895  No results found for: QBD567  No results found for: CA2729  No components found for: HGQUANT  No results found for: CEA1 / No results found for: CEA1   No results found for: AFPTUMOR  No results found for: CHROMOGRNA  No results found for: HGBA, HGBA2QUANT, HGBFQUANT, HGBSQUAN (Hemoglobinopathy evaluation)   No results found for: LDH  No results found for: IRON, TIBC, IRONPCTSAT (Iron and TIBC)  No results found for: FERRITIN  Urinalysis No results found for: COLORURINE, APPEARANCEUR, LABSPEC, PHURINE, GLUCOSEU, HGBUR, BILIRUBINUR, KETONESUR, PROTEINUR, UROBILINOGEN, NITRITE, LEUKOCYTESUR   STUDIES:  No results found.   ELIGIBLE FOR AVAILABLE RESEARCH PROTOCOL: no   ASSESSMENT: 45 y.o. [place], [place] woman positive for a deleterious BRCA1 mutation, BRCA1 c.213-11T>G (Intronic).  (1) genetics testing 04/12/2018 through the multi-cancer panel offered by in [place] found no additional mutations in AIP, ALK, APC, ATM, AXIN2,BAP1,  BARD1, BLM, BMPR1A, BRCA1, BRCA2, BRIP1, CASR, CDC73, CDH1, CDK4, CDKN1B, CDKN1C, CDKN2A (p14ARF), CDKN2A (p16INK4a), CEBPA, CHEK2, CTNNA1, DICER1, DIS3L2, EGFR (c.2369C>T,  p.Thr790Met variant only), EPCAM (Deletion/duplication testing only), FH, FLCN, GATA2, GPC3, GREM1 (Promoter region deletion/duplication testing only), HOXB13 (c.251G>A, p.Gly84Glu), HRAS, KIT, MAX, MEN1, MET, MITF (c.952G>A, p.Glu318Lys variant only), MLH1, MSH2, MSH3, MSH6, MUTYH, NBN, NF1, NF2, NTHL1, PALB2, PDGFRA, PHOX2B, PMS2, POLD1, POLE, POT1, PRKAR1A, PTCH1, PTEN, RAD50, RAD51C, RAD51D, RB1, RECQL4, RET, RUNX1, SDHAF2, SDHA (sequence changes only), SDHB, SDHC, SDHD, SMAD4, SMARCA4, SMARCB1, SMARCE1, STK11, SUFU, TERC, TERT, TMEM127, TP53, TSC1, TSC2, VHL, WRN and WT1.  (2) risk reduction:   (a) status post left salpingectomy and right salpingooophorectomy 10/30/2018 with benign pathology   (i) patient had congenital absence of left ovary   (b) opted against anti- estrogens  (c) considering bilateral mastectomies  (3) intensified screening:  (a) mammography  breast MRI December and biannual MD breast exam  (4) menopausal symptoms  (a) on gabapentin po Qhs  (b) opted against venlafaxine  (c) considering pelvic rehabilitation   PLAN: [name] looks terrifically healthy and has a wonderful exercise program.  She has a normal life on a wonderful family.  She is very dutiful regarding her high risk screening and she will have her breast MRI this December and then her mammography in June.  She tells me her sister has had her fallopian tubes removed and her doctors in [place] suggested she take hormone replacement (she still has a uterus in place and also her breasts).  I am not familiar with that data but suggested if her sister can get it for [place] we can review it together.  We will continue to see [name] on a yearly basis in July.  Total encounter time 20 minutes.*   [name], Virgie Dad, MD  09/18/20 11:09 AM Medical Oncology and Hematology [facility] [place], [place] [address] Tel. [phone]    Fax. [phone]    I, Wilburn Mylar, am acting as  scribe for Dr. Virgie Dad. [name].  I, Lurline Del MD, have reviewed the above documentation for accuracy and completeness, and I agree with the above.   *Total Encounter Time as defined by the Centers for Medicare and Medicaid Services includes, in addition to the face-to-face time of a patient visit (documented in the note above) non-face-to-face time: obtaining and reviewing outside history, ordering and reviewing medications, tests or procedures, care coordination (communications with other health care professionals or caregivers) and documentation in the medical record.

## 2020-09-18 ENCOUNTER — Telehealth: Payer: Self-pay | Admitting: Oncology

## 2020-09-18 ENCOUNTER — Inpatient Hospital Stay: Payer: BC Managed Care – PPO | Attending: Oncology | Admitting: Oncology

## 2020-09-18 ENCOUNTER — Other Ambulatory Visit: Payer: Self-pay

## 2020-09-18 VITALS — BP 123/86 | HR 58 | Temp 97.7°F | Resp 16 | Ht 61.0 in | Wt 187.2 lb

## 2020-09-18 DIAGNOSIS — Z1509 Genetic susceptibility to other malignant neoplasm: Secondary | ICD-10-CM

## 2020-09-18 DIAGNOSIS — Z79899 Other long term (current) drug therapy: Secondary | ICD-10-CM | POA: Diagnosis not present

## 2020-09-18 DIAGNOSIS — Z1501 Genetic susceptibility to malignant neoplasm of breast: Secondary | ICD-10-CM | POA: Diagnosis not present

## 2020-09-18 DIAGNOSIS — Z1239 Encounter for other screening for malignant neoplasm of breast: Secondary | ICD-10-CM

## 2020-09-18 NOTE — Telephone Encounter (Signed)
Scheduled appointment per 07/07 los. Patient is aware. 

## 2020-11-07 DIAGNOSIS — I83813 Varicose veins of bilateral lower extremities with pain: Secondary | ICD-10-CM | POA: Diagnosis not present

## 2020-11-07 DIAGNOSIS — F410 Panic disorder [episodic paroxysmal anxiety] without agoraphobia: Secondary | ICD-10-CM | POA: Diagnosis not present

## 2020-11-07 DIAGNOSIS — Z0001 Encounter for general adult medical examination with abnormal findings: Secondary | ICD-10-CM | POA: Diagnosis not present

## 2020-11-07 DIAGNOSIS — E039 Hypothyroidism, unspecified: Secondary | ICD-10-CM | POA: Diagnosis not present

## 2020-11-07 DIAGNOSIS — Z1211 Encounter for screening for malignant neoplasm of colon: Secondary | ICD-10-CM | POA: Diagnosis not present

## 2020-11-07 DIAGNOSIS — E559 Vitamin D deficiency, unspecified: Secondary | ICD-10-CM | POA: Diagnosis not present

## 2020-11-07 DIAGNOSIS — Z0000 Encounter for general adult medical examination without abnormal findings: Secondary | ICD-10-CM | POA: Diagnosis not present

## 2020-11-07 DIAGNOSIS — M1712 Unilateral primary osteoarthritis, left knee: Secondary | ICD-10-CM | POA: Diagnosis not present

## 2020-11-07 DIAGNOSIS — E038 Other specified hypothyroidism: Secondary | ICD-10-CM | POA: Diagnosis not present

## 2020-11-07 DIAGNOSIS — Z13220 Encounter for screening for lipoid disorders: Secondary | ICD-10-CM | POA: Diagnosis not present

## 2020-12-29 DIAGNOSIS — I83813 Varicose veins of bilateral lower extremities with pain: Secondary | ICD-10-CM | POA: Diagnosis not present

## 2020-12-29 DIAGNOSIS — I8311 Varicose veins of right lower extremity with inflammation: Secondary | ICD-10-CM | POA: Diagnosis not present

## 2020-12-29 DIAGNOSIS — I8312 Varicose veins of left lower extremity with inflammation: Secondary | ICD-10-CM | POA: Diagnosis not present

## 2020-12-31 IMAGING — MR MR BREAST WO/W CM  BILAT
5 series · 30 of 48 positions shown · IV contrast (5 ml gadavist)
Comparison: January 19, 2019

CLINICAL DATA: Abbreviated Breast MRI for breast cancer screening.
6A3O9 positive patient.

LABS:  None
EXAM:
BILATERAL ABBREVIATED BREAST MRI WITH AND WITHOUT CONTRAST
TECHNIQUE: Multiplanar, multisequence MR images of both breasts were obtained
prior to and following the intravenous administration of 10 ml of
Gadavist

[Series 2: t2_tirm_tra ipat (a-p) · axial · 3.0mm · 0.70mm/px · z∈[-114,+63]mm · 5 of 60 slices shown]
[im 1/60]
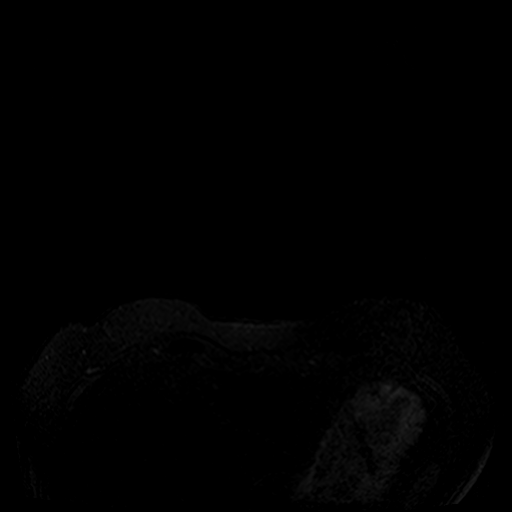
[im 15/60]
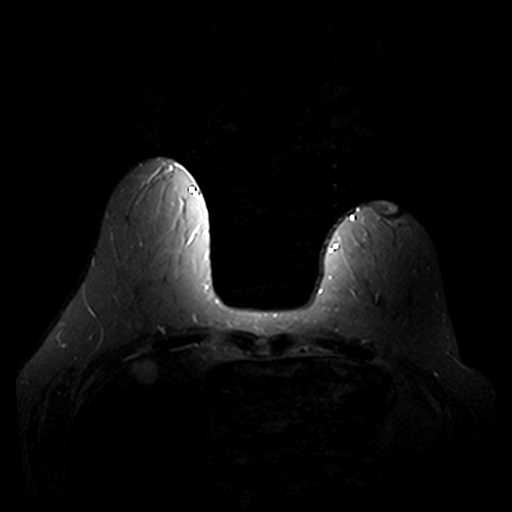
[im 30/60]
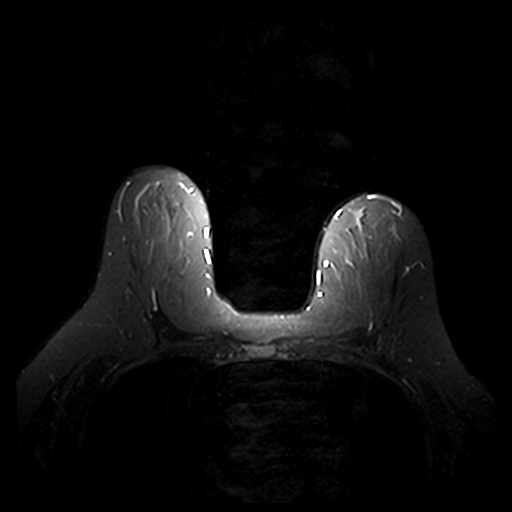
[im 45/60]
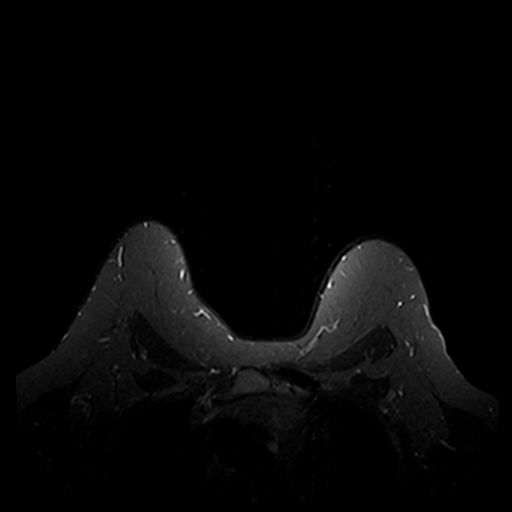
[im 60/60]
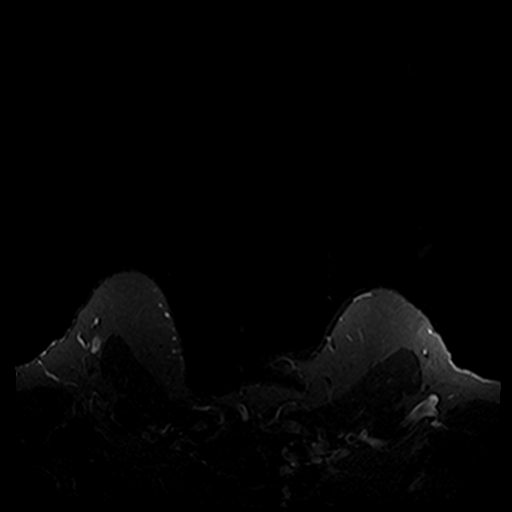

[Series 3: fl3d pre-cm · axial · non-contrast · 1.2mm · 0.94mm/px · z∈[-110,+81]mm · 8 of 160 slices shown]
[im 1/160]
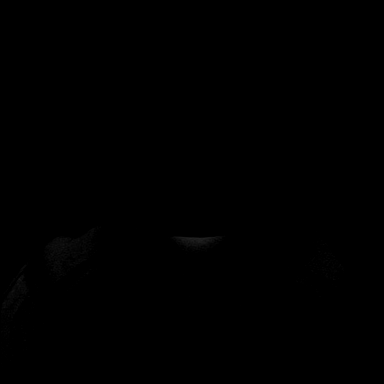
[im 25/160]
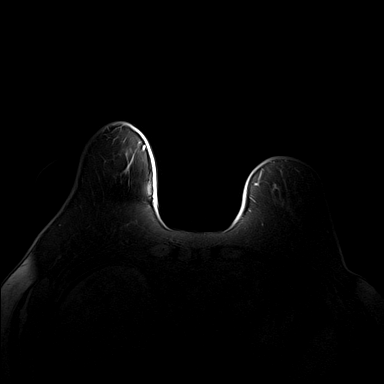
[im 49/160]
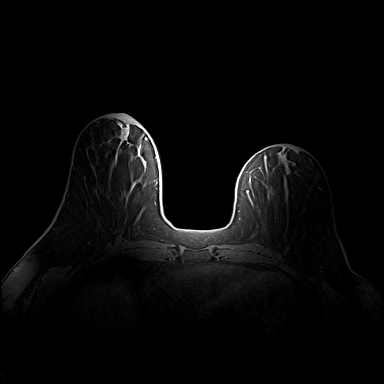
[im 74/160]
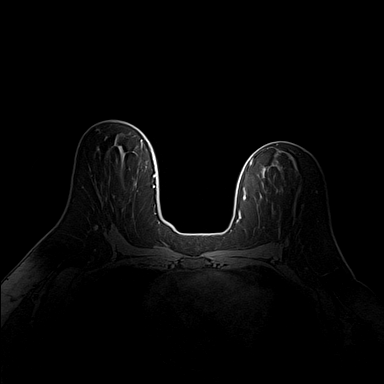
[im 86/160]
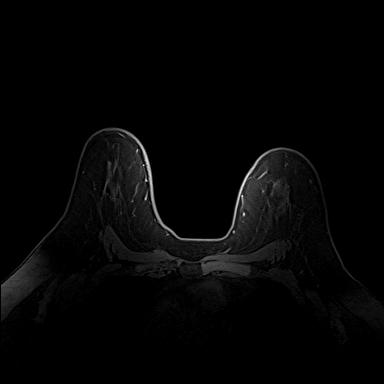
[im 111/160]
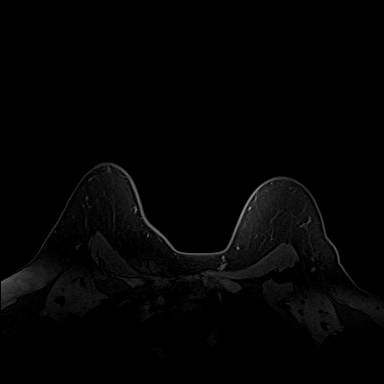
[im 135/160]
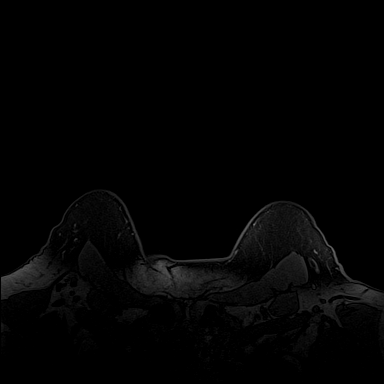
[im 160/160]
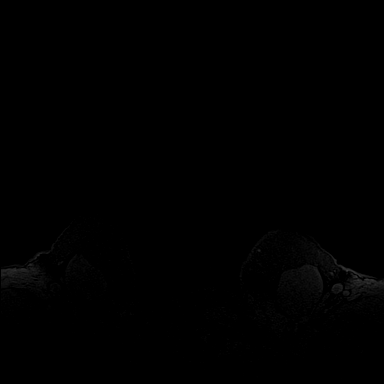

[Series 4: fl3d post-cm 20 · axial · 1.2mm · 0.94mm/px · z∈[-110,+81]mm · 8 of 160 slices shown (1 of 3)]
[im 1/160]
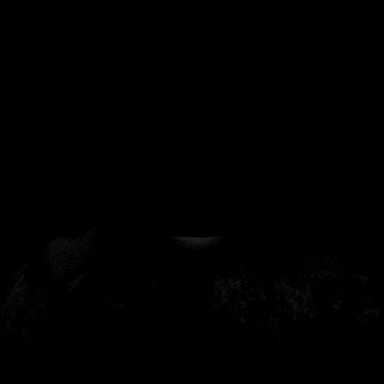
[im 25/160]
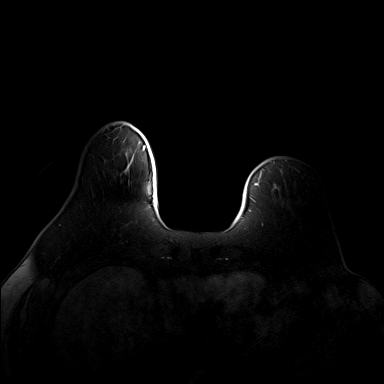
[im 49/160]
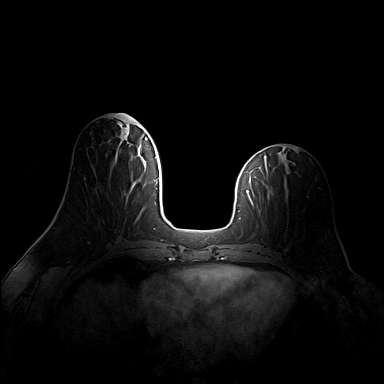
[im 74/160]
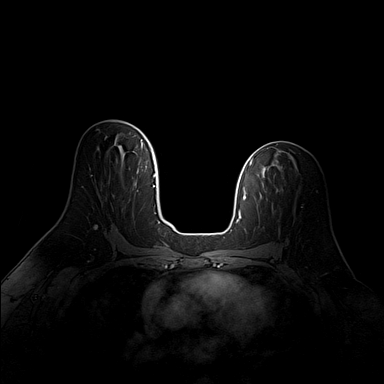
[im 86/160]
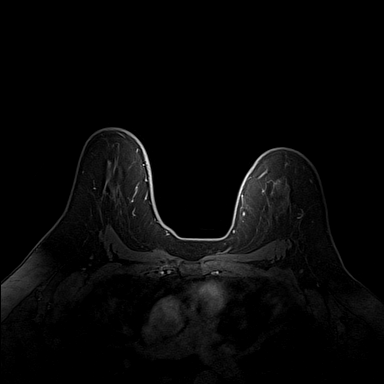
[im 111/160]
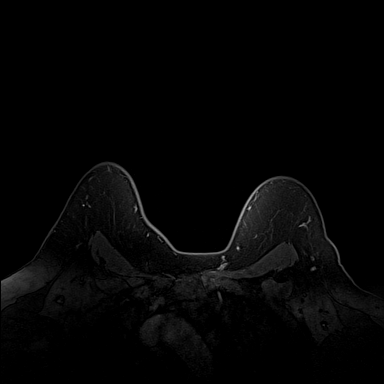
[im 135/160]
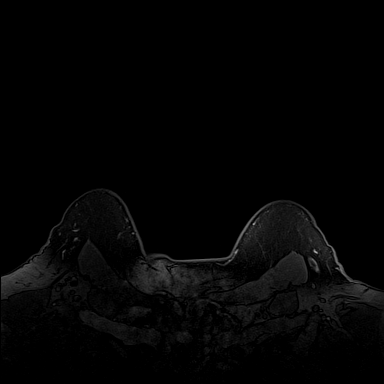
[im 160/160]
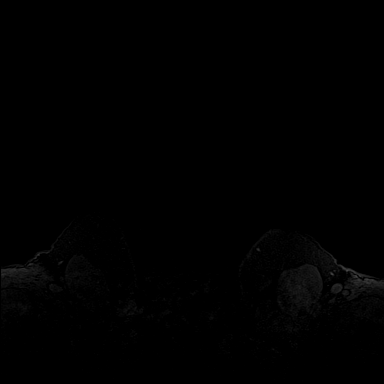

[Series 5: fl3d post-cm 20 · axial · 1.2mm · 0.94mm/px · z∈[-110,+81]mm · 8 of 160 slices shown (2 of 3)]
[im 1/160]
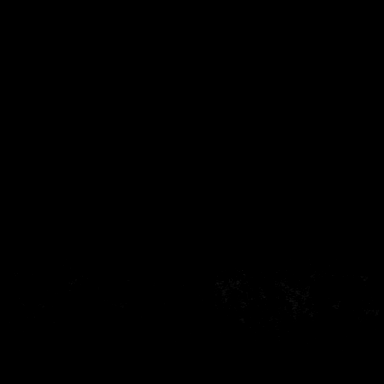
[im 25/160]
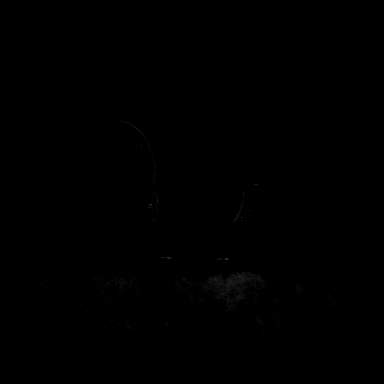
[im 49/160]
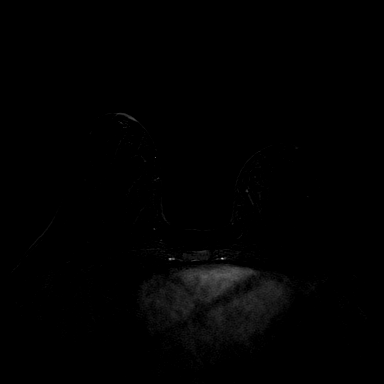
[im 74/160]
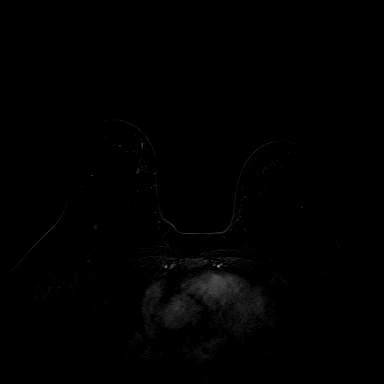
[im 86/160]
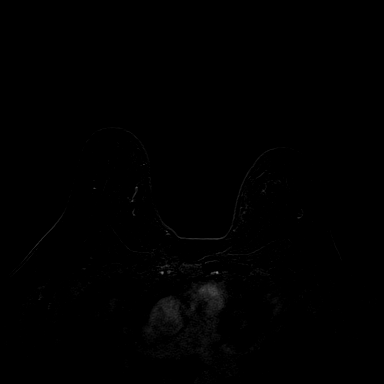
[im 111/160]
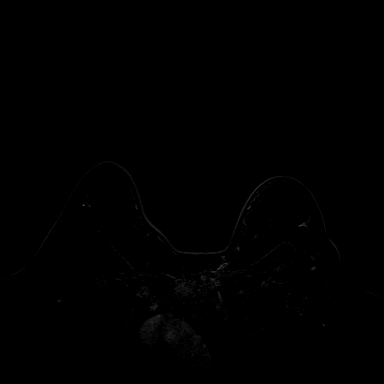
[im 135/160]
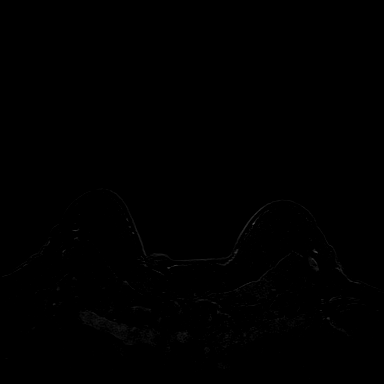
[im 160/160]
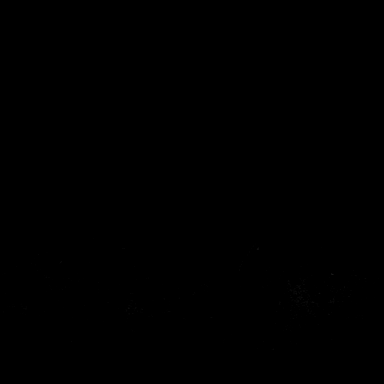

[Series 6: fl3d post-cm 20 · axial · 192.0mm · 0.94mm/px · 1 of 1 slices shown (3 of 3)]
[im 1/1]
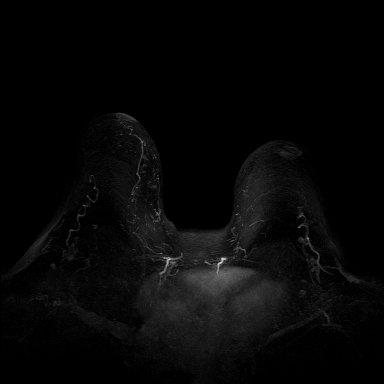

[30 of 48 positions shown; findings below may reference images not displayed]

Three-dimensional MR images were rendered by post-processing of the
original MR data on an independent workstation. The
three-dimensional MR images were interpreted, and findings are
reported in the following complete MRI report for this study. Three
dimensional images were evaluated at the independent [employer]
workstation
FINDINGS: Breast composition: b. Scattered fibroglandular tissue.

Background parenchymal enhancement: Mild

Right breast: No mass or abnormal enhancement.

Left breast: No mass or abnormal enhancement.

Lymph nodes: No abnormal appearing lymph nodes.

Ancillary findings: A left hepatic lobe cyst is again seen, of no
significance.
IMPRESSION: No MRI evidence of malignancy.

RECOMMENDATION:
Recommend annual screening mammography and annual screening breast
MRI. The patient was performed as an abbreviated breast MRI today.
Due to her 6A3O9 positive status, she should be performed as a
traditional screening breast MRI in the future.

BI-RADS CATEGORY  1: Negative.

## 2021-03-20 ENCOUNTER — Ambulatory Visit: Payer: BC Managed Care – PPO

## 2021-03-28 ENCOUNTER — Ambulatory Visit
Admission: RE | Admit: 2021-03-28 | Discharge: 2021-03-28 | Disposition: A | Discharge status: Home / Self Care | Payer: BC Managed Care – PPO | Source: Ambulatory Visit | Attending: Oncology | Admitting: Oncology

## 2021-03-28 ENCOUNTER — Other Ambulatory Visit: Payer: Self-pay

## 2021-03-28 DIAGNOSIS — Z1501 Genetic susceptibility to malignant neoplasm of breast: Secondary | ICD-10-CM

## 2021-03-28 DIAGNOSIS — Z1239 Encounter for other screening for malignant neoplasm of breast: Secondary | ICD-10-CM

## 2021-03-28 DIAGNOSIS — Z1509 Genetic susceptibility to other malignant neoplasm: Secondary | ICD-10-CM

## 2021-03-28 MED ORDER — GADOBUTROL 1 MMOL/ML IV SOLN
8.0000 mL | Freq: Once | INTRAVENOUS | Status: AC | PRN
  Administered 2021-03-28: 8 mL via INTRAVENOUS

## 2021-03-31 ENCOUNTER — Telehealth: Payer: Self-pay

## 2021-03-31 NOTE — Telephone Encounter (Signed)
-----   Message from Loa Socks, NP sent at 03/31/2021 10:29 AM EST ----- Please call patient and let her know that her breast MRI showed no cancer. ----- Message ----- From: Interface, Rad Results In Sent: 03/31/2021   8:09 AM EST To: Lowella Dell, MD

## 2021-03-31 NOTE — Telephone Encounter (Signed)
Per Mardella Layman, called pt and left VM that MRI shows no cancer.

## 2021-06-04 DIAGNOSIS — I8312 Varicose veins of left lower extremity with inflammation: Secondary | ICD-10-CM | POA: Diagnosis not present

## 2021-06-04 DIAGNOSIS — I83812 Varicose veins of left lower extremities with pain: Secondary | ICD-10-CM | POA: Diagnosis not present

## 2021-06-08 DIAGNOSIS — I8312 Varicose veins of left lower extremity with inflammation: Secondary | ICD-10-CM | POA: Diagnosis not present

## 2021-06-10 DIAGNOSIS — I8311 Varicose veins of right lower extremity with inflammation: Secondary | ICD-10-CM | POA: Diagnosis not present

## 2021-06-12 DIAGNOSIS — I8312 Varicose veins of left lower extremity with inflammation: Secondary | ICD-10-CM | POA: Diagnosis not present

## 2021-06-17 DIAGNOSIS — I8311 Varicose veins of right lower extremity with inflammation: Secondary | ICD-10-CM | POA: Diagnosis not present

## 2021-06-25 DIAGNOSIS — M65331 Trigger finger, right middle finger: Secondary | ICD-10-CM | POA: Diagnosis not present

## 2021-06-25 DIAGNOSIS — R52 Pain, unspecified: Secondary | ICD-10-CM | POA: Diagnosis not present

## 2021-06-25 DIAGNOSIS — G5603 Carpal tunnel syndrome, bilateral upper limbs: Secondary | ICD-10-CM | POA: Diagnosis not present

## 2021-06-25 DIAGNOSIS — M65332 Trigger finger, left middle finger: Secondary | ICD-10-CM | POA: Diagnosis not present

## 2021-06-26 DIAGNOSIS — I8312 Varicose veins of left lower extremity with inflammation: Secondary | ICD-10-CM | POA: Diagnosis not present

## 2021-06-26 DIAGNOSIS — M7981 Nontraumatic hematoma of soft tissue: Secondary | ICD-10-CM | POA: Diagnosis not present

## 2021-06-26 DIAGNOSIS — R52 Pain, unspecified: Secondary | ICD-10-CM | POA: Insufficient documentation

## 2021-07-01 DIAGNOSIS — I8311 Varicose veins of right lower extremity with inflammation: Secondary | ICD-10-CM | POA: Diagnosis not present

## 2021-07-08 DIAGNOSIS — M7981 Nontraumatic hematoma of soft tissue: Secondary | ICD-10-CM | POA: Diagnosis not present

## 2021-07-08 DIAGNOSIS — I8312 Varicose veins of left lower extremity with inflammation: Secondary | ICD-10-CM | POA: Diagnosis not present

## 2021-07-23 ENCOUNTER — Other Ambulatory Visit: Payer: Self-pay | Admitting: *Deleted

## 2021-07-23 DIAGNOSIS — Z1501 Genetic susceptibility to malignant neoplasm of breast: Secondary | ICD-10-CM

## 2021-07-23 DIAGNOSIS — Z4789 Encounter for other orthopedic aftercare: Secondary | ICD-10-CM | POA: Insufficient documentation

## 2021-07-23 DIAGNOSIS — M65332 Trigger finger, left middle finger: Secondary | ICD-10-CM | POA: Diagnosis not present

## 2021-07-23 DIAGNOSIS — Z1239 Encounter for other screening for malignant neoplasm of breast: Secondary | ICD-10-CM

## 2021-07-23 MED ORDER — GABAPENTIN 300 MG PO CAPS: 300.0000 mg | ORAL_CAPSULE | Freq: Every day | ORAL | 0 refills | Status: DC

## 2021-07-23 NOTE — Telephone Encounter (Signed)
Received faxed refill request for Gabapentin 300 mg at hs. Given for hot flashes due to surgical menopause - last OV note states will continue due to good response ?

## 2021-07-30 DIAGNOSIS — M79645 Pain in left finger(s): Secondary | ICD-10-CM | POA: Insufficient documentation

## 2021-08-05 ENCOUNTER — Telehealth: Payer: Self-pay | Admitting: Hematology and Oncology

## 2021-08-05 NOTE — Telephone Encounter (Signed)
Rescheduled appointment per providers. Patient aware.  ? ?

## 2021-08-06 DIAGNOSIS — M25642 Stiffness of left hand, not elsewhere classified: Secondary | ICD-10-CM | POA: Diagnosis not present

## 2021-08-06 DIAGNOSIS — L814 Other melanin hyperpigmentation: Secondary | ICD-10-CM | POA: Diagnosis not present

## 2021-08-06 DIAGNOSIS — L720 Epidermal cyst: Secondary | ICD-10-CM | POA: Diagnosis not present

## 2021-08-06 DIAGNOSIS — L578 Other skin changes due to chronic exposure to nonionizing radiation: Secondary | ICD-10-CM | POA: Diagnosis not present

## 2021-08-06 DIAGNOSIS — D229 Melanocytic nevi, unspecified: Secondary | ICD-10-CM | POA: Diagnosis not present

## 2021-08-19 DIAGNOSIS — E039 Hypothyroidism, unspecified: Secondary | ICD-10-CM | POA: Diagnosis not present

## 2021-08-19 DIAGNOSIS — Z124 Encounter for screening for malignant neoplasm of cervix: Secondary | ICD-10-CM | POA: Diagnosis not present

## 2021-08-19 DIAGNOSIS — Z6833 Body mass index (BMI) 33.0-33.9, adult: Secondary | ICD-10-CM | POA: Diagnosis not present

## 2021-08-19 DIAGNOSIS — E119 Type 2 diabetes mellitus without complications: Secondary | ICD-10-CM | POA: Diagnosis not present

## 2021-08-19 DIAGNOSIS — Z01419 Encounter for gynecological examination (general) (routine) without abnormal findings: Secondary | ICD-10-CM | POA: Diagnosis not present

## 2021-08-19 DIAGNOSIS — M7981 Nontraumatic hematoma of soft tissue: Secondary | ICD-10-CM | POA: Diagnosis not present

## 2021-08-20 DIAGNOSIS — Z1231 Encounter for screening mammogram for malignant neoplasm of breast: Secondary | ICD-10-CM | POA: Diagnosis not present

## 2021-08-21 DIAGNOSIS — E669 Obesity, unspecified: Secondary | ICD-10-CM | POA: Diagnosis not present

## 2021-08-21 DIAGNOSIS — R635 Abnormal weight gain: Secondary | ICD-10-CM | POA: Diagnosis not present

## 2021-09-29 ENCOUNTER — Ambulatory Visit: Payer: BC Managed Care – PPO | Admitting: Hematology and Oncology

## 2021-10-06 ENCOUNTER — Other Ambulatory Visit: Payer: Self-pay

## 2021-10-06 ENCOUNTER — Encounter: Payer: Self-pay | Admitting: Hematology and Oncology

## 2021-10-06 ENCOUNTER — Inpatient Hospital Stay: Payer: BC Managed Care – PPO | Attending: Hematology and Oncology | Admitting: Hematology and Oncology

## 2021-10-06 VITALS — BP 125/85 | HR 74 | Temp 97.7°F | Resp 17 | Ht 61.0 in | Wt 192.8 lb

## 2021-10-06 DIAGNOSIS — Z90722 Acquired absence of ovaries, bilateral: Secondary | ICD-10-CM | POA: Diagnosis not present

## 2021-10-06 DIAGNOSIS — Z1501 Genetic susceptibility to malignant neoplasm of breast: Secondary | ICD-10-CM | POA: Diagnosis not present

## 2021-10-06 DIAGNOSIS — Z9079 Acquired absence of other genital organ(s): Secondary | ICD-10-CM | POA: Diagnosis not present

## 2021-10-06 DIAGNOSIS — Z1239 Encounter for other screening for malignant neoplasm of breast: Secondary | ICD-10-CM | POA: Diagnosis not present

## 2021-10-06 DIAGNOSIS — Z9013 Acquired absence of bilateral breasts and nipples: Secondary | ICD-10-CM | POA: Insufficient documentation

## 2021-10-06 DIAGNOSIS — Z1509 Genetic susceptibility to other malignant neoplasm: Secondary | ICD-10-CM

## 2021-10-06 DIAGNOSIS — Z803 Family history of malignant neoplasm of breast: Secondary | ICD-10-CM | POA: Diagnosis not present

## 2021-10-06 NOTE — Progress Notes (Signed)
[facility]  [phone] [phone]    ID: [name] DOB: 06-03-1975  MR#: [id number]  WHQ#:[id number]  Patient Care Team: Brien Few, MD as PCP - General (Obstetrics and Gynecology) Magrinat, Virgie Dad, MD (Inactive) as Consulting Physician (Oncology) Irene Limbo, MD as Consulting Physician (Plastic Surgery) Ria Clock, MD as Attending Physician (Radiology) OTHER MD:   CHIEF COMPLAINT: BRCA1 positive  CURRENT TREATMENT: Intensified screening   INTERVAL HISTORY:  Jazzlin returns today for follow up of her BRCA1 mutation/breast cancer risk. Since her last visit, she underwent mammogram June 2023, negative for malignancy. She continues on intensified screening with MRIs and mammograms alternating.  She says she is not ready for bilateral mastectomy at this time.  She had salpingo-oophorectomy.  She continues to exercise regularly, walks about 5 to 6 miles daily, eats very healthy and is currently on Ozempic for weight loss. She denies any changes in her breast.  Rest of the pertinent 10 point ROS reviewed and negative   HISTORY OF CURRENT ILLNESS: From the original intake note:  [name] was recently diagnosed as BRCA 1 positive at c.213-11T>G on 04/05/2018 through the [facility].  She is here today to discuss management options.  For a full account of her family history and a pedigree please refer to the genetics counseling note dated 05/04/2018  The patient's subsequent history is as detailed below.   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;  Service: Gynecology;  Laterality: Right;  Requests 2 hrs.   wisdom teeth ext  teen    FAMILY HISTORY: Family History  Problem Relation Age of Onset   Diabetes Mother    Breast cancer Mother        dx 52 and 29, BRCA1+   Kidney disease Father    Heart disease Father    Hypertension Father    Cancer Maternal Grandmother        breast dx 71s, d. 9s   Breast cancer Paternal Aunt        dx 47s   Leukemia Cousin        dx childhood   Denicia's father died from heart disease and kidney failure at age 63. Patients' mother is 44 as of 05/2018, with a history of BRCA1+ and a diagnosis of breast cancer x2. The patient has 1 sister. Patient denies anyone in her family having ovarian, prostate, or pancreatic cancer. Adilyn's maternal grandmother was diagnosed with breast cancer. Chieko's maternal uncle and his daughter are also both BRCA1+.    GYNECOLOGIC HISTORY:  No LMP recorded. 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 Her husband, Elta Guadeloupe, works in Press photographer. They have three children, ages 73 (05/2005), 9 (07/2009), and 3 (06/2015).     ADVANCED DIRECTIVES: Her husband, Elta Guadeloupe, is her healthcare power of attorney in the absence of documents to the contrary.       HEALTH MAINTENANCE: Social History   Tobacco Use   Smoking status: Never   Smokeless tobacco: Never  Vaping Use   Vaping Use: Never used  Substance Use Topics   Alcohol use: Not Currently   Drug use: No    Colonoscopy: n/a  PAP: up to date  Bone density: n/a Mammography: [facility]. 06/2017.    No Known Allergies  Current Outpatient Medications  Medication Sig Dispense Refill   gabapentin (NEURONTIN) 300 MG capsule Take 1 capsule (300 mg total) by mouth at bedtime. 60 capsule 0   ibuprofen (ADVIL) 200 MG tablet Take 200 mg by mouth every 6 (six) hours as needed.     levothyroxine (SYNTHROID, LEVOTHROID) 100 MCG tablet Take 100 mcg by mouth daily before breakfast.      Multiple Vitamins-Minerals (AIRBORNE PO) Take by mouth as needed.     No current facility-administered medications for this visit.    OBJECTIVE: white woman who appears well  Vitals:   10/06/21 1318  BP: 125/85  Pulse: 74  Resp: 17  Temp: 97.7 F (36.5 C)  SpO2: 99%      Body mass index is 36.43 kg/m.   Wt Readings from Last 3 Encounters:  10/06/21 192 lb 12.8 oz (87.5 kg)  09/18/20 187 lb 3.2 oz (84.9 kg)  08/20/19 192 lb 14.4 oz (87.5 kg)      ECOG FS:0 - Asymptomatic  Sclerae unicteric, EOMs intact HEENT: Atraumatic, normocephalic, chest: Clear to auscultation bilaterally Heart: Rate and rhythm regular Abdomen soft, nontender, nondistended, no organomegaly Breasts: Bilateral breasts inspected.  No palpable masses or regional adenopathy Extremities: No lower extremity edema  LAB RESULTS:  CMP     Component Value Date/Time   NA 136 06/14/2014 1045   K 3.7 06/14/2014 1045   CL 106 06/14/2014 1045   CO2 22 06/14/2014 1045   GLUCOSE 116 (H) 06/14/2014 1045   BUN 7 06/14/2014 1045   CREATININE 0.66 06/14/2014 1045   CALCIUM 9.1 06/14/2014 1045   GFRNONAA >90 06/14/2014 1045   GFRAA >90 06/14/2014 1045    No results found for: "TOTALPROTELP", "ALBUMINELP", "A1GS", "A2GS", "BETS",  "BETA2SER", "GAMS", "MSPIKE", "SPEI"  No results found for: "KPAFRELGTCHN", "LAMBDASER", "KAPLAMBRATIO"  Lab Results  Component Value Date   WBC 7.5 10/26/2018   HGB 13.0 10/26/2018   HCT 40.5 10/26/2018   MCV 88.2 10/26/2018   PLT 222 10/26/2018   No results found for: "LABCA2"  No components found for: "GYIRSW546"  No results for input(s): "INR" in the last 168 hours.  No results found for: "LABCA2"  No results found for: "EVO350"  No results found for: "CAN125"  No results found for: "CAN153"  No results found for: "CA2729"  No components found for: "HGQUANT"  No results found for: "CEA1", "CEA" / No results found for: "CEA1", "CEA"   No results found for: "AFPTUMOR"  No results found for: "CHROMOGRNA"  No results found for: "HGBA", "HGBA2QUANT", "HGBFQUANT", "  HGBSQUAN" (Hemoglobinopathy evaluation)   No results found for: "LDH"  No results found for: "IRON", "TIBC", "IRONPCTSAT" (Iron and TIBC)  No results found for: "FERRITIN"  Urinalysis No results found for: "COLORURINE", "APPEARANCEUR", "LABSPEC", "PHURINE", "GLUCOSEU", "HGBUR", "BILIRUBINUR", "KETONESUR", "PROTEINUR", "UROBILINOGEN", "NITRITE", "LEUKOCYTESUR"   STUDIES:  No results found.   ELIGIBLE FOR AVAILABLE RESEARCH PROTOCOL: no   ASSESSMENT: 46 y.o. [place], [place] woman positive for a deleterious BRCA1 mutation, BRCA1 c.213-11T>G ([facility]).  (1) genetics testing 04/12/2018 through the multi-cancer panel offered by in [place] found no additional mutations in AIP, ALK, APC, ATM, AXIN2,BAP1,  BARD1, BLM, BMPR1A, BRCA1, BRCA2, BRIP1, CASR, CDC73, CDH1, CDK4, CDKN1B, CDKN1C, CDKN2A (p14ARF), CDKN2A (p16INK4a), CEBPA, CHEK2, CTNNA1, DICER1, DIS3L2, EGFR (c.2369C>T, p.Thr790Met variant only), EPCAM (Deletion/duplication testing only), FH, FLCN, GATA2, GPC3, GREM1 (Promoter region deletion/duplication testing only), HOXB13 (c.251G>A, p.Gly84Glu), HRAS, KIT, MAX, MEN1, MET, MITF (c.952G>A, p.Glu318Lys  variant only), MLH1, MSH2, MSH3, MSH6, MUTYH, NBN, NF1, NF2, NTHL1, PALB2, PDGFRA, PHOX2B, PMS2, POLD1, POLE, POT1, PRKAR1A, PTCH1, PTEN, RAD50, RAD51C, RAD51D, RB1, RECQL4, RET, RUNX1, SDHAF2, SDHA (sequence changes only), SDHB, SDHC, SDHD, SMAD4, SMARCA4, SMARCB1, SMARCE1, STK11, SUFU, TERC, TERT, TMEM127, TP53, TSC1, TSC2, VHL, WRN and WT1.  (2) risk reduction:   (a) status post left salpingectomy and right salpingooophorectomy 10/30/2018 with benign pathology   (i) patient had congenital absence of left ovary   (b) opted against anti- estrogens  (c) considering bilateral mastectomies  (3) intensified screening:  (a) mammography  breast MRI December and biannual MD breast exam  (4) menopausal symptoms  (a) on gabapentin po Qhs  (b) opted against venlafaxine  (c) considering pelvic rehabilitation   PLAN: Ms. Yesika is here for follow-up.  She has BRCA1 gene and hence undergoes intensified screening.  Her last mammography is unremarkable.  She will be due for MRI in December 2023. Physical examination today without any concerning changes. We have once again discussed about role of bilateral mastectomy versus intensified screening.  I Encouraged her regular exercise schedule, healthy diet and weight loss regimen. She will return to clinic in 1 year or sooner as needed.  She alternates between OB/GYN exam as well as [place] every 6 months.  Total encounter time 30 minutes.*  *Total Encounter Time as defined by the Centers for Medicare and Medicaid Services includes, in addition to the face-to-face time of a patient visit (documented in the note above) non-face-to-face time: obtaining and reviewing outside history, ordering and reviewing medications, tests or procedures, care coordination (communications with other health care professionals or caregivers) and documentation in the medical record.

## 2021-10-14 DIAGNOSIS — I8311 Varicose veins of right lower extremity with inflammation: Secondary | ICD-10-CM | POA: Diagnosis not present

## 2021-10-14 DIAGNOSIS — I8312 Varicose veins of left lower extremity with inflammation: Secondary | ICD-10-CM | POA: Diagnosis not present

## 2021-11-09 ENCOUNTER — Ambulatory Visit (INDEPENDENT_AMBULATORY_CARE_PROVIDER_SITE_OTHER): Payer: BC Managed Care – PPO | Admitting: Gastroenterology

## 2021-11-09 ENCOUNTER — Encounter: Payer: Self-pay | Admitting: Gastroenterology

## 2021-11-09 VITALS — BP 110/70 | HR 68 | Ht 62.0 in | Wt 190.0 lb

## 2021-11-09 DIAGNOSIS — Z1501 Genetic susceptibility to malignant neoplasm of breast: Secondary | ICD-10-CM

## 2021-11-09 DIAGNOSIS — Z1509 Genetic susceptibility to other malignant neoplasm: Secondary | ICD-10-CM | POA: Diagnosis not present

## 2021-11-09 DIAGNOSIS — K625 Hemorrhage of anus and rectum: Secondary | ICD-10-CM

## 2021-11-09 DIAGNOSIS — K582 Mixed irritable bowel syndrome: Secondary | ICD-10-CM

## 2021-11-09 NOTE — Progress Notes (Signed)
Chief Complaint:   Referring Provider:  Brien Few, MD      ASSESSMENT AND PLAN;   #1. Rectal bleeding/  #2. BRCA1 +  #3. IBS with alt diarrhea and constipation  #4. FH of colon polyps (mom at ag e 61)  Plan: -Colon with MiraLAX -Hold MOUNJARO x 1 week before.   Discussed risks & benefits of colonoscopy. Risks including rare perforation req laparotomy, bleeding after bx/polypectomy req blood transfusion, rarely missing neoplasms, risks of anesthesia/sedation, rare risk of damage to internal organs. Benefits outweigh the risks. Patient agrees to proceed. All the questions were answered. Pt consents to proceed. HPI:    [name] is a 46 y.o. female with BRCA1 +, anxiety, mild obesity, hypothyroidism, history of gestational diabetes  Here for intermittent rectal bleeding, mostly bright red, mostly away from the stool. She does have alternating diarrhea and constipation with mild abdominal bloating.  No nocturnal symptoms.  When she travels or goes to the beach, she is constipated.  After eating out, she would have occasional diarrhea.  No unintentional weight loss.  No melena  She denies having any upper GI symptoms including N/V, heartburn, regurgitation, odynophagia or dysphagia.  She did have some nausea when she was on Ozempic.  Colon 8-9 yrs ago ?2014 by Dr Collene Mares- s/p hemorrhoidal banding by [facility]  She gets regular physical exams from her PCP.  Normal CBC recently.   No FH of colon cancer.  Strong family history of breast cancer.  No family history of pancreatic cancer. 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  Past Medical History:  Diagnosis Date   BRCA1 gene mutation positive    dx 01/ 2020   Family history of breast cancer    Hemorrhoids    History of gestational diabetes     Hypothyroidism    followed by dr Ronita Hipps    Past Surgical History:  Procedure Laterality Date   CESAREAN SECTION  2007 & 2010   x 2 - at [place] Bilateral 06/15/2014   Procedure: REPEAT CESAREAN SECTION WITH BILATERAL TUBAL LIGATION;  Surgeon: Brien Few, MD;  Location: [facility];  Service: Obstetrics;  Laterality: Bilateral;  EDD: 06/19/14   COLONOSCOPY WITH PROPOFOL  08/31/2012   ROBOTIC ASSISTED SALPINGO OOPHERECTOMY Right 10/30/2018   Procedure: XI ROBOTIC ASSISTED SALPINGO OOPHORECTOMY/With Lysis of Adhesions;  Surgeon: Brien Few, MD;  Location: [facility];  Service: Gynecology;  Laterality: Right;  Requests 2 hrs.   TRIGGER FINGER RELEASE Left    wisdom teeth ext  teen    Family History  Problem Relation Age of Onset   Diabetes Mother    Breast cancer Mother        dx 46 and 44, BRCA1+   Colon polyps Mother    Fibromyalgia Mother    Kidney disease Father    Heart disease Father    Hypertension Father    Diabetes Sister    Cancer Maternal Grandmother        breast dx 59s, d. 92s   Breast cancer Paternal Aunt        dx 71s   Leukemia Cousin        dx childhood    Social History   Tobacco  Use   Smoking status: Never   Smokeless tobacco: Never  Vaping Use   Vaping Use: Never used  Substance Use Topics   Alcohol use: Not Currently   Drug use: No    Current Outpatient Medications  Medication Sig Dispense Refill   ALPRAZolam (XANAX) 0.5 MG tablet Take 1 tablet by mouth as needed.     Estradiol 10 MCG TABS vaginal tablet Place 10 mcg vaginally 2 (two) times a week.     ibuprofen (ADVIL) 200 MG tablet Take 200 mg by mouth every 6 (six) hours as needed.     levothyroxine (SYNTHROID) 112 MCG tablet Take 112 mcg by mouth daily.     MOUNJARO 2.5 MG/0.5ML Pen Inject 2.5 mg into the skin once a week.     Multiple Vitamins-Minerals (AIRBORNE PO) Take by mouth as needed.     tretinoin (RETIN-A) 0.025 % cream  Apply 1 Application topically daily.     gabapentin (NEURONTIN) 300 MG capsule Take 1 capsule (300 mg total) by mouth at bedtime. (Patient not taking: Reported on 11/09/2021) 60 capsule 0   No current facility-administered medications for this visit.    No Known Allergies  Review of Systems:  Constitutional: Denies fever, chills, diaphoresis, appetite change and fatigue.  HEENT: Denies photophobia, eye pain, redness, hearing loss, ear pain, congestion, sore throat, rhinorrhea, sneezing, mouth sores, neck pain, neck stiffness and tinnitus.   Respiratory: Denies SOB, DOE, cough, chest tightness,  and wheezing.   Cardiovascular: Denies chest pain, palpitations and leg swelling.  Genitourinary: Denies dysuria, urgency, frequency, hematuria, flank pain and difficulty urinating.  Musculoskeletal: Denies myalgias, back pain, joint swelling, arthralgias and gait problem.  Skin: No rash.  Neurological: Denies dizziness, seizures, syncope, weakness, light-headedness, numbness and headaches.  Hematological: Denies adenopathy. Easy bruising, personal or family bleeding history  Psychiatric/Behavioral: Has anxiety, no depression     Physical Exam:    BP 110/70 (BP Location: Left Arm, Patient Position: Sitting, Cuff Size: Normal)   Pulse 68   Ht 5' 2"  (1.575 m) Comment: height measured without shoes  Wt 190 lb (86.2 kg)   LMP 10/30/2018   Breastfeeding No   BMI 34.75 kg/m  Wt Readings from Last 3 Encounters:  11/09/21 190 lb (86.2 kg)  10/06/21 192 lb 12.8 oz (87.5 kg)  09/18/20 187 lb 3.2 oz (84.9 kg)   Constitutional:  Well-developed, in no acute distress. Psychiatric: Normal mood and affect. Behavior is normal. HEENT: Pupils normal.  Conjunctivae are normal. No scleral icterus. Cardiovascular: Normal rate, regular rhythm. No edema Pulmonary/chest: Effort normal and breath sounds normal. No wheezing, rales or rhonchi. Abdominal: Soft, nondistended. Nontender. Bowel sounds active  throughout. There are no masses palpable. No hepatomegaly. Rectal: Deferred Neurological: Alert and oriented to person place and time. Skin: Skin is warm and dry. No rashes noted.  Data Reviewed: I have personally reviewed following labs and imaging studies  CBC:    Latest Ref Rng & Units 10/26/2018   10:17 AM 06/16/2014    6:28 AM 06/14/2014   10:45 AM  CBC  WBC 4.0 - 10.5 K/uL 7.5  10.1  10.8   Hemoglobin 12.0 - 15.0 g/dL 13.0  12.3  13.9   Hematocrit 36.0 - 46.0 % 40.5  36.2  40.0   Platelets 150 - 400 K/uL 222  124  146     CMP:    Latest Ref Rng & Units 06/14/2014   10:45 AM 07/29/2008   11:23 AM  CMP  Glucose 70 -  99 mg/dL 116  63   BUN 6 - 23 mg/dL 7  7   Creatinine 0.50 - 1.10 mg/dL 0.66  0.66   Sodium 135 - 145 mmol/L 136  137   Potassium 3.5 - 5.1 mmol/L 3.7  4.0   Chloride 96 - 112 mmol/L 106  107   CO2 19 - 32 mmol/L 22  24   Calcium 8.4 - 10.5 mg/dL 9.1  9.1         Carmell Austria, MD 11/09/2021, 10:58 AM  Cc: Brien Few, MD

## 2021-11-09 NOTE — Patient Instructions (Addendum)
_______________________________________________________  If you are age 44 or older, your body mass index should be between 23-30. Your Body mass index is 34.75 kg/m. If this is out of the aforementioned range listed, please consider follow up with your Primary Care Provider.  If you are age 68 or younger, your body mass index should be between 19-25. Your Body mass index is 34.75 kg/m. If this is out of the aformentioned range listed, please consider follow up with your Primary Care Provider.   ________________________________________________________  The [facility] providers would like to encourage you to use [facility] to communicate with providers for non-urgent requests or questions.  Due to long hold times on the telephone, sending your provider a message by [facility] may be a faster and more efficient way to get a response.  Please allow 48 business hours for a response.  Please remember that this is for non-urgent requests.  _______________________________________________________  [name] have been scheduled for a colonoscopy. Please follow written instructions given to you at your visit today.  Please pick up your prep supplies at the pharmacy within the next 1-3 days. If you use inhalers (even only as needed), please bring them with you on the day of your procedure.  Hold GLP-1 x 1 week before procedure  Thank you,  Dr. [name]

## 2021-11-24 ENCOUNTER — Other Ambulatory Visit: Payer: Self-pay

## 2021-11-24 DIAGNOSIS — Z8249 Family history of ischemic heart disease and other diseases of the circulatory system: Secondary | ICD-10-CM

## 2021-11-27 DIAGNOSIS — E669 Obesity, unspecified: Secondary | ICD-10-CM | POA: Diagnosis not present

## 2021-11-27 DIAGNOSIS — F413 Other mixed anxiety disorders: Secondary | ICD-10-CM | POA: Diagnosis not present

## 2021-11-27 DIAGNOSIS — R635 Abnormal weight gain: Secondary | ICD-10-CM | POA: Diagnosis not present

## 2021-12-01 ENCOUNTER — Encounter: Payer: Self-pay | Admitting: Certified Registered Nurse Anesthetist

## 2021-12-02 ENCOUNTER — Encounter: Payer: Self-pay | Admitting: Gastroenterology

## 2021-12-09 ENCOUNTER — Ambulatory Visit (AMBULATORY_SURGERY_CENTER): Payer: BC Managed Care – PPO | Admitting: Gastroenterology

## 2021-12-09 ENCOUNTER — Encounter: Payer: Self-pay | Admitting: Gastroenterology

## 2021-12-09 VITALS — BP 109/66 | HR 57 | Temp 97.5°F | Resp 12 | Ht 62.0 in | Wt 190.0 lb

## 2021-12-09 DIAGNOSIS — K625 Hemorrhage of anus and rectum: Secondary | ICD-10-CM

## 2021-12-09 DIAGNOSIS — K641 Second degree hemorrhoids: Secondary | ICD-10-CM

## 2021-12-09 DIAGNOSIS — R197 Diarrhea, unspecified: Secondary | ICD-10-CM | POA: Diagnosis not present

## 2021-12-09 MED ORDER — SODIUM CHLORIDE 0.9 % IV SOLN: 500.0000 mL | Freq: Once | INTRAVENOUS | Status: DC

## 2021-12-09 MED ORDER — HYDROCORTISONE (PERIANAL) 2.5 % EX CREA: 1.0000 | TOPICAL_CREAM | Freq: Two times a day (BID) | CUTANEOUS | 2 refills | Status: DC

## 2021-12-09 NOTE — Progress Notes (Signed)
Chief Complaint:   Referring Provider:  Brien Few, MD      ASSESSMENT AND PLAN;   #1. Rectal bleeding/  #2. BRCA1 +  #3. IBS with alt diarrhea and constipation  #4. FH of colon polyps (mom at ag e 58)  Plan: -Colon with MiraLAX -Hold MOUNJARO x 1 week before.   Discussed risks & benefits of colonoscopy. Risks including rare perforation req laparotomy, bleeding after bx/polypectomy req blood transfusion, rarely missing neoplasms, risks of anesthesia/sedation, rare risk of damage to internal organs. Benefits outweigh the risks. Patient agrees to proceed. All the questions were answered. Pt consents to proceed. HPI:    [name] is a 46 y.o. female with BRCA1 +, anxiety, mild obesity, hypothyroidism, history of gestational diabetes  Here for intermittent rectal bleeding, mostly bright red, mostly away from the stool. She does have alternating diarrhea and constipation with mild abdominal bloating.  No nocturnal symptoms.  When she travels or goes to the beach, she is constipated.  After eating out, she would have occasional diarrhea.  No unintentional weight loss.  No melena  She denies having any upper GI symptoms including N/V, heartburn, regurgitation, odynophagia or dysphagia.  She did have some nausea when she was on Ozempic.  Colon 8-9 yrs ago ?2014 by Dr Collene Mares- s/p hemorrhoidal banding by [facility]  She gets regular physical exams from her PCP.  Normal CBC recently.   No FH of colon cancer.  Strong family history of breast cancer.  No family history of pancreatic cancer. 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  Past Medical History:  Diagnosis Date   BRCA1 gene mutation positive    dx 01/ 2020   Family history of breast cancer    Hemorrhoids    History of gestational diabetes     Hypothyroidism    followed by dr Ronita Hipps    Past Surgical History:  Procedure Laterality Date   CESAREAN SECTION  2007 & 2010   x 2 - at [facility] Bilateral 06/15/2014   Procedure: REPEAT CESAREAN SECTION WITH BILATERAL TUBAL LIGATION;  Surgeon: Brien Few, MD;  Location: [facility];  Service: Obstetrics;  Laterality: Bilateral;  EDD: 06/19/14   COLONOSCOPY WITH PROPOFOL  08/31/2012   ROBOTIC ASSISTED SALPINGO OOPHERECTOMY Right 10/30/2018   Procedure: XI ROBOTIC ASSISTED SALPINGO OOPHORECTOMY/With Lysis of Adhesions;  Surgeon: Brien Few, MD;  Location: [facility];  Service: Gynecology;  Laterality: Right;  Requests 2 hrs.   TRIGGER FINGER RELEASE Left    wisdom teeth ext  teen    Family History  Problem Relation Age of Onset   Diabetes Mother    Breast cancer Mother        dx 29 and 64, BRCA1+   Colon polyps Mother    Fibromyalgia Mother    Kidney disease Father    Heart disease Father    Hypertension Father    Diabetes Sister    Cancer Maternal Grandmother        breast dx 77s, d. 49s   Breast cancer Paternal Aunt        dx 55s   Leukemia Cousin        dx childhood    Social History   Tobacco Use  Smoking status: Never   Smokeless tobacco: Never  Vaping Use   Vaping Use: Never used  Substance Use Topics   Alcohol use: Not Currently   Drug use: No    Current Outpatient Medications  Medication Sig Dispense Refill   ALPRAZolam (XANAX) 0.5 MG tablet Take 1 tablet by mouth as needed.     Estradiol 10 MCG TABS vaginal tablet Place 10 mcg vaginally 2 (two) times a week.     ibuprofen (ADVIL) 200 MG tablet Take 200 mg by mouth every 6 (six) hours as needed.     levothyroxine (SYNTHROID) 112 MCG tablet Take 112 mcg by mouth daily.     Multiple Vitamins-Minerals (AIRBORNE PO) Take by mouth as needed.     tretinoin (RETIN-A) 0.025 % cream Apply 1 Application topically daily.     gabapentin (NEURONTIN) 300 MG  capsule Take 1 capsule (300 mg total) by mouth at bedtime. (Patient not taking: Reported on 11/09/2021) 60 capsule 0   MOUNJARO 2.5 MG/0.5ML Pen Inject 2.5 mg into the skin once a week.     Current Facility-Administered Medications  Medication Dose Route Frequency Provider Last Rate Last Admin   0.9 %  sodium chloride infusion  500 mL Intravenous Once Jackquline Denmark, MD        No Known Allergies  Review of Systems:  Constitutional: Denies fever, chills, diaphoresis, appetite change and fatigue.  HEENT: Denies photophobia, eye pain, redness, hearing loss, ear pain, congestion, sore throat, rhinorrhea, sneezing, mouth sores, neck pain, neck stiffness and tinnitus.   Respiratory: Denies SOB, DOE, cough, chest tightness,  and wheezing.   Cardiovascular: Denies chest pain, palpitations and leg swelling.  Genitourinary: Denies dysuria, urgency, frequency, hematuria, flank pain and difficulty urinating.  Musculoskeletal: Denies myalgias, back pain, joint swelling, arthralgias and gait problem.  Skin: No rash.  Neurological: Denies dizziness, seizures, syncope, weakness, light-headedness, numbness and headaches.  Hematological: Denies adenopathy. Easy bruising, personal or family bleeding history  Psychiatric/Behavioral: Has anxiety, no depression     Physical Exam:    BP 137/77   Pulse 70   Temp (!) 97.5 F (36.4 C) (Skin)   Resp 13   Ht 5' 2"  (1.575 m)   Wt 190 lb (86.2 kg)   LMP 10/30/2018   SpO2 100%   BMI 34.75 kg/m  Wt Readings from Last 3 Encounters:  12/09/21 190 lb (86.2 kg)  11/09/21 190 lb (86.2 kg)  10/06/21 192 lb 12.8 oz (87.5 kg)   Constitutional:  Well-developed, in no acute distress. Psychiatric: Normal mood and affect. Behavior is normal. HEENT: Pupils normal.  Conjunctivae are normal. No scleral icterus. Cardiovascular: Normal rate, regular rhythm. No edema Pulmonary/chest: Effort normal and breath sounds normal. No wheezing, rales or rhonchi. Abdominal: Soft,  nondistended. Nontender. Bowel sounds active throughout. There are no masses palpable. No hepatomegaly. Rectal: Deferred Neurological: Alert and oriented to person place and time. Skin: Skin is warm and dry. No rashes noted.  Data Reviewed: I have personally reviewed following labs and imaging studies  CBC:    Latest Ref Rng & Units 10/26/2018   10:17 AM 06/16/2014    6:28 AM 06/14/2014   10:45 AM  CBC  WBC 4.0 - 10.5 K/uL 7.5  10.1  10.8   Hemoglobin 12.0 - 15.0 g/dL 13.0  12.3  13.9   Hematocrit 36.0 - 46.0 % 40.5  36.2  40.0   Platelets 150 - 400 K/uL 222  124  146     CMP:  Latest Ref Rng & Units 06/14/2014   10:45 AM 07/29/2008   11:23 AM  CMP  Glucose 70 - 99 mg/dL 116  63   BUN 6 - 23 mg/dL 7  7   Creatinine 0.50 - 1.10 mg/dL 0.66  0.66   Sodium 135 - 145 mmol/L 136  137   Potassium 3.5 - 5.1 mmol/L 3.7  4.0   Chloride 96 - 112 mmol/L 106  107   CO2 19 - 32 mmol/L 22  24   Calcium 8.4 - 10.5 mg/dL 9.1  9.1         Carmell Austria, MD 12/09/2021, 2:06 PM  Cc: Brien Few, MD

## 2021-12-09 NOTE — Progress Notes (Signed)
Report given to PACU, vss 

## 2021-12-09 NOTE — Op Note (Signed)
[facility] Patient Name: [name] Procedure Date: 12/09/2021 2:05 PM MRN: [id number] Endoscopist: [name] , MD Age: 46 Referring MD:  Date of Birth: 18-Mar-1975 Gender: Female Account #: [id number] Procedure:                Colonoscopy Indications:              Rectal bleeding. Intermittent diarrhea. FH of colon                            polyps (mom at age 34) Medicines:                Monitored Anesthesia Care Procedure:                Pre-Anesthesia Assessment:                           - Prior to the procedure, a History and Physical                            was performed, and patient medications and                            allergies were reviewed. The patient's tolerance of                            previous anesthesia was also reviewed. The risks                            and benefits of the procedure and the sedation                            options and risks were discussed with the patient.                            All questions were answered, and informed consent                            was obtained. Prior Anticoagulants: The patient has                            taken no previous anticoagulant or antiplatelet                            agents. ASA Grade Assessment: II - A patient with                            mild systemic disease. After reviewing the risks                            and benefits, the patient was deemed in                            satisfactory condition to undergo the procedure.  After obtaining informed consent, the colonoscope                            was passed under direct vision. Throughout the                            procedure, the patient's blood pressure, pulse, and                            oxygen saturations were monitored continuously. The                            PCF-HQ190L Colonoscope was introduced through the                            anus and advanced to the 4 cm into  the ileum. The                            colonoscopy was performed without difficulty. The                            patient tolerated the procedure well. The quality                            of the bowel preparation was good. The terminal                            ileum, ileocecal valve, appendiceal orifice, and                            rectum were photographed. Scope In: 2:08:52 PM Scope Out: 2:32:03 PM Scope Withdrawal Time: 0 hours 19 minutes 13 seconds  Total Procedure Duration: 0 hours 23 minutes 11 seconds  Findings:                 The colon (entire examined portion) appeared                            normal. Biopsies for histology were taken with a                            cold forceps from the entire colon for evaluation                            of microscopic colitis.                           The terminal ileum contained a few localized                            non-bleeding erosions and a superficial 1 cm ulcer                            (?Importance). No stigmata of recent bleeding were  seen. Biopsies were taken with a cold forceps for                            histology.                           Non-bleeding internal hemorrhoids were found during                            retroflexion and during perianal exam. The                            hemorrhoids were moderate and Grade II (internal                            hemorrhoids that prolapse but reduce                            spontaneously). There were red wale markings                            suggestive of recent bleed. No active bleeding.                           The exam was otherwise without abnormality on                            direct and retroflexion views. Complications:            No immediate complications. Estimated Blood Loss:     Estimated blood loss: none. Impression:               - The entire examined colon is normal. Biopsied.                           -  A few erosions in the terminal ileum. Biopsied                            (r/o Crohn's).                           - Non-bleeding internal hemorrhoids.                           - The examination was otherwise normal on direct                            and retroflexion views. Recommendation:           - Patient has a contact number available for                            emergencies. The signs and symptoms of potential                            delayed complications were discussed with the  patient. Return to normal activities tomorrow.                            Written discharge instructions were provided to the                            patient.                           - Resume previous high-fiber diet.                           - Continue present medications.                           - Follow biopsies.                           - HC Cream 2.5%: Apply externally BID for 10 days.                            2RF                           - The findings and recommendations were discussed                            with the patient's family. If still with rectal                            bleeding, proceed with hemorrhoidal banding with                            Dr. Barron Alvine. [name], MD 12/09/2021 2:43:16 PM This report has been signed electronically.

## 2021-12-09 NOTE — Progress Notes (Signed)
Pt's states no medical or surgical changes since previsit or office visit. 

## 2021-12-09 NOTE — Patient Instructions (Signed)
Discharge instructions given. Handout on Hemorrhoids. Prescription sent to pharmacy. Resume previous medications YOU HAD AN ENDOSCOPIC PROCEDURE TODAY AT [facility]:   Refer to the procedure report that was given to you for any specific questions about what was found during the examination.  If the procedure report does not answer your questions, please call your gastroenterologist to clarify.  If you requested that your care partner not be given the details of your procedure findings, then the procedure report has been included in a sealed envelope for you to review at your convenience later.  YOU SHOULD EXPECT: Some feelings of bloating in the abdomen. Passage of more gas than usual.  Walking can help get rid of the air that was put into your GI tract during the procedure and reduce the bloating. If you had a lower endoscopy (such as a colonoscopy or flexible sigmoidoscopy) you may notice spotting of blood in your stool or on the toilet paper. If you underwent a bowel prep for your procedure, you may not have a normal bowel movement for a few days.  Please Note:  You might notice some irritation and congestion in your nose or some drainage.  This is from the oxygen used during your procedure.  There is no need for concern and it should clear up in a day or so.  SYMPTOMS TO REPORT IMMEDIATELY:  Following lower endoscopy (colonoscopy or flexible sigmoidoscopy):  Excessive amounts of blood in the stool  Significant tenderness or worsening of abdominal pains  Swelling of the abdomen that is new, acute  Fever of 100F or higher   For urgent or emergent issues, a gastroenterologist can be reached at any hour by calling [phone]. Do not use MyChart messaging for urgent concerns.    DIET:  We do recommend a small meal at first, but then you may proceed to your regular diet.  Drink plenty of fluids but you should avoid alcoholic beverages for 24 hours.  ACTIVITY:  You should  plan to take it easy for the rest of today and you should NOT DRIVE or use heavy machinery until tomorrow (because of the sedation medicines used during the test).    FOLLOW UP: Our staff will call the number listed on your records the next business day following your procedure.  We will call around 7:15- 8:00 am to check on you and address any questions or concerns that you may have regarding the information given to you following your procedure. If we do not reach you, we will leave a message.     If any biopsies were taken you will be contacted by phone or by letter within the next 1-3 weeks.  Please call us at [phone] if you have not heard about the biopsies in 3 weeks.    SIGNATURES/CONFIDENTIALITY: You and/or your care partner have signed paperwork which will be entered into your electronic medical record.  These signatures attest to the fact that that the information above on your After Visit Summary has been reviewed and is understood.  Full responsibility of the confidentiality of this discharge information lies with you and/or your care-partner.

## 2021-12-09 NOTE — Progress Notes (Signed)
Called to room to assist during endoscopic procedure.  Patient ID and intended procedure confirmed with present staff. Received instructions for my participation in the procedure from the performing physician.  

## 2021-12-10 ENCOUNTER — Telehealth: Payer: Self-pay

## 2021-12-10 NOTE — Telephone Encounter (Signed)
  Follow up Call-     12/09/2021    1:16 PM  Call back number  Post procedure Call Back phone  # [phone]  Permission to leave phone message Yes     Patient questions:  Do you have a fever, pain , or abdominal swelling? No. Pain Score  0 *  Have you tolerated food without any problems? Yes.    Have you been able to return to your normal activities? Yes.    Do you have any questions about your discharge instructions: Diet   No. Medications  No. Follow up visit  No.  Do you have questions or concerns about your Care? No.  Actions: * If pain score is 4 or above: No action needed, pain <4.

## 2021-12-26 ENCOUNTER — Encounter: Payer: Self-pay | Admitting: Gastroenterology

## 2022-01-08 ENCOUNTER — Other Ambulatory Visit (HOSPITAL_BASED_OUTPATIENT_CLINIC_OR_DEPARTMENT_OTHER): Payer: BC Managed Care – PPO

## 2022-01-13 DIAGNOSIS — I83813 Varicose veins of bilateral lower extremities with pain: Secondary | ICD-10-CM | POA: Diagnosis not present

## 2022-01-18 ENCOUNTER — Encounter: Payer: Self-pay | Admitting: Hematology and Oncology

## 2022-01-19 ENCOUNTER — Other Ambulatory Visit: Payer: Self-pay | Admitting: *Deleted

## 2022-01-19 DIAGNOSIS — E038 Other specified hypothyroidism: Secondary | ICD-10-CM | POA: Diagnosis not present

## 2022-01-19 DIAGNOSIS — R7309 Other abnormal glucose: Secondary | ICD-10-CM | POA: Diagnosis not present

## 2022-01-19 DIAGNOSIS — Z1239 Encounter for other screening for malignant neoplasm of breast: Secondary | ICD-10-CM

## 2022-01-19 DIAGNOSIS — Z13220 Encounter for screening for lipoid disorders: Secondary | ICD-10-CM | POA: Diagnosis not present

## 2022-01-19 DIAGNOSIS — Z1501 Genetic susceptibility to malignant neoplasm of breast: Secondary | ICD-10-CM

## 2022-01-19 DIAGNOSIS — Z803 Family history of malignant neoplasm of breast: Secondary | ICD-10-CM

## 2022-01-19 DIAGNOSIS — Z0000 Encounter for general adult medical examination without abnormal findings: Secondary | ICD-10-CM | POA: Diagnosis not present

## 2022-01-19 LAB — HEMOGLOBIN A1C: Hemoglobin A1C: 5.3

## 2022-01-19 LAB — LIPID PANEL
Cholesterol: 251 — AB (ref 0–200)
HDL: 82 — AB (ref 35–70)
LDL Cholesterol: 158
Triglycerides: 66 (ref 40–160)

## 2022-01-19 LAB — BASIC METABOLIC PANEL
BUN: 10 (ref 4–21)
CO2: 23 — AB (ref 13–22)
Chloride: 102 (ref 99–108)
Creatinine: 1 (ref 0.5–1.1)
Glucose: 81
Potassium: 5 mEq/L (ref 3.5–5.1)
Sodium: 139 (ref 137–147)

## 2022-01-19 LAB — CBC AND DIFFERENTIAL
HCT: 43 (ref 36–46)
Hemoglobin: 14 (ref 12.0–16.0)
Neutrophils Absolute: 3.1
Platelets: 225 10*3/uL (ref 150–400)
WBC: 5

## 2022-01-19 LAB — COMPREHENSIVE METABOLIC PANEL
Albumin: 4.9 (ref 3.5–5.0)
Calcium: 10 (ref 8.7–10.7)
Globulin: 2.3
eGFR: 72

## 2022-01-19 LAB — TSH: TSH: 0.58 (ref 0.41–5.90)

## 2022-01-19 LAB — HEPATIC FUNCTION PANEL
ALT: 18 U/L (ref 7–35)
AST: 29 (ref 13–35)
Alkaline Phosphatase: 94 (ref 25–125)
Bilirubin, Total: 0.6

## 2022-01-19 LAB — TESTOSTERONE: Testosterone: 2.48

## 2022-01-19 LAB — CBC: RBC: 4.69 (ref 3.87–5.11)

## 2022-02-10 ENCOUNTER — Ambulatory Visit (HOSPITAL_BASED_OUTPATIENT_CLINIC_OR_DEPARTMENT_OTHER)
Admission: RE | Admit: 2022-02-10 | Discharge: 2022-02-10 | Disposition: A | Discharge status: Home / Self Care | Payer: BC Managed Care – PPO | Source: Ambulatory Visit | Attending: Cardiology | Admitting: Cardiology

## 2022-02-10 DIAGNOSIS — Z8249 Family history of ischemic heart disease and other diseases of the circulatory system: Secondary | ICD-10-CM | POA: Insufficient documentation

## 2022-03-19 ENCOUNTER — Ambulatory Visit
Admission: RE | Admit: 2022-03-19 | Discharge: 2022-03-19 | Disposition: A | Discharge status: Home / Self Care | Payer: BC Managed Care – PPO | Source: Ambulatory Visit | Attending: Hematology and Oncology | Admitting: Hematology and Oncology

## 2022-03-19 DIAGNOSIS — Z1239 Encounter for other screening for malignant neoplasm of breast: Secondary | ICD-10-CM

## 2022-03-19 DIAGNOSIS — Z1509 Genetic susceptibility to other malignant neoplasm: Secondary | ICD-10-CM

## 2022-03-19 DIAGNOSIS — Z1501 Genetic susceptibility to malignant neoplasm of breast: Secondary | ICD-10-CM

## 2022-03-19 DIAGNOSIS — Z803 Family history of malignant neoplasm of breast: Secondary | ICD-10-CM

## 2022-03-19 DIAGNOSIS — N6489 Other specified disorders of breast: Secondary | ICD-10-CM | POA: Diagnosis not present

## 2022-03-19 MED ORDER — GADOPICLENOL 0.5 MMOL/ML IV SOLN
7.0000 mL | Freq: Once | INTRAVENOUS | Status: AC | PRN
  Administered 2022-03-19: 7 mL via INTRAVENOUS

## 2022-04-02 ENCOUNTER — Ambulatory Visit (INDEPENDENT_AMBULATORY_CARE_PROVIDER_SITE_OTHER): Payer: BC Managed Care – PPO | Admitting: Physician Assistant

## 2022-04-02 ENCOUNTER — Encounter: Payer: Self-pay | Admitting: Physician Assistant

## 2022-04-02 VITALS — BP 130/84 | HR 82 | Temp 97.5°F | Ht 62.0 in | Wt 165.4 lb

## 2022-04-02 DIAGNOSIS — E782 Mixed hyperlipidemia: Secondary | ICD-10-CM

## 2022-04-02 DIAGNOSIS — Z8632 Personal history of gestational diabetes: Secondary | ICD-10-CM | POA: Insufficient documentation

## 2022-04-02 DIAGNOSIS — Z8659 Personal history of other mental and behavioral disorders: Secondary | ICD-10-CM | POA: Insufficient documentation

## 2022-04-02 DIAGNOSIS — E039 Hypothyroidism, unspecified: Secondary | ICD-10-CM

## 2022-04-02 DIAGNOSIS — R931 Abnormal findings on diagnostic imaging of heart and coronary circulation: Secondary | ICD-10-CM | POA: Diagnosis not present

## 2022-04-02 HISTORY — DX: Mixed hyperlipidemia: E78.2

## 2022-04-02 MED ORDER — TIRZEPATIDE 10 MG/0.5ML ~~LOC~~ SOAJ: 10.0000 mg | SUBCUTANEOUS | 2 refills | Status: DC

## 2022-04-02 NOTE — Progress Notes (Signed)
Subjective:    Patient ID: [name], female    DOB: 05-25-1975, 47 y.o.   MRN: [id number]  Chief Complaint  Patient presents with   New Patient (Initial Visit)    NP in office to establish care with PCP; has had recent labs in Nov and brought with her to abstract in chart; last saw PCP at [facility] in [place]; no concerns to discuss; just started a statin medication 1 month ago; sees Onocolgy as well;     HPI 47 y.o. patient presents today for new patient establishment with me.  Patient was previously established with [facility] - Rowan Blase, PA-C.  Current Care Team: Oncology - BRCA1 positive  GYN - Dr. Ronita Hipps   Acute Concerns: She is needing Mounjaro refilled.  Chronic Concerns: See PMH listed below, as well as A/P for details on issues we specifically discussed during today's visit.      Past Medical History:  Diagnosis Date   Anxiety 2011   BRCA1 gene mutation positive    dx 01/ 2020   Family history of breast cancer    Gestational diabetes mellitus, class A2 06/17/2014   Gestational thrombocytopenia ([place]) 06/17/2014   Hemorrhoids    History of gestational diabetes    Hypothyroidism    followed by dr Ronita Hipps   Mixed hyperlipidemia 04/02/2022    Past Surgical History:  Procedure Laterality Date   CESAREAN SECTION  2007 & 2010   x 2 - at [place] Bilateral 06/15/2014   Procedure: REPEAT CESAREAN SECTION WITH BILATERAL TUBAL LIGATION;  Surgeon: Brien Few, MD;  Location: [facility];  Service: Obstetrics;  Laterality: Bilateral;  EDD: 06/19/14   COLONOSCOPY WITH PROPOFOL  08/31/2012   ROBOTIC ASSISTED SALPINGO OOPHERECTOMY Right 10/30/2018   Procedure: XI ROBOTIC ASSISTED SALPINGO OOPHORECTOMY/With Lysis of Adhesions;  Surgeon: Brien Few, MD;  Location: [facility];  Service: Gynecology;  Laterality: Right;  Requests 2 hrs.   TRIGGER FINGER RELEASE Left    TUBAL LIGATION  2016   wisdom teeth ext   teen    Family History  Problem Relation Age of Onset   Diabetes Mother    Breast cancer Mother        dx 10 and 61, BRCA1+   Colon polyps Mother    Fibromyalgia Mother    Cancer Mother    Obesity Mother    Varicose Veins Mother    Kidney disease Father    Heart disease Father    Hypertension Father    Alcohol abuse Father    Obesity Father    Stroke Father    Diabetes Sister    Obesity Sister    Cancer Maternal Grandmother        breast dx 44s, d. 57s   Breast cancer Paternal Aunt        dx 39s   Leukemia Cousin        dx childhood    Social History   Tobacco Use   Smoking status: Never   Smokeless tobacco: Never  Vaping Use   Vaping Use: Never used  Substance Use Topics   Alcohol use: Not Currently   Drug use: No     No Known Allergies  Review of Systems NEGATIVE UNLESS OTHERWISE INDICATED IN HPI      Objective:     BP 130/84 (BP Location: Left Arm)   Pulse 82   Temp (!) 97.5 F (36.4 C) (Temporal)   Ht 5'  2" (1.575 m)   Wt 165 lb 6.4 oz (75 kg)   SpO2 97%   BMI 30.25 kg/m   Wt Readings from Last 3 Encounters:  04/02/22 165 lb 6.4 oz (75 kg)  12/09/21 190 lb (86.2 kg)  11/09/21 190 lb (86.2 kg)    BP Readings from Last 3 Encounters:  04/02/22 130/84  12/09/21 109/66  11/09/21 110/70     Physical Exam Vitals and nursing note reviewed.  Constitutional:      Appearance: Normal appearance. She is normal weight. She is not toxic-appearing.  HENT:     Head: Normocephalic and atraumatic.     Right Ear: Tympanic membrane, ear canal and external ear normal.     Left Ear: Tympanic membrane, ear canal and external ear normal.     Nose: Nose normal.     Mouth/Throat:     Mouth: Mucous membranes are moist.  Eyes:     Extraocular Movements: Extraocular movements intact.     Conjunctiva/sclera: Conjunctivae normal.     Pupils: Pupils are equal, round, and reactive to light.  Cardiovascular:     Rate and Rhythm: Normal rate and regular  rhythm.     Pulses: Normal pulses.     Heart sounds: Normal heart sounds.  Pulmonary:     Effort: Pulmonary effort is normal.     Breath sounds: Normal breath sounds.  Musculoskeletal:     Cervical back: Normal range of motion and neck supple.  Skin:    General: Skin is warm and dry.  Neurological:     General: No focal deficit present.     Mental Status: She is alert and oriented to person, place, and time.  Psychiatric:        Mood and Affect: Mood normal.        Behavior: Behavior normal.        Thought Content: Thought content normal.        Judgment: Judgment normal.        Assessment & Plan:  History of gestational diabetes Assessment & Plan: Patient has been doing well with Mounjaro.  She is currently at 10 mg weekly dose.  Refilled this medication for her.  Plan to follow-up with her in about 3 months.   Hypothyroidism, unspecified type Assessment & Plan: Labs have been stable.  I personally reviewed her most recent blood work with her as she had a copy with her.  She will continue on levothyroxine 112 mcg.   High coronary artery calcium score Assessment & Plan: Calcium score of 93.  She was recently started on Crestor 5 mg.  She is doing well with this medication and no side effects so far.   Mixed hyperlipidemia Assessment & Plan: Recently started on Crestor 5 mg due to high coronary artery calcium score.   History of panic attacks Assessment & Plan: Currently stable.  Rarely takes alprazolam 0.5 mg. PDMP reviewed today, no red flags, filling appropriately.    Other orders -     Tirzepatide; Inject 10 mg into the skin once a week.  Dispense: 6 mL; Refill: 2        Return in about 3 months (around 07/02/2022) for recheck, fasting lipid panel and thyroid recheck .  This note was prepared with assistance of Systems analyst. Occasional wrong-word or sound-a-like substitutions may have occurred due to the inherent limitations of voice  recognition software.     Jeyren Danowski M Radek Carnero, PA-C

## 2022-04-02 NOTE — Patient Instructions (Signed)
Welcome to [facility]! It was a pleasure meeting you today.  As discussed, Please schedule a 3 month follow up visit today. Fasting lipid panel and thyroid recheck that day.  Call sooner if any concerns.  Keep up great work!!!   PLEASE NOTE:  If you had any LAB tests please let us know if you have not heard back within a few days. You may see your results on MyChart before we have a chance to review them but we will give you a call once they are reviewed by [place]. If we ordered any REFERRALS today, please let us know if you have not heard from their office within the next two weeks. Let us know through MyChart if you are needing REFILLS, or have your pharmacy send [place] the request. You can also use MyChart to communicate with me or any office staff.  Please try these tips to maintain a healthy lifestyle:  Eat most of your calories during the day when you are active. Eliminate processed foods including packaged sweets (pies, cakes, cookies), reduce intake of potatoes, white bread, white pasta, and white rice. Look for whole grain options, oat flour or almond flour.  Each meal should contain half fruits/vegetables, one quarter protein, and one quarter carbs (no bigger than a computer mouse).  Cut down on sweet beverages. This includes juice, soda, and sweet tea. Also watch fruit intake, though this is a healthier sweet option, it still contains natural sugar! Limit to 3 servings daily.  Drink at least 1 glass of water with each meal and aim for at least 8 glasses (64 ounces) per day.  Exercise at least 150 minutes every week to the best of your ability.    Take Care,  Stephenson Cichy, PA-C

## 2022-04-10 NOTE — Assessment & Plan Note (Signed)
Calcium score of 93.  She was recently started on Crestor 5 mg.  She is doing well with this medication and no side effects so far.

## 2022-04-10 NOTE — Assessment & Plan Note (Signed)
Patient has been doing well with Mounjaro.  She is currently at 10 mg weekly dose.  Refilled this medication for her.  Plan to follow-up with her in about 3 months.

## 2022-04-10 NOTE — Assessment & Plan Note (Signed)
Labs have been stable.  I personally reviewed her most recent blood work with her as she had a copy with her.  She will continue on levothyroxine 112 mcg.

## 2022-04-10 NOTE — Assessment & Plan Note (Signed)
Recently started on Crestor 5 mg due to high coronary artery calcium score.

## 2022-04-10 NOTE — Assessment & Plan Note (Signed)
Currently stable.  Rarely takes alprazolam 0.5 mg. PDMP reviewed today, no red flags, filling appropriately.

## 2022-05-24 ENCOUNTER — Encounter: Payer: Self-pay | Admitting: Physician Assistant

## 2022-06-14 ENCOUNTER — Encounter: Payer: Self-pay | Admitting: Physician Assistant

## 2022-06-14 NOTE — Telephone Encounter (Signed)
Please see pt msg and advise provider advise

## 2022-06-21 NOTE — Telephone Encounter (Signed)
Please see pt response and advise 

## 2022-06-24 ENCOUNTER — Other Ambulatory Visit: Payer: Self-pay | Admitting: Physician Assistant

## 2022-06-24 MED ORDER — MOUNJARO 12.5 MG/0.5ML ~~LOC~~ SOAJ: 12.5000 mg | SUBCUTANEOUS | 2 refills | Status: DC

## 2022-07-05 NOTE — Telephone Encounter (Signed)
Can someone please look into this patients PA and advise, thanks

## 2022-07-07 ENCOUNTER — Other Ambulatory Visit (HOSPITAL_COMMUNITY): Payer: Self-pay

## 2022-07-08 ENCOUNTER — Other Ambulatory Visit: Payer: Self-pay | Admitting: Physician Assistant

## 2022-07-08 MED ORDER — ZEPBOUND 12.5 MG/0.5ML ~~LOC~~ SOAJ: 12.5000 mg | SUBCUTANEOUS | 2 refills | Status: DC

## 2022-07-08 NOTE — Telephone Encounter (Signed)
Please message from PCP regarding new prior auth

## 2022-07-08 NOTE — Telephone Encounter (Signed)
Please see msg from patient and Rx Prior [employer] team and advise

## 2022-07-09 ENCOUNTER — Ambulatory Visit: Payer: BC Managed Care – PPO | Admitting: Physician Assistant

## 2022-07-12 ENCOUNTER — Ambulatory Visit (INDEPENDENT_AMBULATORY_CARE_PROVIDER_SITE_OTHER): Payer: BC Managed Care – PPO | Admitting: Physician Assistant

## 2022-07-12 ENCOUNTER — Telehealth: Payer: Self-pay

## 2022-07-12 ENCOUNTER — Encounter: Payer: Self-pay | Admitting: Physician Assistant

## 2022-07-12 VITALS — BP 124/84 | HR 79 | Temp 97.3°F | Ht 62.0 in | Wt 153.6 lb

## 2022-07-12 DIAGNOSIS — R931 Abnormal findings on diagnostic imaging of heart and coronary circulation: Secondary | ICD-10-CM

## 2022-07-12 DIAGNOSIS — E663 Overweight: Secondary | ICD-10-CM | POA: Diagnosis not present

## 2022-07-12 DIAGNOSIS — Z7689 Persons encountering health services in other specified circumstances: Secondary | ICD-10-CM | POA: Insufficient documentation

## 2022-07-12 DIAGNOSIS — E782 Mixed hyperlipidemia: Secondary | ICD-10-CM | POA: Diagnosis not present

## 2022-07-12 DIAGNOSIS — E039 Hypothyroidism, unspecified: Secondary | ICD-10-CM

## 2022-07-12 DIAGNOSIS — Z6828 Body mass index (BMI) 28.0-28.9, adult: Secondary | ICD-10-CM

## 2022-07-12 HISTORY — DX: Persons encountering health services in other specified circumstances: Z76.89

## 2022-07-12 LAB — LIPID PANEL
Cholesterol: 148 mg/dL (ref 0–200)
HDL: 65.7 mg/dL (ref >39.00)
LDL Cholesterol: 71 mg/dL (ref 0–99)
NonHDL: 82.56
Total CHOL/HDL Ratio: 2
Triglycerides: 56 mg/dL (ref 0.0–149.0)
VLDL: 11.2 mg/dL (ref 0.0–40.0)

## 2022-07-12 LAB — TSH: TSH: 0.14 u[IU]/mL — ABNORMAL LOW (ref 0.35–5.50)

## 2022-07-12 NOTE — Progress Notes (Addendum)
Subjective:    Patient ID: [name], female    DOB: [birth year], 47 y.o.   MRN: [id number]  Chief Complaint  Patient presents with   Medical Management of Chronic Issues    Pt in office for 3 mon f/u and fasting lipids and recheck thyroid; pt asking about prior authorization and double checking on status;     HPI Patient is in today for 3 month f/up and weight check-in.  She has really changed her health habits in the last 3 years. Starting weight was 230 lbs; last year when started on Mounjaro, was at 192 lbs. Today at 153 lbs. Goal is 10 lb further loss.  Weight-lifting several times weekly.  Mainly walking 5-6 miles per day, 7 days per week Low-carb, high-protein diet rich in fruits and vegetables daily.   Feeling very good overall.   Past Medical History:  Diagnosis Date   Anxiety 2011   BRCA1 gene mutation positive    dx 01/ 2020   Encounter for weight management 07/12/2022   Family history of breast cancer    Gestational diabetes mellitus, class A2 06/17/2014   Gestational thrombocytopenia (HCC) 06/17/2014   Hemorrhoids    History of gestational diabetes    Hypothyroidism    followed by dr Billy Coast   Mixed hyperlipidemia 04/02/2022    Past Surgical History:  Procedure Laterality Date   CESAREAN SECTION  2007 & 2010   x 2 - at [facility]   CESAREAN SECTION WITH BILATERAL TUBAL LIGATION Bilateral 06/15/2014   Procedure: REPEAT CESAREAN SECTION WITH BILATERAL TUBAL LIGATION;  Surgeon: Olivia Mackie, MD;  Location: [facility];  Service: Obstetrics;  Laterality: Bilateral;  EDD: 06/19/14   COLONOSCOPY WITH PROPOFOL  08/31/2012   ROBOTIC ASSISTED SALPINGO OOPHERECTOMY Right 10/30/2018   Procedure: XI ROBOTIC ASSISTED SALPINGO OOPHORECTOMY/With Lysis of Adhesions;  Surgeon: Olivia Mackie, MD;  Location: [facility];  Service: Gynecology;  Laterality: Right;  Requests 2 hrs.   TRIGGER FINGER RELEASE Left    TUBAL LIGATION  2016   wisdom teeth ext  teen     Family History  Problem Relation Age of Onset   Diabetes Mother    Breast cancer Mother        dx 31 and 27, BRCA1+   Colon polyps Mother    Fibromyalgia Mother    Cancer Mother    Obesity Mother    Varicose Veins Mother    Kidney disease Father    Heart disease Father    Hypertension Father    Alcohol abuse Father    Obesity Father    Stroke Father    Diabetes Sister    Obesity Sister    Cancer Maternal Grandmother        breast dx 88s, d. 97s   Breast cancer Paternal Aunt        dx 65s   Leukemia Cousin        dx childhood    Social History   Tobacco Use   Smoking status: Never   Smokeless tobacco: Never  Vaping Use   Vaping Use: Never used  Substance Use Topics   Alcohol use: Not Currently   Drug use: No     No Known Allergies  Review of Systems NEGATIVE UNLESS OTHERWISE INDICATED IN HPI      Objective:     BP 124/84 (BP Location: Left Arm)   Pulse 79   Temp (!) 97.3 F (36.3 C) (Temporal)   Ht 5\' 2"  (1.575  m)   Wt 153 lb 9.6 oz (69.7 kg)   SpO2 98%   BMI 28.09 kg/m   Wt Readings from Last 3 Encounters:  07/12/22 153 lb 9.6 oz (69.7 kg)  04/02/22 165 lb 6.4 oz (75 kg)  12/09/21 190 lb (86.2 kg)    BP Readings from Last 3 Encounters:  07/12/22 124/84  04/02/22 130/84  12/09/21 109/66     Physical Exam Vitals and nursing note reviewed.  Constitutional:      Appearance: Normal appearance. She is obese.  Cardiovascular:     Rate and Rhythm: Normal rate and regular rhythm.  Pulmonary:     Effort: Pulmonary effort is normal.     Breath sounds: Normal breath sounds.  Skin:    General: Skin is warm.  Neurological:     General: No focal deficit present.     Mental Status: She is alert and oriented to person, place, and time.  Psychiatric:        Mood and Affect: Mood normal.        Behavior: Behavior normal.        Assessment & Plan:  Encounter for weight management Assessment & Plan: She is currently walking 5-6 miles  daily; well-balanced low-carb, high protein diet. See HPI - starting wt was 230 lb three years ago. With [facility] start in June 2023, has lost another 40 lb in last year.  Currently 153 lb with BMI 28.09, with HLD comorbidity. Trying to get Zepbound 12.5 mg once weekly approved at this time.    Overweight with body mass index (BMI) of 28 to 28.9 in adult  Hypothyroidism, unspecified type Assessment & Plan: Recheck labs today. She will continue on levothyroxine 112 mcg pending normal /stable.   Orders: -     TSH  High coronary artery calcium score Assessment & Plan: Calcium score of 93.  She was recently started on Crestor 5 mg.  She is doing well with this medication and no side effects so far. Recheck lipid panel today.    Mixed hyperlipidemia Assessment & Plan: Recently started on Crestor 5 mg due to high coronary artery calcium score. Recheck lipid panel. Doing well, no side effects.  Orders: -     Lipid panel    PRIOR BMI HISTORY:   08/20/2019 09/18/2020 10/06/2021 11/09/2021 12/09/2021 04/02/2022  BASIC VITALS        BMI (Calculated) 36.47  35.39  36.45  34.74  34.74  30.24     07/12/2022  BASIC VITALS   BMI (Calculated) 28.09       Return in about 3 months (around 10/11/2022) for recheck/follow-up.    Briea Mcenery M Ivanka Kirshner, PA-C

## 2022-07-12 NOTE — Assessment & Plan Note (Signed)
Calcium score of 93.  She was recently started on Crestor 5 mg.  She is doing well with this medication and no side effects so far. Recheck lipid panel today.

## 2022-07-12 NOTE — Assessment & Plan Note (Signed)
She is currently walking 5-6 miles daily; well-balanced low-carb, high protein diet. See HPI - starting wt was 230 lb three years ago. With [facility] start in June 2023, has lost another 40 lb in last year.  Currently 153 lb with BMI 28.09, with HLD comorbidity. Trying to get Zepbound 12.5 mg once weekly approved at this time.

## 2022-07-12 NOTE — Assessment & Plan Note (Signed)
Recently started on Crestor 5 mg due to high coronary artery calcium score. Recheck lipid panel. Doing well, no side effects.

## 2022-07-12 NOTE — Assessment & Plan Note (Signed)
Recheck labs today. She will continue on levothyroxine 112 mcg pending normal /stable.

## 2022-07-12 NOTE — Telephone Encounter (Signed)
[name] (Key: [id number]) PA Case ID #: [id number] Rx #: [id number]  Sent to Plan today  Drug Zepbound 12.5MG /0.5ML pen-injectors+  Awaiting determination

## 2022-07-13 ENCOUNTER — Other Ambulatory Visit: Payer: Self-pay

## 2022-07-13 ENCOUNTER — Other Ambulatory Visit: Payer: Self-pay | Admitting: Physician Assistant

## 2022-07-13 DIAGNOSIS — R7989 Other specified abnormal findings of blood chemistry: Secondary | ICD-10-CM

## 2022-07-13 MED ORDER — LEVOTHYROXINE SODIUM 100 MCG PO TABS: 100.0000 ug | ORAL_TABLET | Freq: Every day | ORAL | 0 refills | Status: DC

## 2022-07-13 NOTE — Telephone Encounter (Signed)
[name] ([name]) PA Case ID #: [id number] Outcome Denied on April 29 Your PA request has been denied. Additional information will be provided in the denial communication. (Message 1140) Drug Zepbound 12.5MG /0.5ML pen-injectors  Submitted to [facility] with requested documentation of BMI. Received confirmation of fax sent to [facility]. Patient notified via MyChart

## 2022-07-16 NOTE — Telephone Encounter (Signed)
Per Hasna unable to print BMI flow sheet with patient identifiers. Can you please copy flow chart for BMI and addend last OV note pasting in the note so this can be printed for PA, thanks

## 2022-07-16 NOTE — Telephone Encounter (Signed)
Patient is requesting zepbound be sent to [facility] outpatient pharmacy @ [facility] since they have it in stock. They only have the 10 mg not the 12.5 mg.

## 2022-07-16 NOTE — Telephone Encounter (Signed)
Caller states they received patient's most recent BMI/Vitals but also received additional past BMIs/ Vital signs over the past 3 years. States she needs to past vitals/BMIs re-faxed including patient identifiers on the forms. This can be re-faxed to [phone].

## 2022-07-19 ENCOUNTER — Other Ambulatory Visit: Payer: Self-pay

## 2022-07-19 ENCOUNTER — Other Ambulatory Visit (HOSPITAL_COMMUNITY): Payer: Self-pay

## 2022-07-19 MED ORDER — ZEPBOUND 10 MG/0.5ML ~~LOC~~ SOAJ
10.0000 mg | SUBCUTANEOUS | 0 refills | Status: DC
  Filled 2022-07-19: qty 2, 28d supply, fill #0

## 2022-07-19 NOTE — Telephone Encounter (Signed)
Rx sent to pharmacy   

## 2022-07-22 ENCOUNTER — Other Ambulatory Visit (HOSPITAL_COMMUNITY): Payer: Self-pay

## 2022-08-05 ENCOUNTER — Telehealth: Payer: Self-pay | Admitting: Physician Assistant

## 2022-08-05 NOTE — Telephone Encounter (Signed)
Prescription Request  08/05/2022  LOV: 07/12/2022  What is the name of the medication or equipment?  tirzepatide (ZEPBOUND) 10 MG/0.5ML Pen   Have you contacted your pharmacy to request a refill? Yes   Which pharmacy would you like this sent to?  [facility] [address] [place] [address] Phone: [phone] Fax: [phone]   Patient notified that their request is being sent to the clinical staff for review and that they should receive a response within 2 business days.   Please advise at Mobile [phone] (mobile)

## 2022-08-06 ENCOUNTER — Other Ambulatory Visit: Payer: Self-pay

## 2022-08-06 ENCOUNTER — Other Ambulatory Visit (HOSPITAL_COMMUNITY): Payer: Self-pay

## 2022-08-06 MED ORDER — ZEPBOUND 12.5 MG/0.5ML ~~LOC~~ SOAJ
12.5000 mg | SUBCUTANEOUS | 0 refills | Status: DC
  Filled 2022-08-06: qty 2, 28d supply, fill #0

## 2022-08-06 MED ORDER — ZEPBOUND 10 MG/0.5ML ~~LOC~~ SOAJ
10.0000 mg | SUBCUTANEOUS | 0 refills | Status: DC
  Filled 2022-08-06: qty 2, 28d supply, fill #0

## 2022-08-06 NOTE — Telephone Encounter (Signed)
Rx sent to patient pharmacy requested

## 2022-08-06 NOTE — Telephone Encounter (Signed)
Pt states wrong dosage was sent in. She is requesting 12.5 mg be sent to the same pharmacy. States they have one box left.

## 2022-08-06 NOTE — Telephone Encounter (Signed)
Correct dose sent.

## 2022-08-25 DIAGNOSIS — Z01419 Encounter for gynecological examination (general) (routine) without abnormal findings: Secondary | ICD-10-CM | POA: Diagnosis not present

## 2022-08-25 DIAGNOSIS — Z124 Encounter for screening for malignant neoplasm of cervix: Secondary | ICD-10-CM | POA: Diagnosis not present

## 2022-08-26 ENCOUNTER — Other Ambulatory Visit: Payer: Self-pay | Admitting: Physician Assistant

## 2022-08-26 ENCOUNTER — Other Ambulatory Visit (HOSPITAL_COMMUNITY): Payer: Self-pay

## 2022-08-26 MED ORDER — ZEPBOUND 12.5 MG/0.5ML ~~LOC~~ SOAJ
12.5000 mg | SUBCUTANEOUS | 0 refills | Status: DC
  Filled 2022-08-26 (×2): qty 2, 28d supply, fill #0

## 2022-09-05 LAB — HM PAP SMEAR: HPV, high-risk: NEGATIVE

## 2022-09-10 DIAGNOSIS — Z1231 Encounter for screening mammogram for malignant neoplasm of breast: Secondary | ICD-10-CM | POA: Diagnosis not present

## 2022-10-07 ENCOUNTER — Inpatient Hospital Stay: Payer: BC Managed Care – PPO | Attending: Hematology and Oncology | Admitting: Hematology and Oncology

## 2022-10-07 VITALS — BP 112/74 | HR 62 | Temp 97.9°F | Resp 18 | Ht 62.0 in | Wt 139.6 lb

## 2022-10-07 DIAGNOSIS — Z1501 Genetic susceptibility to malignant neoplasm of breast: Secondary | ICD-10-CM | POA: Diagnosis not present

## 2022-10-07 DIAGNOSIS — Z79899 Other long term (current) drug therapy: Secondary | ICD-10-CM | POA: Diagnosis not present

## 2022-10-07 DIAGNOSIS — Z90722 Acquired absence of ovaries, bilateral: Secondary | ICD-10-CM | POA: Diagnosis not present

## 2022-10-07 DIAGNOSIS — Z9079 Acquired absence of other genital organ(s): Secondary | ICD-10-CM | POA: Insufficient documentation

## 2022-10-07 DIAGNOSIS — Z806 Family history of leukemia: Secondary | ICD-10-CM | POA: Diagnosis not present

## 2022-10-07 DIAGNOSIS — Z1509 Genetic susceptibility to other malignant neoplasm: Secondary | ICD-10-CM

## 2022-10-07 DIAGNOSIS — Z1239 Encounter for other screening for malignant neoplasm of breast: Secondary | ICD-10-CM

## 2022-10-07 DIAGNOSIS — Z803 Family history of malignant neoplasm of breast: Secondary | ICD-10-CM | POA: Insufficient documentation

## 2022-10-07 NOTE — Progress Notes (Signed)
[facility]  [phone] [phone]    ID: [name] DOB: [birth year]  MR#: [id number]  WUX#:[id number]  Patient Care Team: Allwardt, Crist Infante, PA-C as PCP - General (Physician Assistant) Randolph, Michelle Hue, MD (Inactive) as Consulting Physician (Oncology) Michelle Fellows, MD as Consulting Physician (Plastic Surgery) Michelle Fender, MD as Attending Physician (Radiology) OTHER MD:   CHIEF COMPLAINT: BRCA1 positive  CURRENT TREATMENT: Intensified screening   INTERVAL HISTORY:  [name] returns today for follow up of her BRCA1 mutation/breast cancer risk. Since her last visit here,she has lost much weight and feels wonderful. Since her last visit, she underwent MRI in Jan, mammogram in June at [place].  She had a copy of the report with surgery there were no concerns for malignancy.  She is now leaning towards considering bilateral mastectomy followed by plastic surgery and inquiring to any plastic surgeons locally.  She has previously met Dr. Leta Randolph but she is not quite sure if she wants to proceed with her.  She inquired into the surgeon at [facility] and he apparently moved to [facility]. She had salpingo-oophorectomy.  She continues to exercise regularly, walks about 5 to 6 miles daily, eats very healthy and is currently on zepbound for weight loss.  She denies any changes in her breast.  Rest of the pertinent 10 point ROS reviewed and negative   HISTORY OF CURRENT ILLNESS: From the original intake note:  [name] was recently diagnosed as BRCA 1 positive at c.213-11T>G on 04/05/2018 through the [facility] Multi-Cancer Panel.  She is here today to discuss management options.  For a full account of her family history and a pedigree please refer to the genetics counseling note dated 05/04/2018  The patient's subsequent history is as detailed below.   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   PAST SURGICAL HISTORY: Past Surgical History:  Procedure Laterality Date   CESAREAN SECTION  2007 & 2010   x 2 - at [facility]   CESAREAN SECTION WITH BILATERAL TUBAL LIGATION Bilateral 06/15/2014   Procedure: REPEAT CESAREAN SECTION WITH BILATERAL TUBAL LIGATION;  Surgeon: Michelle Mackie, MD;  Location: [facility];  Service: Obstetrics;  Laterality: Bilateral;  EDD: 06/19/14   COLONOSCOPY WITH PROPOFOL  08/31/2012   ROBOTIC ASSISTED SALPINGO OOPHERECTOMY Right 10/30/2018   Procedure: XI ROBOTIC ASSISTED SALPINGO OOPHORECTOMY/With Lysis of Adhesions;  Surgeon: Michelle Mackie, MD;  Location: [facility];  Service: Gynecology;  Laterality: Right;  Requests 2 hrs.   TRIGGER FINGER RELEASE Left    TUBAL LIGATION  2016   wisdom teeth ext  teen    FAMILY HISTORY: Family History  Problem Relation Age of Onset   Diabetes Mother    Breast cancer Mother        dx 60 and 71, BRCA1+   Colon polyps Mother    Fibromyalgia Mother    Cancer Mother    Obesity Mother    Varicose Veins Mother    Kidney disease Father    Heart disease Father  Hypertension Father    Alcohol abuse Father    Obesity Father    Stroke Father    Diabetes Sister    Obesity Sister    Cancer Maternal Grandmother        breast dx 93s, d. 60s   Breast cancer Paternal Aunt        dx 11s   Leukemia Cousin        dx childhood   [name]'s father died from heart disease and kidney failure at age 28. Patients' mother is 104 as of 05/2018, with a history of BRCA1+ and a diagnosis of breast cancer x2. The patient has 1 sister.  Patient denies anyone in her family having ovarian, prostate, or pancreatic cancer. [name]'s maternal grandmother was diagnosed with breast cancer. [name]'s maternal uncle and his daughter are also both BRCA1+.    GYNECOLOGIC HISTORY:  No LMP recorded. (Menstrual status: Oophorectomy). 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 Her husband, [name], works in Airline pilot. They have three children, ages 91 (05/2005), 9 (07/2009), and 3 (06/2015).     ADVANCED DIRECTIVES: Her husband, [name], is her healthcare power of attorney in the absence of documents to the contrary.      HEALTH MAINTENANCE: Social History   Tobacco Use   Smoking status: Never   Smokeless tobacco: Never  Vaping Use   Vaping status: Never Used  Substance Use Topics   Alcohol use: Not Currently   Drug use: No    Colonoscopy: n/a  PAP: up to date  Bone density: n/a Mammography: [facility]. 06/2017.    No Known Allergies  Current Outpatient Medications  Medication Sig Dispense Refill   levothyroxine (SYNTHROID) 100 MCG tablet Take 1 tablet (100 mcg total) by mouth daily. 90 tablet 0   ALPRAZolam (XANAX) 0.5 MG tablet Take 1 tablet by mouth as needed.     Estradiol 10 MCG TABS vaginal tablet Place 10 mcg vaginally 2 (two) times a week.     hydrocortisone (ANUSOL-HC) 2.5 % rectal cream Place 1 Application rectally 2 (two) times daily. HC cream 2.5% apply externally BID for 10 days. 1 g 2   ibuprofen (ADVIL) 200 MG tablet Take 200 mg by mouth every 6 (six) hours as needed.     Multiple Vitamins-Minerals (AIRBORNE PO) Take by mouth as needed.     rosuvastatin (CRESTOR) 5 MG tablet Take 5 mg by mouth daily.     tirzepatide (ZEPBOUND) 12.5 MG/0.5ML Pen Inject 12.5 mg into the skin once a week. 2 mL  0   tretinoin (RETIN-A) 0.025 % cream Apply 1 Application topically daily.     Current Facility-Administered Medications  Medication Dose Route Frequency Provider Last Rate Last Admin   0.9 %  sodium chloride infusion  500 mL Intravenous Once [name], MD        OBJECTIVE: white woman who appears well  Vitals:   10/07/22 1405  BP: 112/74  Pulse: 62  Resp: 18  Temp: 97.9 F (36.6 C)  SpO2: 100%      Body mass index is 25.53 kg/m.   Wt Readings from Last 3 Encounters:  10/07/22 139 lb 9.6 oz (63.3 kg)  07/12/22 153 lb 9.6 oz (69.7 kg)  04/02/22 165 lb 6.4 oz (75  kg)      ECOG FS:0 - Asymptomatic  Sclerae unicteric, EOMs intact HEENT: Atraumatic, normocephalic, chest: Clear to auscultation bilaterally Heart: Rate and rhythm regular Abdomen soft, nontender, nondistended, no organomegaly Breasts: Bilateral breasts inspected.  No palpable masses or regional adenopathy Extremities: No lower extremity edema  LAB RESULTS:  CMP     Component Value Date/Time   NA 139 01/19/2022 0000   K 5.0 01/19/2022 0000   CL 102 01/19/2022 0000   CO2 23 (A) 01/19/2022 0000   GLUCOSE 116 (H) 06/14/2014 1045   BUN 10 01/19/2022 0000   CREATININE 1.0 01/19/2022 0000   CREATININE 0.66 06/14/2014 1045   CALCIUM 10.0 01/19/2022 0000   ALBUMIN 4.9 01/19/2022 0000   AST 29 01/19/2022 0000   ALT 18 01/19/2022 0000   ALKPHOS 94 01/19/2022 0000   GFRNONAA >90 06/14/2014 1045   GFRAA >90 06/14/2014 1045    No results found for: "TOTALPROTELP", "ALBUMINELP", "A1GS", "A2GS", "BETS", "BETA2SER", "GAMS", "MSPIKE", "SPEI"  No results found for: "KPAFRELGTCHN", "LAMBDASER", "KAPLAMBRATIO"  Lab Results  Component Value Date   WBC 5.0 01/19/2022   NEUTROABS 3.10 01/19/2022   HGB 14.0 01/19/2022   HCT 43 01/19/2022   MCV 88.2 10/26/2018   PLT 225 01/19/2022   No results found for: "LABCA2"  No components found for: "FIEPPI951"  No results for input(s): "INR" in the last 168  hours.  No results found for: "LABCA2"  No results found for: "OAC166"  No results found for: "CAN125"  No results found for: "CAN153"  No results found for: "CA2729"  No components found for: "HGQUANT"  No results found for: "CEA1", "CEA" / No results found for: "CEA1", "CEA"   No results found for: "AFPTUMOR"  No results found for: "CHROMOGRNA"  No results found for: "HGBA", "HGBA2QUANT", "HGBFQUANT", "HGBSQUAN" (Hemoglobinopathy evaluation)   No results found for: "LDH"  No results found for: "IRON", "TIBC", "IRONPCTSAT" (Iron and TIBC)  No results found for: "FERRITIN"  Urinalysis No results found for: "COLORURINE", "APPEARANCEUR", "LABSPEC", "PHURINE", "GLUCOSEU", "HGBUR", "BILIRUBINUR", "KETONESUR", "PROTEINUR", "UROBILINOGEN", "NITRITE", "LEUKOCYTESUR"   STUDIES:  No results found.   ELIGIBLE FOR AVAILABLE RESEARCH PROTOCOL: no   ASSESSMENT: 47 y.o. [place], [place] woman positive for a deleterious BRCA1 mutation, BRCA1 c.213-11T>G ([employer]).  (1) genetics testing 04/12/2018 through the multi-cancer panel offered by in vitae found no additional mutations in AIP, ALK, APC, ATM, AXIN2,BAP1,  BARD1, BLM, BMPR1A, BRCA1, BRCA2, BRIP1, CASR, CDC73, CDH1, CDK4, CDKN1B, CDKN1C, CDKN2A (p14ARF), CDKN2A (p16INK4a), CEBPA, CHEK2, CTNNA1, DICER1, DIS3L2, EGFR (c.2369C>T, p.Thr790Met variant only), EPCAM (Deletion/duplication testing only), FH, FLCN, GATA2, GPC3, GREM1 (Promoter region deletion/duplication testing only), HOXB13 (c.251G>A, p.Gly84Glu), HRAS, KIT, MAX, MEN1, MET, MITF (c.952G>A, p.Glu318Lys variant only), MLH1, MSH2, MSH3, MSH6, MUTYH, NBN, NF1, NF2, NTHL1, PALB2, PDGFRA, PHOX2B, PMS2, POLD1, POLE, POT1, PRKAR1A, PTCH1, PTEN, RAD50, RAD51C, RAD51D, RB1, RECQL4, RET, RUNX1, SDHAF2, SDHA (sequence changes only), SDHB, SDHC, SDHD, SMAD4, SMARCA4, SMARCB1, SMARCE1, STK11, SUFU, TERC, TERT, TMEM127, TP53, TSC1, TSC2, VHL, WRN and WT1.  (2) risk reduction:   (a) status  post left salpingectomy and right salpingooophorectomy 10/30/2018 with benign pathology   (i) patient had congenital absence of left ovary   (b) opted against anti- estrogens  (c) considering bilateral mastectomies  (3) intensified screening:  (a) mammography  breast MRI December and biannual MD breast exam  (4) menopausal symptoms  (a) on gabapentin po Qhs  (b) opted against venlafaxine  (c) considering pelvic rehabilitation   PLAN: Ms. Madelon is here for  follow-up.  She has BRCA1 gene and hence undergoes intensified screening.  Her recent mammography in [place] is unremarkable. MRI due in Jan 2025. She is interested in considering surgery again and wants to meet with plastic surgery again.  She previously met Dr. Leta Randolph but would like to pursue other options.  I encouraged her to research more plastic surgeons locally and if she is interested, we are happy to send a referral to our plastic surgeon.  In the meantime she will continue intensified screening.  No concerns on breast exam today.  I have encouraged her excellent exercise regimen and ongoing weight loss.  She feels very motivated to continue his abound for a few more months.  Total encounter time 30 minutes.  *Total Encounter Time as defined by the Centers for Medicare and Medicaid Services includes, in addition to the face-to-face time of a patient visit (documented in the note above) non-face-to-face time: obtaining and reviewing outside history, ordering and reviewing medications, tests or procedures, care coordination (communications with other health care professionals or caregivers) and documentation in the medical record.

## 2022-10-08 ENCOUNTER — Other Ambulatory Visit: Payer: Self-pay | Admitting: Physician Assistant

## 2022-10-08 ENCOUNTER — Encounter: Payer: Self-pay | Admitting: Hematology and Oncology

## 2022-10-11 ENCOUNTER — Ambulatory Visit: Payer: BC Managed Care – PPO | Admitting: Physician Assistant

## 2022-10-11 ENCOUNTER — Other Ambulatory Visit: Payer: Self-pay | Admitting: Physician Assistant

## 2022-10-11 ENCOUNTER — Encounter: Payer: Self-pay | Admitting: Physician Assistant

## 2022-10-11 VITALS — BP 118/68 | HR 74 | Temp 97.9°F | Wt 140.2 lb

## 2022-10-11 DIAGNOSIS — R5383 Other fatigue: Secondary | ICD-10-CM | POA: Diagnosis not present

## 2022-10-11 DIAGNOSIS — E039 Hypothyroidism, unspecified: Secondary | ICD-10-CM

## 2022-10-11 DIAGNOSIS — Z7689 Persons encountering health services in other specified circumstances: Secondary | ICD-10-CM | POA: Diagnosis not present

## 2022-10-11 LAB — CBC WITH DIFFERENTIAL/PLATELET
Basophils Absolute: 0 10*3/uL (ref 0.0–0.1)
Basophils Relative: 0.5 % (ref 0.0–3.0)
Eosinophils Absolute: 0 10*3/uL (ref 0.0–0.7)
Eosinophils Relative: 0.7 % (ref 0.0–5.0)
HCT: 37.4 % (ref 36.0–46.0)
Hemoglobin: 12.6 g/dL (ref 12.0–15.0)
Lymphocytes Relative: 39.3 % (ref 12.0–46.0)
Lymphs Abs: 1.5 10*3/uL (ref 0.7–4.0)
MCHC: 33.6 g/dL (ref 30.0–36.0)
MCV: 90.8 fl (ref 78.0–100.0)
Monocytes Absolute: 0.2 10*3/uL (ref 0.1–1.0)
Monocytes Relative: 6.6 % (ref 3.0–12.0)
Neutro Abs: 2 10*3/uL (ref 1.4–7.7)
Neutrophils Relative %: 52.9 % (ref 43.0–77.0)
Platelets: 187 10*3/uL (ref 150.0–400.0)
RBC: 4.12 Mil/uL (ref 3.87–5.11)
RDW: 13.1 % (ref 11.5–15.5)
WBC: 3.8 10*3/uL — ABNORMAL LOW (ref 4.0–10.5)

## 2022-10-11 LAB — IBC + FERRITIN
Ferritin: 51.1 ng/mL (ref 10.0–291.0)
Iron: 142 ug/dL (ref 42–145)
Saturation Ratios: 37.4 % (ref 20.0–50.0)
TIBC: 379.4 ug/dL (ref 250.0–450.0)
Transferrin: 271 mg/dL (ref 212.0–360.0)

## 2022-10-11 LAB — VITAMIN D 25 HYDROXY (VIT D DEFICIENCY, FRACTURES): VITD: 37.16 ng/mL (ref 30.00–100.00)

## 2022-10-11 LAB — T4, FREE: Free T4: 2.52 ng/dL — ABNORMAL HIGH (ref 0.60–1.60)

## 2022-10-11 LAB — T3, FREE: T3, Free: 5.4 pg/mL — ABNORMAL HIGH (ref 2.3–4.2)

## 2022-10-11 LAB — VITAMIN B12: Vitamin B-12: 603 pg/mL (ref 211–911)

## 2022-10-11 LAB — TSH: TSH: 0.08 u[IU]/mL — ABNORMAL LOW (ref 0.35–5.50)

## 2022-10-11 NOTE — Assessment & Plan Note (Signed)
Recheck labs today, currently on levothyroxine 100 mcg, adjust prn

## 2022-10-11 NOTE — Assessment & Plan Note (Signed)
Stable, maintaining, doing great. She is currently walking 5-6 miles daily; well-balanced low-carb, high protein diet. See HPI - starting wt was 230 lb three years ago. With [facility] start in June 2023, has lost another 40 lb in last year.  Currently 153 lb with BMI 28.09, with HLD comorbidity. Continue Zepbound 12.5 mg once weekly, consider reducing dose in the next year

## 2022-10-11 NOTE — Progress Notes (Signed)
Subjective:    Patient ID: [name], female    DOB: 12-27-1975, 47 y.o.   MRN: [id number]  Chief Complaint  Patient presents with   Follow-up    3 months thyroid recheck     HPI Patient is in today for recheck on weight and thyroid. See A/P.  Interim - Seeing a plastic surgeon in a few weeks to talk about preventive mastectomy due to BRCA gene.   Past Medical History:  Diagnosis Date   Anxiety 2011   BRCA1 gene mutation positive    dx 01/ 2020   Encounter for weight management 07/12/2022   Family history of breast cancer    Gestational diabetes mellitus, class A2 06/17/2014   Gestational thrombocytopenia (HCC) 06/17/2014   Hemorrhoids    History of gestational diabetes    Hypothyroidism    followed by dr Billy Coast   Mixed hyperlipidemia 04/02/2022    Past Surgical History:  Procedure Laterality Date   CESAREAN SECTION  2007 & 2010   x 2 - at [facility]   CESAREAN SECTION WITH BILATERAL TUBAL LIGATION Bilateral 06/15/2014   Procedure: REPEAT CESAREAN SECTION WITH BILATERAL TUBAL LIGATION;  Surgeon: Olivia Mackie, MD;  Location: [facility];  Service: Obstetrics;  Laterality: Bilateral;  EDD: 06/19/14   COLONOSCOPY WITH PROPOFOL  08/31/2012   ROBOTIC ASSISTED SALPINGO OOPHERECTOMY Right 10/30/2018   Procedure: XI ROBOTIC ASSISTED SALPINGO OOPHORECTOMY/With Lysis of Adhesions;  Surgeon: Olivia Mackie, MD;  Location: [facility];  Service: Gynecology;  Laterality: Right;  Requests 2 hrs.   TRIGGER FINGER RELEASE Left    TUBAL LIGATION  2016   wisdom teeth ext  teen    Family History  Problem Relation Age of Onset   Diabetes Mother    Breast cancer Mother        dx 18 and 23, BRCA1+   Colon polyps Mother    Fibromyalgia Mother    Cancer Mother    Obesity Mother    Varicose Veins Mother    Kidney disease Father    Heart disease Father    Hypertension Father    Alcohol abuse Father    Obesity Father    Stroke Father    Diabetes Sister    Obesity Sister     Cancer Maternal Grandmother        breast dx 31s, d. 76s   Breast cancer Paternal Aunt        dx 64s   Leukemia Cousin        dx childhood    Social History   Tobacco Use   Smoking status: Never   Smokeless tobacco: Never  Vaping Use   Vaping status: Never Used  Substance Use Topics   Alcohol use: Not Currently   Drug use: No     No Known Allergies  Review of Systems NEGATIVE UNLESS OTHERWISE INDICATED IN HPI      Objective:     BP 118/68   Pulse 74   Temp 97.9 F (36.6 C)   Wt 140 lb 3.2 oz (63.6 kg)   SpO2 98%   BMI 25.64 kg/m   Wt Readings from Last 3 Encounters:  10/11/22 140 lb 3.2 oz (63.6 kg)  10/07/22 139 lb 9.6 oz (63.3 kg)  07/12/22 153 lb 9.6 oz (69.7 kg)    BP Readings from Last 3 Encounters:  10/11/22 118/68  10/07/22 112/74  07/12/22 124/84     Physical Exam Vitals and nursing note reviewed.  Constitutional:  Appearance: Normal appearance.  Eyes:     Extraocular Movements: Extraocular movements intact.     Conjunctiva/sclera: Conjunctivae normal.     Pupils: Pupils are equal, round, and reactive to light.  Cardiovascular:     Rate and Rhythm: Normal rate and regular rhythm.     Pulses: Normal pulses.     Heart sounds: No murmur heard. Pulmonary:     Effort: Pulmonary effort is normal.     Breath sounds: Normal breath sounds.  Neurological:     General: No focal deficit present.     Mental Status: She is alert and oriented to person, place, and time.  Psychiatric:        Mood and Affect: Mood normal.        Assessment & Plan:  Hypothyroidism, unspecified type Assessment & Plan: Recheck labs today, currently on levothyroxine 100 mcg, adjust prn  Orders: -     TSH -     T3, free -     T4, free  Other fatigue -     CBC with Differential/Platelet -     IBC + Ferritin -     VITAMIN D 25 Hydroxy (Vit-D Deficiency, Fractures) -     Vitamin B12  Encounter for weight management Assessment & Plan: Stable,  maintaining, doing great. She is currently walking 5-6 miles daily; well-balanced low-carb, high protein diet. See HPI - starting wt was 230 lb three years ago. With [facility] start in June 2023, has lost another 40 lb in last year.  Currently 153 lb with BMI 28.09, with HLD comorbidity. Continue Zepbound 12.5 mg once weekly, consider reducing dose in the next year       Return in about 8 months (around 06/11/2023) for recheck/follow-up, physical.    Crist Infante Elhadji Pecore, PA-C

## 2022-10-13 ENCOUNTER — Other Ambulatory Visit (HOSPITAL_COMMUNITY): Payer: Self-pay

## 2022-10-13 MED ORDER — LEVOTHYROXINE SODIUM 88 MCG PO TABS
88.0000 ug | ORAL_TABLET | ORAL | 1 refills | Status: DC
  Filled 2022-10-13: qty 30, 30d supply, fill #0

## 2022-10-13 MED ORDER — ZEPBOUND 12.5 MG/0.5ML ~~LOC~~ SOAJ
12.5000 mg | SUBCUTANEOUS | 0 refills | Status: DC
  Filled 2022-10-13: qty 2, 28d supply, fill #0

## 2022-10-13 NOTE — Telephone Encounter (Signed)
Pt needs RX asap!!

## 2022-10-29 DIAGNOSIS — Z1501 Genetic susceptibility to malignant neoplasm of breast: Secondary | ICD-10-CM | POA: Diagnosis not present

## 2022-10-29 DIAGNOSIS — Z8481 Family history of carrier of genetic disease: Secondary | ICD-10-CM | POA: Diagnosis not present

## 2022-10-29 DIAGNOSIS — N6481 Ptosis of breast: Secondary | ICD-10-CM | POA: Diagnosis not present

## 2022-11-01 ENCOUNTER — Other Ambulatory Visit: Payer: Self-pay | Admitting: Physician Assistant

## 2022-11-01 ENCOUNTER — Other Ambulatory Visit (HOSPITAL_COMMUNITY): Payer: Self-pay

## 2022-11-01 MED ORDER — ZEPBOUND 12.5 MG/0.5ML ~~LOC~~ SOAJ
12.5000 mg | SUBCUTANEOUS | 0 refills | Status: DC
  Filled 2022-11-01: qty 2, 28d supply, fill #0

## 2022-11-11 ENCOUNTER — Other Ambulatory Visit (INDEPENDENT_AMBULATORY_CARE_PROVIDER_SITE_OTHER): Payer: BC Managed Care – PPO

## 2022-11-11 DIAGNOSIS — R7989 Other specified abnormal findings of blood chemistry: Secondary | ICD-10-CM

## 2022-11-12 ENCOUNTER — Other Ambulatory Visit: Payer: Self-pay | Admitting: Radiology

## 2022-11-12 LAB — TSH: TSH: 0.73 u[IU]/mL (ref 0.35–5.50)

## 2022-11-12 MED ORDER — LEVOTHYROXINE SODIUM 88 MCG PO TABS: 88.0000 ug | ORAL_TABLET | ORAL | 1 refills | Status: DC

## 2022-11-26 DIAGNOSIS — Z803 Family history of malignant neoplasm of breast: Secondary | ICD-10-CM | POA: Diagnosis not present

## 2022-11-26 DIAGNOSIS — Z1501 Genetic susceptibility to malignant neoplasm of breast: Secondary | ICD-10-CM | POA: Diagnosis not present

## 2022-11-29 ENCOUNTER — Encounter: Payer: Self-pay | Admitting: Physician Assistant

## 2022-12-01 ENCOUNTER — Encounter: Payer: Self-pay | Admitting: Hematology and Oncology

## 2022-12-01 ENCOUNTER — Other Ambulatory Visit: Payer: Self-pay

## 2022-12-01 DIAGNOSIS — Z01812 Encounter for preprocedural laboratory examination: Secondary | ICD-10-CM

## 2022-12-03 ENCOUNTER — Telehealth: Payer: Self-pay | Admitting: General Surgery

## 2022-12-03 MED ORDER — ZEPBOUND 10 MG/0.5ML ~~LOC~~ SOAJ: 10.0000 mg | SUBCUTANEOUS | 2 refills | Status: DC

## 2022-12-03 NOTE — Telephone Encounter (Signed)
Called pt to try and set up an apt with [facility], ref from Dr [name] MD, patient refused.

## 2022-12-14 ENCOUNTER — Other Ambulatory Visit: Payer: BC Managed Care – PPO

## 2022-12-14 DIAGNOSIS — Z01812 Encounter for preprocedural laboratory examination: Secondary | ICD-10-CM | POA: Diagnosis not present

## 2022-12-14 LAB — CBC WITH DIFFERENTIAL/PLATELET
Basophils Absolute: 0 10*3/uL (ref 0.0–0.1)
Basophils Relative: 0.4 % (ref 0.0–3.0)
Eosinophils Absolute: 0 10*3/uL (ref 0.0–0.7)
Eosinophils Relative: 0.9 % (ref 0.0–5.0)
HCT: 38.3 % (ref 36.0–46.0)
Hemoglobin: 12.9 g/dL (ref 12.0–15.0)
Lymphocytes Relative: 28.1 % (ref 12.0–46.0)
Lymphs Abs: 1 10*3/uL (ref 0.7–4.0)
MCHC: 33.7 g/dL (ref 30.0–36.0)
MCV: 90.9 fL (ref 78.0–100.0)
Monocytes Absolute: 0.2 10*3/uL (ref 0.1–1.0)
Monocytes Relative: 7 % (ref 3.0–12.0)
Neutro Abs: 2.3 10*3/uL (ref 1.4–7.7)
Neutrophils Relative %: 63.6 % (ref 43.0–77.0)
Platelets: 201 10*3/uL (ref 150.0–400.0)
RBC: 4.21 Mil/uL (ref 3.87–5.11)
RDW: 12.9 % (ref 11.5–15.5)
WBC: 3.6 10*3/uL — ABNORMAL LOW (ref 4.0–10.5)

## 2022-12-14 LAB — COMPREHENSIVE METABOLIC PANEL
ALT: 22 U/L (ref 0–35)
AST: 33 U/L (ref 0–37)
Albumin: 4.3 g/dL (ref 3.5–5.2)
Alkaline Phosphatase: 58 U/L (ref 39–117)
BUN: 19 mg/dL (ref 6–23)
CO2: 27 meq/L (ref 19–32)
Calcium: 9.4 mg/dL (ref 8.4–10.5)
Chloride: 104 meq/L (ref 96–112)
Creatinine, Ser: 0.91 mg/dL (ref 0.40–1.20)
GFR: 76.51 mL/min (ref >60.00)
Glucose, Bld: 84 mg/dL (ref 70–99)
Potassium: 4.4 meq/L (ref 3.5–5.1)
Sodium: 139 meq/L (ref 135–145)
Total Bilirubin: 0.8 mg/dL (ref 0.2–1.2)
Total Protein: 6.9 g/dL (ref 6.0–8.3)

## 2022-12-14 LAB — APTT: aPTT: 32.9 s (ref 25.4–36.8)

## 2022-12-14 LAB — HEMOGLOBIN A1C: Hgb A1c MFr Bld: 5.3 % (ref 4.6–6.5)

## 2022-12-14 LAB — PROTIME-INR
INR: 1.1 {ratio} — ABNORMAL HIGH (ref 0.8–1.0)
Prothrombin Time: 11.7 s (ref 9.6–13.1)

## 2023-01-06 ENCOUNTER — Other Ambulatory Visit: Payer: Self-pay | Admitting: Plastic Surgery

## 2023-01-06 DIAGNOSIS — Z8481 Family history of carrier of genetic disease: Secondary | ICD-10-CM | POA: Diagnosis not present

## 2023-01-06 DIAGNOSIS — Z9889 Other specified postprocedural states: Secondary | ICD-10-CM | POA: Insufficient documentation

## 2023-01-06 DIAGNOSIS — N6481 Ptosis of breast: Secondary | ICD-10-CM | POA: Diagnosis not present

## 2023-01-06 DIAGNOSIS — Z411 Encounter for cosmetic surgery: Secondary | ICD-10-CM | POA: Diagnosis not present

## 2023-01-10 LAB — SURGICAL PATHOLOGY

## 2023-01-14 DIAGNOSIS — Z8481 Family history of carrier of genetic disease: Secondary | ICD-10-CM | POA: Diagnosis not present

## 2023-01-18 ENCOUNTER — Other Ambulatory Visit: Payer: Self-pay | Admitting: General Surgery

## 2023-01-18 DIAGNOSIS — Z1509 Genetic susceptibility to other malignant neoplasm: Secondary | ICD-10-CM

## 2023-02-11 ENCOUNTER — Encounter: Payer: Self-pay | Admitting: Physician Assistant

## 2023-02-14 ENCOUNTER — Other Ambulatory Visit: Payer: Self-pay

## 2023-02-14 MED ORDER — ROSUVASTATIN CALCIUM 5 MG PO TABS: 5.0000 mg | ORAL_TABLET | Freq: Every day | ORAL | 3 refills | Status: DC

## 2023-02-23 ENCOUNTER — Encounter: Payer: Self-pay | Admitting: Physician Assistant

## 2023-02-23 DIAGNOSIS — Z01812 Encounter for preprocedural laboratory examination: Secondary | ICD-10-CM

## 2023-02-25 ENCOUNTER — Encounter: Payer: Self-pay | Admitting: Physician Assistant

## 2023-02-25 ENCOUNTER — Other Ambulatory Visit: Payer: Self-pay

## 2023-02-25 ENCOUNTER — Other Ambulatory Visit (HOSPITAL_COMMUNITY): Payer: Self-pay

## 2023-02-25 DIAGNOSIS — Z01812 Encounter for preprocedural laboratory examination: Secondary | ICD-10-CM

## 2023-02-25 MED ORDER — ZEPBOUND 10 MG/0.5ML ~~LOC~~ SOAJ
10.0000 mg | SUBCUTANEOUS | 2 refills | Status: DC
  Filled 2023-02-25: qty 2, 28d supply, fill #0

## 2023-02-25 NOTE — Telephone Encounter (Signed)
Please advise if ok to place future orders for patient

## 2023-02-27 ENCOUNTER — Ambulatory Visit
Admission: RE | Admit: 2023-02-27 | Discharge: 2023-02-27 | Disposition: A | Discharge status: Home / Self Care | Payer: BC Managed Care – PPO | Source: Ambulatory Visit | Attending: General Surgery | Admitting: General Surgery

## 2023-02-27 DIAGNOSIS — Z803 Family history of malignant neoplasm of breast: Secondary | ICD-10-CM | POA: Diagnosis not present

## 2023-02-27 DIAGNOSIS — Z1509 Genetic susceptibility to other malignant neoplasm: Secondary | ICD-10-CM | POA: Diagnosis not present

## 2023-02-27 DIAGNOSIS — Z1501 Genetic susceptibility to malignant neoplasm of breast: Secondary | ICD-10-CM | POA: Diagnosis not present

## 2023-02-27 DIAGNOSIS — R92323 Mammographic fibroglandular density, bilateral breasts: Secondary | ICD-10-CM | POA: Diagnosis not present

## 2023-02-27 MED ORDER — GADOPICLENOL 0.5 MMOL/ML IV SOLN
5.0000 mL | Freq: Once | INTRAVENOUS | Status: AC | PRN
  Administered 2023-02-27: 5 mL via INTRAVENOUS

## 2023-03-03 DIAGNOSIS — Z803 Family history of malignant neoplasm of breast: Secondary | ICD-10-CM | POA: Diagnosis not present

## 2023-03-03 DIAGNOSIS — Z1501 Genetic susceptibility to malignant neoplasm of breast: Secondary | ICD-10-CM | POA: Diagnosis not present

## 2023-03-03 DIAGNOSIS — Z1509 Genetic susceptibility to other malignant neoplasm: Secondary | ICD-10-CM | POA: Diagnosis not present

## 2023-03-04 DIAGNOSIS — Z853 Personal history of malignant neoplasm of breast: Secondary | ICD-10-CM | POA: Diagnosis not present

## 2023-03-15 ENCOUNTER — Other Ambulatory Visit: Payer: BC Managed Care – PPO

## 2023-03-15 ENCOUNTER — Encounter: Payer: Self-pay | Admitting: Physician Assistant

## 2023-03-15 ENCOUNTER — Ambulatory Visit: Payer: BC Managed Care – PPO | Admitting: Physician Assistant

## 2023-03-15 VITALS — BP 124/78 | Temp 98.4°F | Ht 62.0 in | Wt 129.8 lb

## 2023-03-15 DIAGNOSIS — Z8659 Personal history of other mental and behavioral disorders: Secondary | ICD-10-CM

## 2023-03-15 DIAGNOSIS — Z01812 Encounter for preprocedural laboratory examination: Secondary | ICD-10-CM

## 2023-03-15 DIAGNOSIS — Z7689 Persons encountering health services in other specified circumstances: Secondary | ICD-10-CM | POA: Diagnosis not present

## 2023-03-15 DIAGNOSIS — K644 Residual hemorrhoidal skin tags: Secondary | ICD-10-CM | POA: Insufficient documentation

## 2023-03-15 DIAGNOSIS — E039 Hypothyroidism, unspecified: Secondary | ICD-10-CM | POA: Diagnosis not present

## 2023-03-15 LAB — CBC WITH DIFFERENTIAL/PLATELET
Basophils Absolute: 0 10*3/uL (ref 0.0–0.1)
Basophils Relative: 0.4 % (ref 0.0–3.0)
Eosinophils Absolute: 0 10*3/uL (ref 0.0–0.7)
Eosinophils Relative: 0.4 % (ref 0.0–5.0)
HCT: 39.9 % (ref 36.0–46.0)
Hemoglobin: 13.3 g/dL (ref 12.0–15.0)
Lymphocytes Relative: 27.8 % (ref 12.0–46.0)
Lymphs Abs: 1.2 10*3/uL (ref 0.7–4.0)
MCHC: 33.3 g/dL (ref 30.0–36.0)
MCV: 93.9 fL (ref 78.0–100.0)
Monocytes Absolute: 0.3 10*3/uL (ref 0.1–1.0)
Monocytes Relative: 6 % (ref 3.0–12.0)
Neutro Abs: 2.8 10*3/uL (ref 1.4–7.7)
Neutrophils Relative %: 65.4 % (ref 43.0–77.0)
Platelets: 195 10*3/uL (ref 150.0–400.0)
RBC: 4.25 Mil/uL (ref 3.87–5.11)
RDW: 13.6 % (ref 11.5–15.5)
WBC: 4.3 10*3/uL (ref 4.0–10.5)

## 2023-03-15 LAB — COMPREHENSIVE METABOLIC PANEL
ALT: 21 U/L (ref 0–35)
AST: 28 U/L (ref 0–37)
Albumin: 4.3 g/dL (ref 3.5–5.2)
Alkaline Phosphatase: 65 U/L (ref 39–117)
BUN: 14 mg/dL (ref 6–23)
CO2: 28 meq/L (ref 19–32)
Calcium: 9.4 mg/dL (ref 8.4–10.5)
Chloride: 105 meq/L (ref 96–112)
Creatinine, Ser: 0.8 mg/dL (ref 0.40–1.20)
GFR: 89.15 mL/min (ref >60.00)
Glucose, Bld: 82 mg/dL (ref 70–99)
Potassium: 3.8 meq/L (ref 3.5–5.1)
Sodium: 140 meq/L (ref 135–145)
Total Bilirubin: 0.7 mg/dL (ref 0.2–1.2)
Total Protein: 7.2 g/dL (ref 6.0–8.3)

## 2023-03-15 LAB — TSH: TSH: 0.56 u[IU]/mL (ref 0.35–5.50)

## 2023-03-15 LAB — HEMOGLOBIN A1C: Hgb A1c MFr Bld: 5.4 % (ref 4.6–6.5)

## 2023-03-15 MED ORDER — ALPRAZOLAM 0.5 MG PO TABS: 0.5000 mg | ORAL_TABLET | Freq: Every evening | ORAL | 0 refills | Status: DC | PRN

## 2023-03-15 MED ORDER — ZEPBOUND 7.5 MG/0.5ML ~~LOC~~ SOAJ: 7.5000 mg | SUBCUTANEOUS | 2 refills | Status: DC

## 2023-03-15 NOTE — Assessment & Plan Note (Signed)
Currently stable.  Rarely takes alprazolam 0.5 mg. PDMP reviewed today, no red flags, filling appropriately.

## 2023-03-15 NOTE — Progress Notes (Signed)
 Patient ID: [name], female    DOB: [birth year], 47 y.o.   MRN: [id number]   Assessment & Plan:  Encounter for weight management -     Zepbound ; Inject 7.5 mg into the skin once a week.  Dispense: 2 mL; Refill: 2  History of panic attacks Assessment & Plan: Currently stable.  Rarely takes alprazolam  0.5 mg. PDMP reviewed today, no red flags, filling appropriately.   Orders: -     ALPRAZolam ; Take 1 tablet (0.5 mg total) by mouth at bedtime as needed for anxiety.  Dispense: 10 tablet; Refill: 0  Hypothyroidism, unspecified type -     TSH  Pre-operative laboratory examination -     CBC with Differential/Platelet -     Comprehensive metabolic panel -     Hemoglobin A1c -     Protime-INR -     APTT    Assessment and Plan    Weight Loss Significant weight loss of 105 pounds since 2021 with diet, exercise, and medication. Currently on 12.5mg  of weight loss medication every couple of weeks to maintain current weight. -Continue current regimen of medication, diet, and exercise. -Plan to titrate down medication with the goal of discontinuation by spring. -Request for another prior authorization for weight loss medication to have on hand if needed for maintenance.  BRCA Positive and Upcoming Mastectomy Patient had a tummy tuck and breast lift in preparation for a nipple sparing mastectomy scheduled in January. -Order labs for surgery including CBC, CMP, PT, APTT, A1c, and TSH.   General Health Maintenance -Continue Rosuvastatin  for cholesterol management. -Continue thyroid  medication. -Check thyroid  levels. -Schedule physical for end of March, with the option to push to summer depending on recovery from surgery.          No follow-ups on file.    Subjective:    Chief Complaint  Patient presents with   Weight Loss    Pt in office to discuss PA for Zepbound  is running out 03/17/23 and needs to discuss possible options if insurance doesn't approve;      HPI Discussed the use of AI scribe software for clinical note transcription with the patient, who gave verbal consent to proceed.  History of Present Illness   [name], a patient with a known BRCA gene mutation, presents for a routine follow-up. She reports a significant weight loss of 105 pounds since the beginning of 2021, achieved through diet, exercise, and medication. She notes feeling amazing and has increased her physical activity to include running up to four miles without stopping, an achievement she has never accomplished before. She also reports a resolution of her previous back pain.  In preparation for a planned mastectomy, [name] underwent a tummy tuck and breast lift in October. She reports feeling great post-surgery and has even increased her physical activity.  [name] is currently on a medication regimen that includes rosuvastatin  and thyroid  medication. She has been spacing out her weight loss medication to maintain her current weight. She is considering weaning off the medication by summer but would like to have a prior authorization in place in case she needs it for maintenance.       Past Medical History:  Diagnosis Date   Anxiety 2011   BRCA1 gene mutation positive    dx 01/ 2020   Encounter for weight management 07/12/2022   Family history of breast cancer    Gestational diabetes mellitus, class A2 06/17/2014   Gestational thrombocytopenia (HCC) 06/17/2014   Hemorrhoids  History of gestational diabetes    Hypothyroidism    followed by dr gorge   Mixed hyperlipidemia 04/02/2022    Past Surgical History:  Procedure Laterality Date   CESAREAN SECTION  2007 & 2010   x 2 - at [facility]   CESAREAN SECTION WITH BILATERAL TUBAL LIGATION Bilateral 06/15/2014   Procedure: REPEAT CESAREAN SECTION WITH BILATERAL TUBAL LIGATION;  Surgeon: Charlie Gorge, MD;  Location: [facility];  Service: Obstetrics;  Laterality: Bilateral;  EDD: 06/19/14   COLONOSCOPY WITH PROPOFOL    08/31/2012   ROBOTIC ASSISTED SALPINGO OOPHERECTOMY Right 10/30/2018   Procedure: XI ROBOTIC ASSISTED SALPINGO OOPHORECTOMY/With Lysis of Adhesions;  Surgeon: Gorge Charlie, MD;  Location: [facility];  Service: Gynecology;  Laterality: Right;  Requests 2 hrs.   TRIGGER FINGER RELEASE Left    TUBAL LIGATION  2016   wisdom teeth ext  teen    Family History  Problem Relation Age of Onset   Diabetes Mother    Breast cancer Mother        dx 33 and 66, BRCA1+   Colon polyps Mother    Fibromyalgia Mother    Cancer Mother    Obesity Mother    Varicose Veins Mother    Kidney disease Father    Heart disease Father    Hypertension Father    Alcohol abuse Father    Obesity Father    Stroke Father    Diabetes Sister    Obesity Sister    Cancer Maternal Grandmother        breast dx 28s, d. 31s   Breast cancer Paternal Aunt        dx 50s   Leukemia Cousin        dx childhood    Social History   Tobacco Use   Smoking status: Never   Smokeless tobacco: Never  Vaping Use   Vaping status: Never Used  Substance Use Topics   Alcohol use: Not Currently   Drug use: No     No Known Allergies  Review of Systems NEGATIVE UNLESS OTHERWISE INDICATED IN HPI      Objective:     BP 124/78 (BP Location: Left Arm, Patient Position: Sitting, Cuff Size: Normal)   Temp 98.4 F (36.9 C) (Temporal)   Ht 5' 2 (1.575 m)   Wt 129 lb 12.8 oz (58.9 kg)   SpO2 97%   BMI 23.74 kg/m   Wt Readings from Last 3 Encounters:  03/15/23 129 lb 12.8 oz (58.9 kg)  10/11/22 140 lb 3.2 oz (63.6 kg)  10/07/22 139 lb 9.6 oz (63.3 kg)    BP Readings from Last 3 Encounters:  03/15/23 124/78  10/11/22 118/68  10/07/22 112/74     Physical Exam Vitals and nursing note reviewed.  Constitutional:      Appearance: Normal appearance.  Eyes:     Extraocular Movements: Extraocular movements intact.     Conjunctiva/sclera: Conjunctivae normal.     Pupils: Pupils are equal, round, and  reactive to light.  Cardiovascular:     Rate and Rhythm: Normal rate and regular rhythm.     Pulses: Normal pulses.     Heart sounds: No murmur heard. Pulmonary:     Effort: Pulmonary effort is normal.     Breath sounds: Normal breath sounds.  Neurological:     General: No focal deficit present.     Mental Status: She is alert and oriented to person, place, and time.  Psychiatric:  Mood and Affect: Mood normal.          Legrande Hao M Rosangela Fehrenbach, PA-C

## 2023-03-15 NOTE — Addendum Note (Signed)
Addended by: Nash Shearer D on: 03/15/2023 12:55 PM   Modules accepted: Orders

## 2023-03-16 LAB — PROTIME-INR
INR: 1
Prothrombin Time: 11.2 s (ref 9.0–11.5)

## 2023-03-16 LAB — APTT: aPTT: 28 s (ref 23–32)

## 2023-03-17 DIAGNOSIS — Z853 Personal history of malignant neoplasm of breast: Secondary | ICD-10-CM | POA: Diagnosis not present

## 2023-03-24 ENCOUNTER — Other Ambulatory Visit (HOSPITAL_COMMUNITY): Payer: Self-pay

## 2023-03-24 ENCOUNTER — Telehealth: Payer: Self-pay

## 2023-03-24 NOTE — Telephone Encounter (Signed)
 Thank You.

## 2023-03-24 NOTE — Telephone Encounter (Signed)
 Pharmacy Patient Advocate Encounter  Received notification from [facility] that Prior Authorization for Zepbound  7.5MG /0.5ML pen-injectors  has been APPROVED from 03/24/2023 to 03/23/2024. Unable to obtain price due to refill too soon rejection, last fill date 03/15/2023 next available fill date01/22/2025   PA #/Case ID/Reference #: [id number]

## 2023-03-24 NOTE — Telephone Encounter (Signed)
What dose is patient currently taking?

## 2023-03-29 ENCOUNTER — Other Ambulatory Visit: Payer: Self-pay

## 2023-03-29 ENCOUNTER — Encounter (HOSPITAL_BASED_OUTPATIENT_CLINIC_OR_DEPARTMENT_OTHER): Payer: Self-pay | Admitting: General Surgery

## 2023-03-31 DIAGNOSIS — Z9012 Acquired absence of left breast and nipple: Secondary | ICD-10-CM | POA: Diagnosis not present

## 2023-04-05 ENCOUNTER — Other Ambulatory Visit: Payer: Self-pay | Admitting: General Surgery

## 2023-04-06 ENCOUNTER — Ambulatory Visit (HOSPITAL_BASED_OUTPATIENT_CLINIC_OR_DEPARTMENT_OTHER): Payer: BC Managed Care – PPO | Admitting: Anesthesiology

## 2023-04-06 ENCOUNTER — Other Ambulatory Visit: Payer: Self-pay

## 2023-04-06 ENCOUNTER — Encounter (HOSPITAL_BASED_OUTPATIENT_CLINIC_OR_DEPARTMENT_OTHER)
Admission: RE | Disposition: A | Discharge status: Home / Self Care | Payer: Self-pay | Source: Home / Self Care | Attending: Plastic Surgery

## 2023-04-06 ENCOUNTER — Encounter (HOSPITAL_BASED_OUTPATIENT_CLINIC_OR_DEPARTMENT_OTHER): Payer: Self-pay | Admitting: General Surgery

## 2023-04-06 ENCOUNTER — Observation Stay (HOSPITAL_BASED_OUTPATIENT_CLINIC_OR_DEPARTMENT_OTHER)
Admission: RE | Admit: 2023-04-06 | Discharge: 2023-04-07 | Disposition: A | Discharge status: Home / Self Care | Payer: BC Managed Care – PPO | Attending: Plastic Surgery | Admitting: Plastic Surgery

## 2023-04-06 DIAGNOSIS — N6031 Fibrosclerosis of right breast: Secondary | ICD-10-CM | POA: Insufficient documentation

## 2023-04-06 DIAGNOSIS — Z803 Family history of malignant neoplasm of breast: Secondary | ICD-10-CM | POA: Diagnosis not present

## 2023-04-06 DIAGNOSIS — Z853 Personal history of malignant neoplasm of breast: Secondary | ICD-10-CM | POA: Diagnosis not present

## 2023-04-06 DIAGNOSIS — N6021 Fibroadenosis of right breast: Secondary | ICD-10-CM | POA: Insufficient documentation

## 2023-04-06 DIAGNOSIS — N6022 Fibroadenosis of left breast: Secondary | ICD-10-CM | POA: Diagnosis not present

## 2023-04-06 DIAGNOSIS — Z01818 Encounter for other preprocedural examination: Principal | ICD-10-CM

## 2023-04-06 DIAGNOSIS — Z1501 Genetic susceptibility to malignant neoplasm of breast: Secondary | ICD-10-CM | POA: Diagnosis not present

## 2023-04-06 DIAGNOSIS — G8918 Other acute postprocedural pain: Secondary | ICD-10-CM | POA: Diagnosis not present

## 2023-04-06 DIAGNOSIS — Z4001 Encounter for prophylactic removal of breast: Principal | ICD-10-CM | POA: Insufficient documentation

## 2023-04-06 DIAGNOSIS — Z1589 Genetic susceptibility to other disease: Secondary | ICD-10-CM | POA: Diagnosis present

## 2023-04-06 DIAGNOSIS — N6489 Other specified disorders of breast: Secondary | ICD-10-CM | POA: Diagnosis not present

## 2023-04-06 DIAGNOSIS — Z1507 Genetic susceptibility to malignant neoplasm of urinary tract: Secondary | ICD-10-CM | POA: Diagnosis present

## 2023-04-06 DIAGNOSIS — N6032 Fibrosclerosis of left breast: Secondary | ICD-10-CM | POA: Insufficient documentation

## 2023-04-06 HISTORY — PX: NIPPLE SPARING MASTECTOMY: SHX6537

## 2023-04-06 HISTORY — PX: BREAST RECONSTRUCTION WITH PLACEMENT OF TISSUE EXPANDER AND FLEX HD (ACELLULAR HYDRATED DERMIS): SHX6295

## 2023-04-06 SURGERY — MASTECTOMY, NIPPLE SPARING
Anesthesia: Regional | Site: Breast | Laterality: Bilateral

## 2023-04-06 MED ORDER — ACETAMINOPHEN 325 MG PO TABS
650.0000 mg | ORAL_TABLET | Freq: Four times a day (QID) | ORAL | Status: DC | PRN
  Administered 2023-04-06: 650 mg via ORAL
  Filled 2023-04-06: qty 2

## 2023-04-06 MED ORDER — ROPIVACAINE HCL 5 MG/ML IJ SOLN
INTRAMUSCULAR | Status: DC | PRN
  Administered 2023-04-06: 30 mL via PERINEURAL

## 2023-04-06 MED ORDER — ROSUVASTATIN CALCIUM 5 MG PO TABS
5.0000 mg | ORAL_TABLET | Freq: Every day | ORAL | Status: DC
  Administered 2023-04-06: 5 mg via ORAL
  Filled 2023-04-06: qty 1

## 2023-04-06 MED ORDER — ONDANSETRON HCL 4 MG/2ML IJ SOLN: 4.0000 mg | Freq: Once | INTRAMUSCULAR | Status: DC | PRN

## 2023-04-06 MED ORDER — MIDAZOLAM HCL 2 MG/2ML IJ SOLN
INTRAMUSCULAR | Status: AC
  Filled 2023-04-06: qty 2

## 2023-04-06 MED ORDER — BUPIVACAINE HCL (PF) 0.25 % IJ SOLN
INTRAMUSCULAR | Status: AC
Start: 2023-04-06 — End: ?
  Filled 2023-04-06: qty 30

## 2023-04-06 MED ORDER — SUGAMMADEX SODIUM 200 MG/2ML IV SOLN
INTRAVENOUS | Status: DC | PRN
  Administered 2023-04-06: 200 mg via INTRAVENOUS

## 2023-04-06 MED ORDER — EPHEDRINE SULFATE (PRESSORS) 50 MG/ML IJ SOLN
INTRAMUSCULAR | Status: DC | PRN
  Administered 2023-04-06: 10 mg via INTRAVENOUS

## 2023-04-06 MED ORDER — PROPOFOL 500 MG/50ML IV EMUL
INTRAVENOUS | Status: DC | PRN
  Administered 2023-04-06: 35 ug/kg/min via INTRAVENOUS

## 2023-04-06 MED ORDER — OXYCODONE HCL 5 MG/5ML PO SOLN: 5.0000 mg | Freq: Once | ORAL | Status: DC | PRN

## 2023-04-06 MED ORDER — AMISULPRIDE (ANTIEMETIC) 5 MG/2ML IV SOLN
INTRAVENOUS | Status: AC
  Filled 2023-04-06: qty 4

## 2023-04-06 MED ORDER — MEPERIDINE HCL 25 MG/ML IJ SOLN: 6.2500 mg | INTRAMUSCULAR | Status: DC | PRN

## 2023-04-06 MED ORDER — KETOROLAC TROMETHAMINE 30 MG/ML IJ SOLN
30.0000 mg | Freq: Once | INTRAMUSCULAR | Status: AC | PRN
  Administered 2023-04-06: 30 mg via INTRAVENOUS

## 2023-04-06 MED ORDER — ROCURONIUM 10MG/ML (10ML) SYRINGE FOR MEDFUSION PUMP - OPTIME
INTRAVENOUS | Status: DC | PRN
  Administered 2023-04-06: 40 mg via INTRAVENOUS

## 2023-04-06 MED ORDER — ALPRAZOLAM 0.25 MG PO TABS: 0.5000 mg | ORAL_TABLET | Freq: Every evening | ORAL | Status: DC | PRN

## 2023-04-06 MED ORDER — LIDOCAINE 2% (20 MG/ML) 5 ML SYRINGE
INTRAMUSCULAR | Status: DC | PRN
  Administered 2023-04-06: 40 mg via INTRAVENOUS

## 2023-04-06 MED ORDER — ENSURE PRE-SURGERY PO LIQD: 296.0000 mL | Freq: Once | ORAL | Status: DC

## 2023-04-06 MED ORDER — HYDROMORPHONE HCL 1 MG/ML IJ SOLN
INTRAMUSCULAR | Status: AC
  Filled 2023-04-06: qty 0.5

## 2023-04-06 MED ORDER — PHENYLEPHRINE HCL (PRESSORS) 10 MG/ML IV SOLN
INTRAVENOUS | Status: DC | PRN
  Administered 2023-04-06 (×2): 160 ug via INTRAVENOUS
  Administered 2023-04-06: 80 ug via INTRAVENOUS
  Administered 2023-04-06: 160 ug via INTRAVENOUS
  Administered 2023-04-06: 80 ug via INTRAVENOUS

## 2023-04-06 MED ORDER — KETOROLAC TROMETHAMINE 30 MG/ML IJ SOLN
INTRAMUSCULAR | Status: AC
  Filled 2023-04-06: qty 1

## 2023-04-06 MED ORDER — METHOCARBAMOL 1000 MG/10ML IJ SOLN: 500.0000 mg | Freq: Three times a day (TID) | INTRAMUSCULAR | Status: DC | PRN

## 2023-04-06 MED ORDER — HYDROMORPHONE HCL 1 MG/ML IJ SOLN: 0.2500 mg | INTRAMUSCULAR | Status: DC | PRN

## 2023-04-06 MED ORDER — FENTANYL CITRATE (PF) 100 MCG/2ML IJ SOLN
INTRAMUSCULAR | Status: DC | PRN
  Administered 2023-04-06: 100 ug via INTRAVENOUS

## 2023-04-06 MED ORDER — LEVOTHYROXINE SODIUM 88 MCG PO TABS
88.0000 ug | ORAL_TABLET | Freq: Every morning | ORAL | Status: DC
Start: 2023-04-07 — End: 2023-04-07
  Administered 2023-04-07: 88 ug via ORAL
  Filled 2023-04-06: qty 1

## 2023-04-06 MED ORDER — CEFAZOLIN SODIUM-DEXTROSE 2-4 GM/100ML-% IV SOLN
2.0000 g | INTRAVENOUS | Status: AC
  Administered 2023-04-06: 2 g via INTRAVENOUS

## 2023-04-06 MED ORDER — FENTANYL CITRATE (PF) 100 MCG/2ML IJ SOLN
INTRAMUSCULAR | Status: AC
  Filled 2023-04-06: qty 2

## 2023-04-06 MED ORDER — PROPOFOL 10 MG/ML IV BOLUS
INTRAVENOUS | Status: AC
  Filled 2023-04-06: qty 20

## 2023-04-06 MED ORDER — LACTATED RINGERS IV SOLN
INTRAVENOUS | Status: AC
Start: 2023-04-06 — End: 2023-04-07

## 2023-04-06 MED ORDER — FENTANYL CITRATE (PF) 100 MCG/2ML IJ SOLN
100.0000 ug | Freq: Once | INTRAMUSCULAR | Status: AC
  Administered 2023-04-06: 100 ug via INTRAVENOUS

## 2023-04-06 MED ORDER — ACETAMINOPHEN 500 MG PO TABS
ORAL_TABLET | ORAL | Status: AC
Start: 2023-04-06 — End: ?
  Filled 2023-04-06: qty 2

## 2023-04-06 MED ORDER — CHLORHEXIDINE GLUCONATE CLOTH 2 % EX PADS: 6.0000 | MEDICATED_PAD | Freq: Once | CUTANEOUS | Status: DC

## 2023-04-06 MED ORDER — PROPOFOL 10 MG/ML IV BOLUS
INTRAVENOUS | Status: DC | PRN
  Administered 2023-04-06: 130 mg via INTRAVENOUS

## 2023-04-06 MED ORDER — BUPIVACAINE LIPOSOME 1.3 % IJ SUSP
INTRAMUSCULAR | Status: DC | PRN
  Administered 2023-04-06: 10 mL via PERINEURAL

## 2023-04-06 MED ORDER — MIDAZOLAM HCL 2 MG/2ML IJ SOLN
2.0000 mg | Freq: Once | INTRAMUSCULAR | Status: AC
  Administered 2023-04-06: 2 mg via INTRAVENOUS

## 2023-04-06 MED ORDER — DEXAMETHASONE SODIUM PHOSPHATE 10 MG/ML IJ SOLN
INTRAMUSCULAR | Status: AC
  Filled 2023-04-06: qty 1

## 2023-04-06 MED ORDER — CEFAZOLIN SODIUM-DEXTROSE 2-4 GM/100ML-% IV SOLN
INTRAVENOUS | Status: AC
  Filled 2023-04-06: qty 100

## 2023-04-06 MED ORDER — DIPHENHYDRAMINE HCL 25 MG PO CAPS: 25.0000 mg | ORAL_CAPSULE | Freq: Four times a day (QID) | ORAL | Status: DC | PRN

## 2023-04-06 MED ORDER — ONDANSETRON HCL 4 MG/2ML IJ SOLN
INTRAMUSCULAR | Status: DC | PRN
  Administered 2023-04-06: 4 mg via INTRAVENOUS

## 2023-04-06 MED ORDER — LIDOCAINE 2% (20 MG/ML) 5 ML SYRINGE
INTRAMUSCULAR | Status: AC
  Filled 2023-04-06: qty 5

## 2023-04-06 MED ORDER — ONDANSETRON HCL 4 MG/2ML IJ SOLN
INTRAMUSCULAR | Status: AC
  Filled 2023-04-06: qty 2

## 2023-04-06 MED ORDER — HYDROCODONE-ACETAMINOPHEN 5-325 MG PO TABS
1.0000 | ORAL_TABLET | ORAL | Status: DC | PRN
  Administered 2023-04-06 – 2023-04-07 (×4): 1 via ORAL
  Filled 2023-04-06 (×4): qty 1

## 2023-04-06 MED ORDER — 0.9 % SODIUM CHLORIDE (POUR BTL) OPTIME
TOPICAL | Status: DC | PRN
  Administered 2023-04-06: 1000 mL

## 2023-04-06 MED ORDER — ONDANSETRON HCL 4 MG/2ML IJ SOLN
4.0000 mg | Freq: Four times a day (QID) | INTRAMUSCULAR | Status: DC | PRN
  Administered 2023-04-06: 4 mg via INTRAVENOUS
  Filled 2023-04-06: qty 2

## 2023-04-06 MED ORDER — MORPHINE SULFATE (PF) 4 MG/ML IV SOLN: 1.0000 mg | INTRAVENOUS | Status: DC | PRN

## 2023-04-06 MED ORDER — HYDROMORPHONE HCL 1 MG/ML IJ SOLN
INTRAMUSCULAR | Status: DC | PRN
  Administered 2023-04-06: .5 mg via INTRAVENOUS

## 2023-04-06 MED ORDER — METHOCARBAMOL 500 MG PO TABS
500.0000 mg | ORAL_TABLET | Freq: Three times a day (TID) | ORAL | Status: DC | PRN
  Administered 2023-04-07: 500 mg via ORAL
  Filled 2023-04-06: qty 1

## 2023-04-06 MED ORDER — DIPHENHYDRAMINE HCL 50 MG/ML IJ SOLN: 25.0000 mg | Freq: Four times a day (QID) | INTRAMUSCULAR | Status: DC | PRN

## 2023-04-06 MED ORDER — TRANEXAMIC ACID 1000 MG/10ML IV SOLN
INTRAVENOUS | Status: AC
  Filled 2023-04-06: qty 30

## 2023-04-06 MED ORDER — SCOPOLAMINE 1 MG/3DAYS TD PT72
MEDICATED_PATCH | TRANSDERMAL | Status: AC
  Filled 2023-04-06: qty 1

## 2023-04-06 MED ORDER — ACETAMINOPHEN 325 MG RE SUPP: 650.0000 mg | Freq: Four times a day (QID) | RECTAL | Status: DC | PRN

## 2023-04-06 MED ORDER — AMISULPRIDE (ANTIEMETIC) 5 MG/2ML IV SOLN
10.0000 mg | Freq: Once | INTRAVENOUS | Status: AC | PRN
  Administered 2023-04-06: 10 mg via INTRAVENOUS

## 2023-04-06 MED ORDER — ACETAMINOPHEN 500 MG PO TABS
1000.0000 mg | ORAL_TABLET | ORAL | Status: AC
  Administered 2023-04-06: 1000 mg via ORAL

## 2023-04-06 MED ORDER — SODIUM CHLORIDE 0.9 % IV SOLN: INTRAVENOUS | Status: DC | PRN

## 2023-04-06 MED ORDER — SODIUM CHLORIDE 0.9 % IV SOLN
INTRAVENOUS | Status: DC | PRN
  Administered 2023-04-06: 500 mL

## 2023-04-06 MED ORDER — DEXAMETHASONE SODIUM PHOSPHATE 4 MG/ML IJ SOLN
INTRAMUSCULAR | Status: DC | PRN
  Administered 2023-04-06: 5 mg via INTRAVENOUS

## 2023-04-06 MED ORDER — TRANEXAMIC ACID 1000 MG/10ML IV SOLN
Status: DC | PRN
  Administered 2023-04-06: 3000 mg via TOPICAL

## 2023-04-06 MED ORDER — OXYCODONE HCL 5 MG PO TABS: 5.0000 mg | ORAL_TABLET | Freq: Once | ORAL | Status: DC | PRN

## 2023-04-06 SURGICAL SUPPLY — 101 items
APPLIER CLIP 11 MED OPEN (CLIP) ×1
APPLIER CLIP 9.375 MED OPEN (MISCELLANEOUS)
BAG DECANTER FOR FLEXI CONT (MISCELLANEOUS) ×2 IMPLANT
BENZOIN TINCTURE PRP APPL 2/3 (GAUZE/BANDAGES/DRESSINGS) IMPLANT
BINDER BREAST LRG (GAUZE/BANDAGES/DRESSINGS) IMPLANT
BINDER BREAST MEDIUM (GAUZE/BANDAGES/DRESSINGS) IMPLANT
BINDER BREAST XLRG (GAUZE/BANDAGES/DRESSINGS) IMPLANT
BINDER BREAST XXLRG (GAUZE/BANDAGES/DRESSINGS) IMPLANT
BIOPATCH RED 1 DISK 7.0 (GAUZE/BANDAGES/DRESSINGS) IMPLANT
BLADE CLIPPER SURG (BLADE) IMPLANT
BLADE HEX COATED 2.75 (ELECTRODE) ×2 IMPLANT
BLADE SURG 10 STRL SS (BLADE) ×2 IMPLANT
BLADE SURG 15 STRL LF DISP TIS (BLADE) ×2 IMPLANT
BNDG ELASTIC 6X10 VLCR STRL LF (GAUZE/BANDAGES/DRESSINGS) IMPLANT
CANISTER SUCT 1200ML W/VALVE (MISCELLANEOUS) ×2 IMPLANT
CHLORAPREP W/TINT 26 (MISCELLANEOUS) ×4 IMPLANT
CLIP APPLIE 11 MED OPEN (CLIP) ×2 IMPLANT
CLIP APPLIE 9.375 MED OPEN (MISCELLANEOUS) IMPLANT
CLIP TI WIDE RED SMALL 6 (CLIP) IMPLANT
COVER BACK TABLE 60X90IN (DRAPES) ×2 IMPLANT
COVER MAYO STAND STRL (DRAPES) ×2 IMPLANT
COVER PROBE CYLINDRICAL 5X96 (MISCELLANEOUS) ×2 IMPLANT
DERMABOND ADVANCED .7 DNX12 (GAUZE/BANDAGES/DRESSINGS) ×4 IMPLANT
DRAIN CHANNEL 15F RND FF W/TCR (WOUND CARE) IMPLANT
DRAIN CHANNEL 19F RND (DRAIN) IMPLANT
DRAPE LAPAROSCOPIC ABDOMINAL (DRAPES) ×2 IMPLANT
DRAPE TOP ARMCOVERS (MISCELLANEOUS) ×2 IMPLANT
DRAPE U-SHAPE 76X120 STRL (DRAPES) IMPLANT
DRAPE UTILITY XL STRL (DRAPES) ×2 IMPLANT
DRSG TEGADERM 2-3/8X2-3/4 SM (GAUZE/BANDAGES/DRESSINGS) IMPLANT
DRSG TEGADERM 4X10 (GAUZE/BANDAGES/DRESSINGS) IMPLANT
DRSG TEGADERM 4X4.75 (GAUZE/BANDAGES/DRESSINGS) IMPLANT
ELECT BLADE 4.0 EZ CLEAN MEGAD (MISCELLANEOUS) ×1
ELECT BLADE 6.5 EXT (BLADE) IMPLANT
ELECT COATED BLADE 2.86 ST (ELECTRODE) IMPLANT
ELECT REM PT RETURN 9FT ADLT (ELECTROSURGICAL) ×1
ELECTRODE BLDE 4.0 EZ CLN MEGD (MISCELLANEOUS) IMPLANT
ELECTRODE REM PT RTRN 9FT ADLT (ELECTROSURGICAL) ×2 IMPLANT
EVACUATOR SILICONE 100CC (DRAIN) ×2 IMPLANT
FUNNEL KELLER 2 DISP (MISCELLANEOUS) IMPLANT
GAUZE PAD ABD 8X10 STRL (GAUZE/BANDAGES/DRESSINGS) ×4 IMPLANT
GAUZE SPONGE 4X4 12PLY STRL (GAUZE/BANDAGES/DRESSINGS) ×2 IMPLANT
GLOVE BIO SURGEON STRL SZ 6 (GLOVE) IMPLANT
GLOVE BIO SURGEON STRL SZ7 (GLOVE) ×2 IMPLANT
GLOVE BIOGEL M STRL SZ7.5 (GLOVE) ×2 IMPLANT
GLOVE BIOGEL PI IND STRL 7.5 (GLOVE) ×2 IMPLANT
GLOVE BIOGEL PI IND STRL 8 (GLOVE) IMPLANT
GOWN STRL REUS W/ TWL LRG LVL3 (GOWN DISPOSABLE) ×6 IMPLANT
GRAFT FLEX HD 19X22X0.7-1.4 (Tissue) IMPLANT
ILLUMINATOR WAVEGUIDE N/F (MISCELLANEOUS) IMPLANT
IMPL EXPANDER BREAST 375CC (Breast) IMPLANT
IMPLANT EXPANDER BREAST 375CC (Breast) ×2 IMPLANT
IV NS 500ML BAXH (IV SOLUTION) ×2 IMPLANT
KIT FILL ASEPTIC TRANSFER (MISCELLANEOUS) ×2 IMPLANT
KIT MARKER MARGIN INK (KITS) IMPLANT
LIGHT WAVEGUIDE WIDE FLAT (MISCELLANEOUS) IMPLANT
MARKER SKIN DUAL TIP RULER LAB (MISCELLANEOUS) IMPLANT
NDL HYPO 25X1 1.5 SAFETY (NEEDLE) IMPLANT
NDL SAFETY ECLIPSE 18X1.5 (NEEDLE) IMPLANT
NEEDLE HYPO 25X1 1.5 SAFETY (NEEDLE) IMPLANT
NS IRRIG 1000ML POUR BTL (IV SOLUTION) ×2 IMPLANT
PACK BASIN DAY SURGERY FS (CUSTOM PROCEDURE TRAY) ×2 IMPLANT
PACK SPY-PHI (KITS) IMPLANT
PENCIL SMOKE EVACUATOR (MISCELLANEOUS) ×2 IMPLANT
PIN SAFETY STERILE (MISCELLANEOUS) ×2 IMPLANT
RETRACTOR ONETRAX LX 135X30 (MISCELLANEOUS) IMPLANT
SHEET MEDIUM DRAPE 40X70 STRL (DRAPES) ×4 IMPLANT
SLEEVE SCD COMPRESS KNEE MED (STOCKING) ×2 IMPLANT
SPIKE FLUID TRANSFER (MISCELLANEOUS) IMPLANT
SPONGE T-LAP 18X18 ~~LOC~~+RFID (SPONGE) ×4 IMPLANT
STAPLER INSORB 30 2030 C-SECTI (MISCELLANEOUS) IMPLANT
STAPLER SKIN PROX WIDE 3.9 (STAPLE) ×2 IMPLANT
STRIP CLOSURE SKIN 1/2X4 (GAUZE/BANDAGES/DRESSINGS) ×4 IMPLANT
SUT ETHIBOND 2-0 V-5 NDL (SUTURE) IMPLANT
SUT ETHIBOND 2-0 V-5 NEEDLE (SUTURE) IMPLANT
SUT ETHILON 2 0 FS 18 (SUTURE) IMPLANT
SUT ETHILON 3 0 PS 1 (SUTURE) IMPLANT
SUT MNCRL AB 3-0 PS2 18 (SUTURE) IMPLANT
SUT MNCRL AB 4-0 PS2 18 (SUTURE) ×2 IMPLANT
SUT MON AB 3-0 SH27 (SUTURE) IMPLANT
SUT MON AB 4-0 PC3 18 (SUTURE) IMPLANT
SUT PDS 3-0 CT2 (SUTURE) ×7
SUT PDS AB 2-0 CT2 27 (SUTURE) IMPLANT
SUT PDS II 3-0 CT2 27 ABS (SUTURE) IMPLANT
SUT SILK 2 0 SH (SUTURE) IMPLANT
SUT STRATAFIX 0 PDS 27 VIOLET (SUTURE) ×2
SUT VIC AB 3-0 SH 27X BRD (SUTURE) IMPLANT
SUT VIC AB 4-0 PS2 18 (SUTURE) IMPLANT
SUT VICRYL 3-0 CR8 SH (SUTURE) IMPLANT
SUT VICRYL RAPIDE 4-0 (SUTURE) IMPLANT
SUT VLOC 90 P-14 23 (SUTURE) IMPLANT
SUTURE STRATFX 0 PDS 27 VIOLET (SUTURE) IMPLANT
SYR 50ML LL SCALE MARK (SYRINGE) IMPLANT
SYR BULB IRRIG 60ML STRL (SYRINGE) ×4 IMPLANT
SYR CONTROL 10ML LL (SYRINGE) IMPLANT
TAPE MEASURE VINYL STERILE (MISCELLANEOUS) IMPLANT
TOWEL GREEN STERILE FF (TOWEL DISPOSABLE) ×4 IMPLANT
TRAY FOLEY W/BAG SLVR 14FR LF (SET/KITS/TRAYS/PACK) IMPLANT
TUBE CONNECTING 20X1/4 (TUBING) ×2 IMPLANT
UNDERPAD 30X36 HEAVY ABSORB (UNDERPADS AND DIAPERS) ×4 IMPLANT
YANKAUER SUCT BULB TIP NO VENT (SUCTIONS) ×2 IMPLANT

## 2023-04-06 NOTE — Op Note (Signed)
Preoperative diagnosis: BRCA1 mutation Postoperative diagnosis: Same as above Procedure: Bilateral risk-reducing nipple sparing mastectomies Surgeon: Dr. Harden Mo Anesthesia: General With bilateral pectoral blocks Estimated blood loss: 100 cc Drains: Per plastic surgery Specimens: 1.  Right breast mastectomy with short stitch superior, long stitch lateral, double stitch deep 2.  Right nipple biopsy 3.  Left mastectomy with short stitch superior, long stitch lateral, double stitch deep 4.  Left nipple biopsy Complications: None Sponge count was correct at my completion. Disposition Case turned over to plastic surgery for reconstruction  Indications: This a 48 year old female with a BRCA1 mutation.  She is up-to-date and has a negative mammogram as well as an MRI.  She had desired nipple sparing mastectomy so she first underwent a lift as well as a reduction.  She now presents for risk reducing nipple sparing mastectomy.  Procedure: After informed consent was obtained she was taken to the operating room.  She had undergone bilateral pectoral blocks.  SCDs were in place.  She was given antibiotics.  She was placed under general anesthesia without complication.  She was prepped and draped in a standard sterile surgical fashion.  Surgical timeout was then performed.  I did the right side first.  I used a portion of the inframammary incision that she had had her reduction from.  I then created the posterior flap with cautery remove the pectoralis fashion from the pectoralis to the parasternal area, clavicle as well as the latissimus laterally.  I then created the anterior flap and remove the breast tissue from the skin using a combination of cautery as well as facelift scissors.  The breast was then marked as above and passed off the table.  I then remove the nipple with a 15 blade and sent this as a separate specimen.  Hemostasis was obtained.  I placed a TXA soaked sponge in the cavity for 5  minutes.  This was hemostatic upon completion and I placed another dry sponge.  I completed the left side of mastectomy in a similar fashion.  The case was then turned over to plastic surgeon for reconstruction.

## 2023-04-06 NOTE — H&P (Signed)
  48 year old female with a BRCA1 mutation found in 2019. She has a family history of breast cancer in her maternal mom and her maternal grandmother. Her mom's initial cancer was age 75. She is up-to-date on her mammogram. She has an MRI four days ago that is also negative. She has no other breast complaints. Has undergone lift and now desires to proceed with rr nsm.  Review of Systems: A complete review of systems was obtained from the patient. I have reviewed this information and discussed as appropriate with the patient. See HPI as well for other ROS.  Review of Systems  All other systems reviewed and are negative.   Medical History: Past Medical History:  Diagnosis Date  Thyroid disease   Patient Active Problem List  Diagnosis  (none) - all problems resolved or deleted   Past Surgical History:  Procedure Laterality Date  CESAREAN SECTION 2007  2010, 2016,   No Known Allergies  Current Outpatient Medications on File Prior to Visit  Medication Sig Dispense Refill  levothyroxine (SYNTHROID) 88 MCG tablet Take by mouth  multivitamin tablet Take 1 tablet by mouth once daily  rosuvastatin (CRESTOR) 5 MG tablet 1 TABLET ONE TIME A DAY  tretinoin (RETIN-A) 0.025 % cream Apply topically  ZEPBOUND 12.5 mg/0.5 mL pen injector Inject subcutaneously    Family History  Problem Relation Age of Onset  Skin cancer Mother  Obesity Mother  Diabetes Mother  Breast cancer Mother  High blood pressure (Hypertension) Father  Hyperlipidemia (Elevated cholesterol) Father  Coronary Artery Disease (Blocked arteries around heart) Father  Deep vein thrombosis (DVT or abnormal blood clot formation) Father    Social History   Tobacco Use  Smoking Status Never  Smokeless Tobacco Never  Marital status: Married  Tobacco Use  Smoking status: Never  Smokeless tobacco: Never  Substance and Sexual Activity  Alcohol use: Never  Drug use: Never   Objective:    Physical Exam Vitals reviewed.   Constitutional:  Appearance: Normal appearance.  Chest:  Breasts: Right: No inverted nipple, mass or nipple discharge.  Left: No inverted nipple, mass or nipple discharge.  Comments: Bilateral reduction incisions healed  Lymphadenopathy:  Right upper body: No supraclavicular or axillary adenopathy.  Left upper body: No supraclavicular or axillary adenopathy.  Neurological:  Mental Status: She is alert.    Assessment and Plan:   Bilateral nipple sparing mastectomies due to BRCA1 mutation  She has done well after the reduction which she combined with an abdominoplasty. She is a good candidate to do nipple sparing mastectomies for risk reduction. We discussed the risks including a small chance of breast cancer even after mastectomies as well as nipple and skin loss or partial loss. We discussed sensation as well. She is scheduled for about a month from now combined with plastic surgery.

## 2023-04-06 NOTE — Anesthesia Postprocedure Evaluation (Signed)
Anesthesia Post Note  Patient: [name]  Procedure(s) Performed: BILATERAL NIPPLE SPARING MASTECTOMY (Bilateral: Breast) BILATERAL BREAST RECONSTRUCTION WITH PLACEMENT OF TISSUE EXPANDER AND FLEX HD (ACELLULAR HYDRATED DERMIS) (Bilateral: Breast)     Patient location during evaluation: PACU Anesthesia Type: Regional and General Level of consciousness: awake and alert, oriented and patient cooperative Pain management: pain level controlled Vital Signs Assessment: post-procedure vital signs reviewed and stable Respiratory status: spontaneous breathing, nonlabored ventilation and respiratory function stable Cardiovascular status: blood pressure returned to baseline and stable Postop Assessment: no apparent nausea or vomiting Anesthetic complications: no   No notable events documented.  Last Vitals:  Vitals:   04/06/23 1300 04/06/23 1315  BP: 126/74 121/70  Pulse: 79 78  Resp: 18 12  Temp:    SpO2: 95% 95%    Last Pain:  Vitals:   04/06/23 1315  TempSrc:   PainSc: 0-No pain                 Lannie Fields

## 2023-04-06 NOTE — Anesthesia Procedure Notes (Signed)
Procedure Name: Intubation Date/Time: 04/06/2023 9:40 AM  Performed by: Karen Kitchens, CRNAPre-anesthesia Checklist: Patient identified, Emergency Drugs available, Suction available and Patient being monitored Patient Re-evaluated:Patient Re-evaluated prior to induction Oxygen Delivery Method: Circle system utilized Preoxygenation: Pre-oxygenation with 100% oxygen Induction Type: IV induction Ventilation: Mask ventilation without difficulty Laryngoscope Size: Mac and 4 Grade View: Grade I Tube type: Oral Tube size: 7.0 mm Number of attempts: 1 Airway Equipment and Method: Stylet and Oral airway Placement Confirmation: ETT inserted through vocal cords under direct vision, positive ETCO2, breath sounds checked- equal and bilateral and CO2 detector Secured at: 23 cm Tube secured with: Tape Dental Injury: Teeth and Oropharynx as per pre-operative assessment

## 2023-04-06 NOTE — Anesthesia Preprocedure Evaluation (Addendum)
Anesthesia Evaluation  Patient identified by MRN, date of birth, ID band Patient awake    Reviewed: Allergy & Precautions, H&P , NPO status , Patient's Chart, lab work & pertinent test results  Airway Mallampati: I  TM Distance: >3 FB Neck ROM: Full    Dental  (+) Teeth Intact, Dental Advisory Given   Pulmonary neg pulmonary ROS   Pulmonary exam normal breath sounds clear to auscultation       Cardiovascular negative cardio ROS Normal cardiovascular exam Rhythm:Regular Rate:Normal     Neuro/Psych  PSYCHIATRIC DISORDERS Anxiety     negative neurological ROS     GI/Hepatic negative GI ROS, Neg liver ROS,,,  Endo/Other  diabetes, Well ControlledHypothyroidism    Renal/GU negative Renal ROS  negative genitourinary   Musculoskeletal  (+) Arthritis , Osteoarthritis,    Abdominal   Peds negative pediatric ROS (+)  Hematology negative hematology ROS (+)   Anesthesia Other Findings Zepbound LD: 3 wks  Reproductive/Obstetrics negative OB ROS                             Anesthesia Physical Anesthesia Plan  ASA: 2  Anesthesia Plan: General and Regional   Post-op Pain Management: Tylenol PO (pre-op)*, Regional block*, Toradol IV (intra-op)* and Ketamine IV*   Induction: Intravenous  PONV Risk Score and Plan: 3 and Ondansetron, Dexamethasone, Midazolam and Treatment may vary due to age or medical condition  Airway Management Planned: Oral ETT  Additional Equipment: None  Intra-op Plan:   Post-operative Plan: Extubation in OR  Informed Consent: I have reviewed the patients History and Physical, chart, labs and discussed the procedure including the risks, benefits and alternatives for the proposed anesthesia with the patient or authorized representative who has indicated his/her understanding and acceptance.     Dental advisory given  Plan Discussed with: CRNA  Anesthesia Plan  Comments:        Anesthesia Quick Evaluation

## 2023-04-06 NOTE — Progress Notes (Signed)
Assisted Dr. Doroteo Glassman with left, right, pectoralis, ultrasound guided block. Side rails up, monitors on throughout procedure. See vital signs in flow sheet. Tolerated Procedure well.

## 2023-04-06 NOTE — Op Note (Signed)
Operative Note   DATE OF OPERATION: 04/06/2023  SURGICAL DEPARTMENT: Plastic Surgery  LOCATION: [facility]  PREOPERATIVE DIAGNOSES: High risk of breast cancer  POSTOPERATIVE DIAGNOSES:  same  PROCEDURE: Bilateral breast reconstruction with tissue expander and acellular dermal matrix  SURGEON: Ancil Linsey, MD  ASSISTANT: Herma Mering, RNFA Assistant was required throughout the case for retraction, hemostasis and wound closure  ANESTHESIA:  General.   COMPLICATIONS: None.   INDICATIONS FOR PROCEDURE:  The patient, [name] is a 48 y.o. female born on 15-Aug-1975, is here for treatment of high risk of breast cancer.  She is having bilateral prophylactic mastectomies with immediate reconstruction MRN: [id number]  CONSENT:  Informed consent was obtained directly from the patient. Risks, benefits and alternatives were fully discussed. Specific risks including but not limited to bleeding, infection, hematoma, seroma, scarring, pain, contracture, asymmetry, wound healing problems, and need for further surgery were all discussed. The patient did have an ample opportunity to have questions answered to satisfaction.   DESCRIPTION OF PROCEDURE:  The patient was taken to the operating room. SCDs were placed and antibiotics were given.  General anesthesia was administered.  The patient's operative site was prepped and draped in a sterile fashion. A time out was performed and all information was confirmed to be correct.  Dr. Dwain Sarna performed his portion of the case which will be dictated separately.  After he finished he turned the patient over to me.  I started by inspecting the mastectomy flaps which clinically looked great.  I inspected both pockets and ensured hemostasis.  The pocket did extend a bit laterally on each side so a 0 V-Loc was used to plicate the lateral pocket.  At this point 15 French drains were placed on each side and secured with nylon sutures.  I  then began to prepare the expanders.  2 pieces of Flex HD pliable prewere brought onto the field and soaked in saline according to the manufactures instructions.  A 3-0 PDS was then run around the periphery of this as a pursestring suture.  The expanders were then brought into the field.  I elected to use Mentor Arturo expanders with the prescribed fill volume of 375 cc.  Serial number on the left side [id number].  Serial number on the right side [id number].  All the air was removed from the expanders they were placed within the pursestring and the pursestring was tied down securing the acellular dermal matrix around the expander.  Suture tabs were brought out at 3, 6, 9:00.  At this point I again inspected both pockets and ensured hemostasis.  Both pockets were irrigated with triple antibiotic solution.  I then placed 3-0 PDS stay sutures in the chest wall the corresponding sites for the suture tabs.  The stay sutures were then run through the suture tabs and the expanders were placed and secured in position on each side.  At this point the inframammary incision was closed with a combination of buried 3-0 PDS and INSORB staplers and a running 3 oh V-Loc.  100 cc of saline was infiltrated into each expander which filled out the pocket but placed minimal additional tension on the skin.  Benzoin Steri-Strips and a soft dressing were applied.  The patient tolerated the procedure well.  There were no complications. The patient was allowed to wake from anesthesia, extubated and taken to the recovery room in satisfactory condition.

## 2023-04-06 NOTE — H&P (Signed)
Reason for Consult/CC: High risk of breast cancer  [name] is an 48 y.o. female.  HPI: Patient presents for bilateral nipple sparing mastectomy with reconstruction for high risk of breast cancer  Allergies: No Known Allergies  Medications:  Current Facility-Administered Medications:    0.9 %  sodium chloride infusion, 500 mL, Intravenous, Once, Michelle Bologna, MD  Current Outpatient Medications:    Estradiol 10 MCG TABS vaginal tablet, Place 10 mcg vaginally 2 (two) times a week., Disp: , Rfl:    levothyroxine (SYNTHROID) 88 MCG tablet, Take 1 tablet (88 mcg total) by mouth every morning., Disp: 90 tablet, Rfl: 1   Multiple Vitamins-Minerals (AIRBORNE PO), Take by mouth as needed., Disp: , Rfl:    ALPRAZolam (XANAX) 0.5 MG tablet, Take 1 tablet (0.5 mg total) by mouth at bedtime as needed for anxiety., Disp: 10 tablet, Rfl: 0   ibuprofen (ADVIL) 200 MG tablet, Take 200 mg by mouth every 6 (six) hours as needed., Disp: , Rfl:    rosuvastatin (CRESTOR) 5 MG tablet, Take 1 tablet (5 mg total) by mouth daily., Disp: 90 tablet, Rfl: 3   tirzepatide (ZEPBOUND) 10 MG/0.5ML Pen, Inject 10 mg into the skin once a week., Disp: 2 mL, Rfl: 2   tirzepatide (ZEPBOUND) 7.5 MG/0.5ML Pen, Inject 7.5 mg into the skin once a week., Disp: 2 mL, Rfl: 2   tretinoin (RETIN-A) 0.025 % cream, Apply 1 Application topically daily., Disp: , Rfl:   Past Medical History:  Diagnosis Date   Anxiety 2011   BRCA1 gene mutation positive    dx 01/ 2020   Encounter for weight management 07/12/2022   Family history of breast cancer    Gestational diabetes mellitus, class A2 06/17/2014   Gestational thrombocytopenia (HCC) 06/17/2014   Hemorrhoids    History of gestational diabetes    Hypothyroidism    followed by dr Billy Coast   Mixed hyperlipidemia 04/02/2022    Past Surgical History:  Procedure Laterality Date   CESAREAN SECTION  2007 & 2010   x 2 - at [facility]   CESAREAN SECTION WITH BILATERAL TUBAL LIGATION  Bilateral 06/15/2014   Procedure: REPEAT CESAREAN SECTION WITH BILATERAL TUBAL LIGATION;  Surgeon: Michelle Mackie, MD;  Location: [facility];  Service: Obstetrics;  Laterality: Bilateral;  EDD: 06/19/14   COLONOSCOPY WITH PROPOFOL  08/31/2012   ROBOTIC ASSISTED SALPINGO OOPHERECTOMY Right 10/30/2018   Procedure: XI ROBOTIC ASSISTED SALPINGO OOPHORECTOMY/With Lysis of Adhesions;  Surgeon: Michelle Mackie, MD;  Location: [facility];  Service: Gynecology;  Laterality: Right;  Requests 2 hrs.   TRIGGER FINGER RELEASE Left    TUBAL LIGATION  2016   wisdom teeth ext  teen    Family History  Problem Relation Age of Onset   Diabetes Mother    Breast cancer Mother        dx 51 and 35, BRCA1+   Colon polyps Mother    Fibromyalgia Mother    Cancer Mother    Obesity Mother    Varicose Veins Mother    Kidney disease Father    Heart disease Father    Hypertension Father    Alcohol abuse Father    Obesity Father    Stroke Father    Diabetes Sister    Obesity Sister    Cancer Maternal Grandmother        breast dx 68s, d. 40s   Breast cancer Paternal Aunt        dx 57s   Leukemia Cousin  dx childhood    Social History:  reports that she has never smoked. She has never used smokeless tobacco. She reports that she does not currently use alcohol. She reports that she does not use drugs.  Physical Exam Height 5' 1.5" (1.562 m), weight 56.7 kg. General: Well-developed well-nourished no acute distress Cardiovascular regular rate and rhythm Pulmonary unlabored  No results found for this or any previous visit (from the past 48 hours).  No results found.  Assessment/Plan: Patient is a good candidate for bilateral breast reconstruction.  We have extensively discussed details of the procedure and expectations along with risks.  Patient is fully understanding and interested in moving forward.  [name] 04/06/2023, 7:28 AM

## 2023-04-06 NOTE — Interval H&P Note (Signed)
History and Physical Interval Note:  04/06/2023 8:42 AM  [name]  has presented today for surgery, with the diagnosis of BRCA 1 POSITIVE.  The various methods of treatment have been discussed with the patient and family. After consideration of risks, benefits and other options for treatment, the patient has consented to  Procedure(s) with comments: BILATERAL NIPPLE SPARING MASTECTOMY (Bilateral) - PEC BLOCK BILATERAL BREAST RECONSTRUCTION WITH PLACEMENT OF TISSUE EXPANDER AND FLEX HD (ACELLULAR HYDRATED DERMIS) (Bilateral) as a surgical intervention.  The patient's history has been reviewed, patient examined, no change in status, stable for surgery.  I have reviewed the patient's chart and labs.  Questions were answered to the patient's satisfaction.     [name]

## 2023-04-06 NOTE — Transfer of Care (Signed)
Immediate Anesthesia Transfer of Care Note  Patient: [name]  Procedure(s) Performed: BILATERAL NIPPLE SPARING MASTECTOMY (Bilateral: Breast) BILATERAL BREAST RECONSTRUCTION WITH PLACEMENT OF TISSUE EXPANDER AND FLEX HD (ACELLULAR HYDRATED DERMIS) (Bilateral: Breast)  Patient Location: PACU  Anesthesia Type:General and Regional  Level of Consciousness: awake, alert , and patient cooperative  Airway & Oxygen Therapy: Patient Spontanous Breathing and Patient connected to face mask oxygen  Post-op Assessment: Report given to RN and Post -op Vital signs reviewed and stable  Post vital signs: Reviewed and stable  Last Vitals:  Vitals Value Taken Time  BP 112/72 04/06/23 1226  Temp    Pulse 65 04/06/23 1228  Resp 14 04/06/23 1228  SpO2 99 % 04/06/23 1228  Vitals shown include unfiled device data.  Last Pain:  Vitals:   04/06/23 0756  TempSrc: Temporal  PainSc: 0-No pain      Patients Stated Pain Goal: 3 (04/06/23 0756)  Complications: No notable events documented.

## 2023-04-06 NOTE — Anesthesia Procedure Notes (Signed)
Anesthesia Regional Block: Pectoralis block   Pre-Anesthetic Checklist: , timeout performed,  Correct Patient, Correct Site, Correct Laterality,  Correct Procedure, Correct Position, site marked,  Risks and benefits discussed,  Surgical consent,  Pre-op evaluation,  At surgeon's request and post-op pain management  Laterality: Left and Right  Prep: Maximum Sterile Barrier Precautions used, chloraprep       Needles:  Injection technique: Single-shot  Needle Type: Echogenic Stimulator Needle     Needle Length: 9cm  Needle Gauge: 22     Additional Needles:   Procedures:,,,, ultrasound used (permanent image in chart),,    Narrative:  Start time: 04/06/2023 9:00 AM End time: 04/06/2023 9:05 AM Injection made incrementally with aspirations every 5 mL.  Performed by: Personally  Anesthesiologist: Lannie Fields, DO  Additional Notes: Monitors applied. No increased pain on injection. No increased resistance to injection. Injection made in 5cc increments. Good needle visualization. Patient tolerated procedure well.

## 2023-04-06 NOTE — Discharge Instructions (Signed)
Information for Discharge Teaching: EXPAREL (bupivacaine liposome injectable suspension)   Pain relief is important to your recovery. The goal is to control your pain so you can move easier and return to your normal activities as soon as possible after your procedure. Your physician may use several types of medicines to manage pain, swelling, and more.  Your surgeon or anesthesiologist gave you EXPAREL(bupivacaine) to help control your pain after surgery.  EXPAREL is a local anesthetic designed to release slowly over an extended period of time to provide pain relief by numbing the tissue around the surgical site. EXPAREL is designed to release pain medication over time and can control pain for up to 72 hours. Depending on how you respond to EXPAREL, you may require less pain medication during your recovery. EXPAREL can help reduce or eliminate the need for opioids during the first few days after surgery when pain relief is needed the most. EXPAREL is not an opioid and is not addictive. It does not cause sleepiness or sedation.   Important! A teal colored band has been placed on your arm with the date, time and amount of EXPAREL you have received. Please leave this armband in place for the full 96 hours following administration, and then you may remove the band. If you return to the hospital for any reason within 96 hours following the administration of EXPAREL, the armband provides important information that your health care providers to know, and alerts them that you have received this anesthetic.    Possible side effects of EXPAREL: Temporary loss of sensation or ability to move in the area where medication was injected. Nausea, vomiting, constipation Rarely, numbness and tingling in your mouth or lips, lightheadedness, or anxiety may occur. Call your doctor right away if you think you may be experiencing any of these sensations, or if you have other questions regarding possible side  effects.  Follow all other discharge instructions given to you by your surgeon or nurse. Eat a healthy diet and drink plenty of water or other fluids.   About my Jackson-Pratt Bulb Drain  What is a Jackson-Pratt bulb? A Jackson-Pratt is a soft, round device used to collect drainage. It is connected to a long, thin drainage catheter, which is held in place by one or two small stiches near your surgical incision site. When the bulb is squeezed, it forms a vacuum, forcing the drainage to empty into the bulb.  Emptying the Jackson-Pratt bulb- To empty the bulb: 1. Release the plug on the top of the bulb. 2. Pour the bulb's contents into a measuring container which your nurse will provide. 3. Record the time emptied and amount of drainage. Empty the drain(s) as often as your     doctor or nurse recommends.  Date                  Time                    Amount (Drain 1)                 Amount (Drain 2)  _____________________________________________________________________  _____________________________________________________________________  _____________________________________________________________________  _____________________________________________________________________  _____________________________________________________________________  _____________________________________________________________________  _____________________________________________________________________  _____________________________________________________________________  Squeezing the Jackson-Pratt Bulb- To squeeze the bulb: 1. Make sure the plug at the top of the bulb is open. 2. Squeeze the bulb tightly in your fist. You will hear air squeezing from the bulb. 3. Replace the plug while the bulb is squeezed. 4. Use a safety  pin to attach the bulb to your clothing. This will keep the catheter from     pulling at the bulb insertion site.  When to call your doctor- Call your doctor if: Drain  site becomes red, swollen or hot. You have a fever greater than 101 degrees F. There is oozing at the drain site. Drain falls out (apply a guaze bandage over the drain hole and secure it with tape). Drainage increases daily not related to activity patterns. (You will usually have more drainage when you are active than when you are resting.) Drainage has a bad odor.

## 2023-04-07 ENCOUNTER — Encounter (HOSPITAL_BASED_OUTPATIENT_CLINIC_OR_DEPARTMENT_OTHER): Payer: Self-pay | Admitting: General Surgery

## 2023-04-07 DIAGNOSIS — Z4001 Encounter for prophylactic removal of breast: Secondary | ICD-10-CM | POA: Diagnosis not present

## 2023-04-07 DIAGNOSIS — N6032 Fibrosclerosis of left breast: Secondary | ICD-10-CM | POA: Diagnosis not present

## 2023-04-07 DIAGNOSIS — Z1501 Genetic susceptibility to malignant neoplasm of breast: Secondary | ICD-10-CM | POA: Diagnosis not present

## 2023-04-07 DIAGNOSIS — N6021 Fibroadenosis of right breast: Secondary | ICD-10-CM | POA: Diagnosis not present

## 2023-04-07 DIAGNOSIS — N6031 Fibrosclerosis of right breast: Secondary | ICD-10-CM | POA: Diagnosis not present

## 2023-04-07 DIAGNOSIS — N6022 Fibroadenosis of left breast: Secondary | ICD-10-CM | POA: Diagnosis not present

## 2023-04-07 NOTE — Discharge Summary (Signed)
Physician Discharge Summary  Patient ID: [name] MRN: [id number] DOB/AGE: 1975/09/02 47 y.o.  Admit date: 04/06/2023 Discharge date: 04/07/2023  Admission Diagnoses: BRCA mutation  Discharge Diagnoses:  Principal Problem:   BRCA positive   Discharged Condition: good  Hospital Course: 63 yof with BRCA mutation has undergone lift/reduction and now presents for risk reducing surgery. She underwent bilateral nsm with expander placement. Doing well following am, ready for dc.   Consults: None  Significant Diagnostic Studies: none  Treatments: surgery: see above  Discharge Exam: Blood pressure 105/69, pulse (!) 58, temperature 98.7 F (37.1 C), resp. rate 18, height 5\' 2"  (1.575 m), weight 59 kg, SpO2 99%. Drains serosang as expected Flaps/nac viable bilaterally  Disposition: Discharge disposition: 01-Home or Self Care        Allergies as of 04/07/2023   No Known Allergies      Medication List     TAKE these medications    ALPRAZolam 0.5 MG tablet Commonly known as: XANAX Take 1 tablet (0.5 mg total) by mouth at bedtime as needed for anxiety.   levothyroxine 88 MCG tablet Commonly known as: SYNTHROID Take 1 tablet (88 mcg total) by mouth every morning.   rosuvastatin 5 MG tablet Commonly known as: CRESTOR Take 1 tablet (5 mg total) by mouth daily.        Follow-up Information     [name], MD Follow up in 3 week(s).   Specialty: General Surgery Contact information: [address] [place] [address] [phone]                 Signed: [name] 04/07/2023, 6:49 AM

## 2023-04-11 DIAGNOSIS — Z853 Personal history of malignant neoplasm of breast: Secondary | ICD-10-CM | POA: Diagnosis not present

## 2023-04-11 LAB — SURGICAL PATHOLOGY

## 2023-04-14 DIAGNOSIS — Z853 Personal history of malignant neoplasm of breast: Secondary | ICD-10-CM | POA: Diagnosis not present

## 2023-04-18 DIAGNOSIS — Z853 Personal history of malignant neoplasm of breast: Secondary | ICD-10-CM | POA: Diagnosis not present

## 2023-04-19 ENCOUNTER — Other Ambulatory Visit: Payer: Self-pay

## 2023-04-19 ENCOUNTER — Encounter (HOSPITAL_BASED_OUTPATIENT_CLINIC_OR_DEPARTMENT_OTHER): Payer: Self-pay | Admitting: *Deleted

## 2023-04-19 NOTE — Progress Notes (Signed)
   04/19/23 1227  PAT Phone Screen  Is the patient taking a GLP-1 receptor agonist? (S)  No (Last dose of zepbound  03/16/23 (approx))  Do You Have Diabetes? Yes (diet controlled)  Do You Have Hypertension? No  Have You Ever Been to the ER for Asthma? No  Have You Taken Oral Steroids in the Past 3 Months? No  Do you Take Phenteramine or any Other Diet Drugs? No  Recent  Lab Work, EKG, CXR? No  Do you have a history of heart problems? No  Any Recent Hospitalizations? No  Height 5' 2 (1.575 m)  Weight 59 kg  Pat Appointment Scheduled No  Reason for No Appointment Not Needed

## 2023-04-21 NOTE — Anesthesia Preprocedure Evaluation (Addendum)
 Anesthesia Evaluation    Reviewed: Allergy & Precautions, H&P , Patient's Chart, lab work & pertinent test results, Unable to perform ROS - Chart review only  Airway Mallampati: I  TM Distance: >3 FB Neck ROM: Full    Dental no notable dental hx. (+) Dental Advisory Given, Teeth Intact   Pulmonary neg pulmonary ROS   Pulmonary exam normal breath sounds clear to auscultation       Cardiovascular + CAD  Normal cardiovascular exam Rhythm:Regular Rate:Normal     Neuro/Psych  PSYCHIATRIC DISORDERS Anxiety     negative neurological ROS     GI/Hepatic negative GI ROS, Neg liver ROS,,,  Endo/Other  diabetes, Well ControlledHypothyroidism    Renal/GU negative Renal ROS     Musculoskeletal  (+) Arthritis , Osteoarthritis,    Abdominal   Peds  Hematology negative hematology ROS (+)   Anesthesia Other Findings   Reproductive/Obstetrics                              Anesthesia Physical Anesthesia Plan  ASA: 2  Anesthesia Plan: General   Post-op Pain Management: Tylenol  PO (pre-op)*, Gabapentin  PO (pre-op)* and Celebrex PO (pre-op)*   Induction: Intravenous  PONV Risk Score and Plan: 3 and Ondansetron , Dexamethasone , Midazolam , Treatment may vary due to age or medical condition, Scopolamine  patch - Pre-op, Propofol  infusion and TIVA  Airway Management Planned: Oral ETT and LMA  Additional Equipment: None  Intra-op Plan:   Post-operative Plan: Extubation in OR  Informed Consent: I have reviewed the patients History and Physical, chart, labs and discussed the procedure including the risks, benefits and alternatives for the proposed anesthesia with the patient or authorized representative who has indicated his/her understanding and acceptance.     Dental advisory given  Plan Discussed with: CRNA  Anesthesia Plan Comments:         Anesthesia Quick Evaluation

## 2023-04-22 ENCOUNTER — Ambulatory Visit (HOSPITAL_BASED_OUTPATIENT_CLINIC_OR_DEPARTMENT_OTHER)
Admission: RE | Admit: 2023-04-22 | Discharge: 2023-04-22 | Disposition: A | Discharge status: Home / Self Care | Payer: BC Managed Care – PPO | Attending: Plastic Surgery | Admitting: Plastic Surgery

## 2023-04-22 ENCOUNTER — Ambulatory Visit (HOSPITAL_BASED_OUTPATIENT_CLINIC_OR_DEPARTMENT_OTHER): Payer: BC Managed Care – PPO | Admitting: Anesthesiology

## 2023-04-22 ENCOUNTER — Encounter (HOSPITAL_BASED_OUTPATIENT_CLINIC_OR_DEPARTMENT_OTHER): Payer: Self-pay | Admitting: Urology

## 2023-04-22 ENCOUNTER — Other Ambulatory Visit: Payer: Self-pay

## 2023-04-22 ENCOUNTER — Encounter (HOSPITAL_BASED_OUTPATIENT_CLINIC_OR_DEPARTMENT_OTHER)
Admission: RE | Disposition: A | Discharge status: Home / Self Care | Payer: Self-pay | Source: Home / Self Care | Attending: Plastic Surgery

## 2023-04-22 DIAGNOSIS — N6011 Diffuse cystic mastopathy of right breast: Secondary | ICD-10-CM | POA: Diagnosis not present

## 2023-04-22 DIAGNOSIS — Z853 Personal history of malignant neoplasm of breast: Secondary | ICD-10-CM | POA: Diagnosis not present

## 2023-04-22 DIAGNOSIS — N6012 Diffuse cystic mastopathy of left breast: Secondary | ICD-10-CM | POA: Diagnosis not present

## 2023-04-22 DIAGNOSIS — Z803 Family history of malignant neoplasm of breast: Secondary | ICD-10-CM | POA: Insufficient documentation

## 2023-04-22 DIAGNOSIS — Z9013 Acquired absence of bilateral breasts and nipples: Secondary | ICD-10-CM | POA: Insufficient documentation

## 2023-04-22 DIAGNOSIS — N641 Fat necrosis of breast: Secondary | ICD-10-CM | POA: Diagnosis not present

## 2023-04-22 DIAGNOSIS — N61 Mastitis without abscess: Secondary | ICD-10-CM | POA: Diagnosis not present

## 2023-04-22 DIAGNOSIS — Z421 Encounter for breast reconstruction following mastectomy: Secondary | ICD-10-CM | POA: Diagnosis not present

## 2023-04-22 DIAGNOSIS — Z1501 Genetic susceptibility to malignant neoplasm of breast: Secondary | ICD-10-CM | POA: Diagnosis not present

## 2023-04-22 HISTORY — PX: BREAST RECONSTRUCTION WITH PLACEMENT OF TISSUE EXPANDER AND FLEX HD (ACELLULAR HYDRATED DERMIS): SHX6295

## 2023-04-22 SURGERY — BREAST RECONSTRUCTION WITH PLACEMENT OF TISSUE EXPANDER AND FLEX HD (ACELLULAR HYDRATED DERMIS)
Anesthesia: General | Site: Breast | Laterality: Bilateral

## 2023-04-22 MED ORDER — ACETAMINOPHEN 500 MG PO TABS
1000.0000 mg | ORAL_TABLET | Freq: Once | ORAL | Status: AC
  Administered 2023-04-22: 1000 mg via ORAL

## 2023-04-22 MED ORDER — OXYCODONE HCL 5 MG/5ML PO SOLN: 5.0000 mg | Freq: Once | ORAL | Status: DC | PRN

## 2023-04-22 MED ORDER — DEXAMETHASONE SODIUM PHOSPHATE 4 MG/ML IJ SOLN
INTRAMUSCULAR | Status: DC | PRN
  Administered 2023-04-22: 5 mg via INTRAVENOUS

## 2023-04-22 MED ORDER — DROPERIDOL 2.5 MG/ML IJ SOLN: 0.6250 mg | Freq: Once | INTRAMUSCULAR | Status: DC | PRN

## 2023-04-22 MED ORDER — SODIUM CHLORIDE 0.9 % IV SOLN
INTRAVENOUS | Status: DC | PRN
  Administered 2023-04-22: 500 mL

## 2023-04-22 MED ORDER — DEXAMETHASONE SODIUM PHOSPHATE 10 MG/ML IJ SOLN
INTRAMUSCULAR | Status: AC
  Filled 2023-04-22: qty 1

## 2023-04-22 MED ORDER — CEFAZOLIN SODIUM-DEXTROSE 2-4 GM/100ML-% IV SOLN: 2.0000 g | INTRAVENOUS | Status: DC

## 2023-04-22 MED ORDER — ACETAMINOPHEN 500 MG PO TABS
ORAL_TABLET | ORAL | Status: AC
  Filled 2023-04-22: qty 2

## 2023-04-22 MED ORDER — HYDROMORPHONE HCL 1 MG/ML IJ SOLN: 0.2500 mg | INTRAMUSCULAR | Status: DC | PRN

## 2023-04-22 MED ORDER — LACTATED RINGERS IV SOLN
INTRAVENOUS | Status: DC
Start: 2023-04-22 — End: 2023-04-22

## 2023-04-22 MED ORDER — SCOPOLAMINE 1 MG/3DAYS TD PT72
1.0000 | MEDICATED_PATCH | TRANSDERMAL | Status: DC
  Administered 2023-04-22: 1.5 mg via TRANSDERMAL

## 2023-04-22 MED ORDER — PROPOFOL 10 MG/ML IV BOLUS
INTRAVENOUS | Status: DC | PRN
  Administered 2023-04-22: 120 mg via INTRAVENOUS

## 2023-04-22 MED ORDER — PHENYLEPHRINE 80 MCG/ML (10ML) SYRINGE FOR IV PUSH (FOR BLOOD PRESSURE SUPPORT)
PREFILLED_SYRINGE | INTRAVENOUS | Status: AC
  Filled 2023-04-22: qty 10

## 2023-04-22 MED ORDER — FENTANYL CITRATE (PF) 100 MCG/2ML IJ SOLN
INTRAMUSCULAR | Status: DC | PRN
  Administered 2023-04-22: 100 ug via INTRAVENOUS
  Administered 2023-04-22: 50 ug via INTRAVENOUS

## 2023-04-22 MED ORDER — MIDAZOLAM HCL 2 MG/2ML IJ SOLN
INTRAMUSCULAR | Status: AC
  Filled 2023-04-22: qty 2

## 2023-04-22 MED ORDER — MIDAZOLAM HCL 5 MG/5ML IJ SOLN
INTRAMUSCULAR | Status: DC | PRN
  Administered 2023-04-22: 2 mg via INTRAVENOUS

## 2023-04-22 MED ORDER — EPHEDRINE 5 MG/ML INJ
INTRAVENOUS | Status: AC
  Filled 2023-04-22: qty 5

## 2023-04-22 MED ORDER — FENTANYL CITRATE (PF) 100 MCG/2ML IJ SOLN
INTRAMUSCULAR | Status: AC
  Filled 2023-04-22: qty 2

## 2023-04-22 MED ORDER — CEFAZOLIN SODIUM-DEXTROSE 2-4 GM/100ML-% IV SOLN
INTRAVENOUS | Status: AC
  Filled 2023-04-22: qty 100

## 2023-04-22 MED ORDER — LIDOCAINE 2% (20 MG/ML) 5 ML SYRINGE
INTRAMUSCULAR | Status: AC
  Filled 2023-04-22: qty 5

## 2023-04-22 MED ORDER — LIDOCAINE 2% (20 MG/ML) 5 ML SYRINGE
INTRAMUSCULAR | Status: DC | PRN
  Administered 2023-04-22: 40 mg via INTRAVENOUS

## 2023-04-22 MED ORDER — OXYCODONE HCL 5 MG PO TABS: 5.0000 mg | ORAL_TABLET | Freq: Once | ORAL | Status: DC | PRN

## 2023-04-22 MED ORDER — ONDANSETRON HCL 4 MG/2ML IJ SOLN
INTRAMUSCULAR | Status: AC
  Filled 2023-04-22: qty 2

## 2023-04-22 MED ORDER — SUCCINYLCHOLINE CHLORIDE 200 MG/10ML IV SOSY
PREFILLED_SYRINGE | INTRAVENOUS | Status: AC
  Filled 2023-04-22: qty 10

## 2023-04-22 MED ORDER — GABAPENTIN 300 MG PO CAPS
300.0000 mg | ORAL_CAPSULE | Freq: Once | ORAL | Status: AC
  Administered 2023-04-22: 300 mg via ORAL

## 2023-04-22 MED ORDER — GABAPENTIN 300 MG PO CAPS
ORAL_CAPSULE | ORAL | Status: AC
  Filled 2023-04-22: qty 1

## 2023-04-22 MED ORDER — PROPOFOL 500 MG/50ML IV EMUL
INTRAVENOUS | Status: DC | PRN
  Administered 2023-04-22: 200 ug/kg/min via INTRAVENOUS

## 2023-04-22 MED ORDER — DEXMEDETOMIDINE HCL IN NACL 80 MCG/20ML IV SOLN
INTRAVENOUS | Status: DC | PRN
  Administered 2023-04-22: 8 ug via INTRAVENOUS

## 2023-04-22 MED ORDER — SODIUM CHLORIDE 0.9 % IV SOLN
INTRAVENOUS | Status: AC
  Filled 2023-04-22: qty 10

## 2023-04-22 SURGICAL SUPPLY — 67 items
BAG DECANTER FOR FLEXI CONT (MISCELLANEOUS) ×2 IMPLANT
BENZOIN TINCTURE PRP APPL 2/3 (GAUZE/BANDAGES/DRESSINGS) IMPLANT
BINDER BREAST LRG (GAUZE/BANDAGES/DRESSINGS) IMPLANT
BIOPATCH RED 1 DISK 7.0 (GAUZE/BANDAGES/DRESSINGS) IMPLANT
BLADE SURG 10 STRL SS (BLADE) ×2 IMPLANT
BLADE SURG 15 STRL LF DISP TIS (BLADE) IMPLANT
BNDG ELASTIC 6X10 VLCR STRL LF (GAUZE/BANDAGES/DRESSINGS) IMPLANT
CANISTER SUCT 1200ML W/VALVE (MISCELLANEOUS) ×2 IMPLANT
CHLORAPREP W/TINT 26 (MISCELLANEOUS) ×4 IMPLANT
COVER BACK TABLE 60X90IN (DRAPES) ×2 IMPLANT
COVER MAYO STAND STRL (DRAPES) ×2 IMPLANT
DRAIN CHANNEL 15F RND FF W/TCR (WOUND CARE) IMPLANT
DRAPE LAPAROSCOPIC ABDOMINAL (DRAPES) ×2 IMPLANT
DRAPE UTILITY XL STRL (DRAPES) ×2 IMPLANT
DRSG TEGADERM 2-3/8X2-3/4 SM (GAUZE/BANDAGES/DRESSINGS) IMPLANT
ELECT BLADE 4.0 EZ CLEAN MEGAD (MISCELLANEOUS)
ELECT COATED BLADE 2.86 ST (ELECTRODE) IMPLANT
ELECT REM PT RETURN 9FT ADLT (ELECTROSURGICAL) ×1
ELECTRODE BLDE 4.0 EZ CLN MEGD (MISCELLANEOUS) IMPLANT
ELECTRODE REM PT RTRN 9FT ADLT (ELECTROSURGICAL) ×2 IMPLANT
EVACUATOR SILICONE 100CC (DRAIN) IMPLANT
FUNNEL KELLER 2 DISP (MISCELLANEOUS) IMPLANT
GAUZE PAD ABD 8X10 STRL (GAUZE/BANDAGES/DRESSINGS) ×4 IMPLANT
GAUZE SPONGE 4X4 12PLY STRL (GAUZE/BANDAGES/DRESSINGS) IMPLANT
GLOVE BIO SURGEON STRL SZ 6.5 (GLOVE) IMPLANT
GLOVE BIOGEL M STRL SZ7.5 (GLOVE) ×2 IMPLANT
GLOVE BIOGEL PI IND STRL 7.0 (GLOVE) IMPLANT
GLOVE BIOGEL PI IND STRL 8 (GLOVE) IMPLANT
GLOVE SURG SS PI 6.5 STRL IVOR (GLOVE) IMPLANT
GOWN STRL REUS W/ TWL LRG LVL3 (GOWN DISPOSABLE) ×6 IMPLANT
ILLUMINATOR WAVEGUIDE N/F (MISCELLANEOUS) IMPLANT
IV NS 500ML BAXH (IV SOLUTION) IMPLANT
KIT FILL ASEPTIC TRANSFER (MISCELLANEOUS) IMPLANT
MARKER SKIN DUAL TIP RULER LAB (MISCELLANEOUS) IMPLANT
NDL HYPO 25X1 1.5 SAFETY (NEEDLE) IMPLANT
NEEDLE HYPO 25X1 1.5 SAFETY (NEEDLE) ×1
PACK BASIN DAY SURGERY FS (CUSTOM PROCEDURE TRAY) ×2 IMPLANT
PACK SPY-PHI (KITS) IMPLANT
PENCIL SMOKE EVACUATOR (MISCELLANEOUS) ×2 IMPLANT
PIN SAFETY STERILE (MISCELLANEOUS) ×2 IMPLANT
RETRACTOR ONETRAX LX 135X30 (MISCELLANEOUS) IMPLANT
SHEET MEDIUM DRAPE 40X70 STRL (DRAPES) ×4 IMPLANT
SLEEVE SCD COMPRESS KNEE MED (STOCKING) ×2 IMPLANT
SPIKE FLUID TRANSFER (MISCELLANEOUS) IMPLANT
SPONGE T-LAP 18X18 ~~LOC~~+RFID (SPONGE) ×4 IMPLANT
STAPLER INSORB 30 2030 C-SECTI (MISCELLANEOUS) IMPLANT
STAPLER SKIN PROX WIDE 3.9 (STAPLE) ×2 IMPLANT
STRIP CLOSURE SKIN 1/2X4 (GAUZE/BANDAGES/DRESSINGS) ×4 IMPLANT
SUT ETHILON 2 0 FS 18 (SUTURE) IMPLANT
SUT MON AB 3-0 SH27 (SUTURE) IMPLANT
SUT MON AB 4-0 PC3 18 (SUTURE) IMPLANT
SUT MON AB 5-0 PS2 18 (SUTURE) IMPLANT
SUT PDS 3-0 CT2 (SUTURE) ×2
SUT PDS II 3-0 CT2 27 ABS (SUTURE) IMPLANT
SUT STRATA 3-0 60 PS-1 (SUTURE) IMPLANT
SUT STRATAFIX 0 PDS 27 VIOLET (SUTURE)
SUT VICRYL RAPIDE 4-0 (SUTURE) IMPLANT
SUTURE STRATFX 0 PDS 27 VIOLET (SUTURE) IMPLANT
SYR 10ML LL (SYRINGE) IMPLANT
SYR BULB IRRIG 60ML STRL (SYRINGE) ×2 IMPLANT
SYR CONTROL 10ML LL (SYRINGE) IMPLANT
TAPE MEASURE VINYL STERILE (MISCELLANEOUS) IMPLANT
TOWEL GREEN STERILE FF (TOWEL DISPOSABLE) ×4 IMPLANT
TRAY FOLEY W/BAG SLVR 14FR LF (SET/KITS/TRAYS/PACK) IMPLANT
TUBE CONNECTING 20X1/4 (TUBING) ×2 IMPLANT
UNDERPAD 30X36 HEAVY ABSORB (UNDERPADS AND DIAPERS) ×4 IMPLANT
YANKAUER SUCT BULB TIP NO VENT (SUCTIONS) ×2 IMPLANT

## 2023-04-22 NOTE — Op Note (Addendum)
 Operative Note   DATE OF OPERATION: 04/22/2023  SURGICAL DEPARTMENT: Plastic Surgery  LOCATION: [facility]  PREOPERATIVE DIAGNOSES: Bilateral mastectomy flap necrosis  POSTOPERATIVE DIAGNOSES:  same  PROCEDURE: 1.  Debridement of full-thickness left nipple necrosis with complex closure 2.  Removal of right breast tissue expander and acellular dermal matrix 3.  Debridement of full-thickness right mastectomy flap necrosis with complex closure 4.  Indocyanine green angiography bilateral mastectomy flaps  SURGEON: Craig Lorren Shank, MD  ASSISTANT: None  ANESTHESIA:  General.   COMPLICATIONS: None.   INDICATIONS FOR PROCEDURE:  The patient, [name] is a 48 y.o. female born on [birth year], is here for treatment of lateral mastectomy flap necrosis MRN: [id number]  CONSENT:  Informed consent was obtained directly from the patient. Risks, benefits and alternatives were fully discussed. Specific risks including but not limited to bleeding, infection, hematoma, seroma, scarring, pain, contracture, asymmetry, wound healing problems, and need for further surgery were all discussed. The patient did have an ample opportunity to have questions answered to satisfaction.   DESCRIPTION OF PROCEDURE:  The patient was taken to the operating room. SCDs were placed and antibiotics were given.  General anesthesia was administered.  The patient's operative site was prepped and draped in a sterile fashion. A time out was performed and all information was confirmed to be correct.  Both drains were removed as there had been minimal output over the past several days.  I started by closely examining the skin flaps.  On the left side the nipple itself appeared to have full-thickness necrosis.  I incised down the central portion and this did not appear to be bleeding.  I elected to remove the area that was demarcated and this resulted in a full-thickness excision that encompassed approximately 4  x 2 cm.  The surrounding skin was then advanced and closed in layers with buried PDS and running 5-0 Monocryl suture.  Total length of closure 4 cm.  On examination of the right breast there was clear full-thickness necrosis of the nipple itself in addition to some areas inferiorly.  There was a full-thickness perforation in the skin along the inferior necrosis where the implant was visible with minimal manipulation.  I drew out the required excision of the nipple and the area along the inframammary crease and there only remained a thin skin bridge between the 2 that I did not suspect would be viable after excision.  At this point I removed the inferior ellipse along the inframammary incision full-thickness.  I was then able to examine the underside of the nipple which was clearly nonviable based on color and then proceeded to do the rest of the excision and 1 large area.  Total area of debridement which was done with a 15 blade encompassed 7 x 7 cm.  I then removed the tissue expander along with the surrounding acellular dermal matrix.  There was minimal fluid present in the pocket on entering and no hematoma of significance.  I achieved meticulous hemostasis in the pocket.  A new 15 French drain was placed in the right side and secured with a nylon suture.  Pocket was irrigated with triple antibiotic solution.  I then proceeded to gradually closed the skin.  There was some tension on it and it would not have been possible to replace the tissue expander and still close the wound.  Closure was done in layers with 3-0 PDS buried sutures and running 5-0 Monocryl.  Closure was performed in a T shape  with the vertical portion being 5 cm in the inferior portion being 7 cm.  I did use the SPY system after closure to confirm that all remaining tissue had good perfusion.  Soft dressing was applied.  The patient tolerated the procedure well.  There were no complications. The patient was allowed to wake from anesthesia,  extubated and taken to the recovery room in satisfactory condition.

## 2023-04-22 NOTE — Anesthesia Procedure Notes (Signed)
 Procedure Name: LMA Insertion Date/Time: 04/22/2023 2:42 PM  Performed by: Emilio Rock BIRCH, CRNAPre-anesthesia Checklist: Patient identified, Emergency Drugs available, Suction available and Patient being monitored Patient Re-evaluated:Patient Re-evaluated prior to induction Oxygen Delivery Method: Circle System Utilized Preoxygenation: Pre-oxygenation with 100% oxygen Induction Type: IV induction Ventilation: Mask ventilation without difficulty LMA: LMA inserted LMA Size: 4.0 Number of attempts: 1 Airway Equipment and Method: bite block Placement Confirmation: positive ETCO2 Tube secured with: Tape Dental Injury: Teeth and Oropharynx as per pre-operative assessment

## 2023-04-22 NOTE — H&P (Signed)
 Reason for Consult/CC: Mastectomy flap necrosis  [name] is an 48 y.o. female.  HPI: Patient presents with at least superficial bilateral mastectomy flap necrosis.  The right side is worse and were planning for debridement and possible exchange of the right tissue expander  Allergies: No Known Allergies  Medications:  Current Facility-Administered Medications:    [START ON 04/23/2023] ceFAZolin  (ANCEF ) IVPB 2g/100 mL premix, 2 g, Intravenous, On Call to OR, Jaeda Bruso, Craig RAMAN, MD   lactated ringers  infusion, , Intravenous, Continuous, Darlyn Rush, MD   scopolamine  (TRANSDERM-SCOP) 1 MG/3DAYS 1.5 mg, 1 patch, Transdermal, Q72H, Darlyn Rush, MD, 1.5 mg at 04/22/23 1245  Past Medical History:  Diagnosis Date   Anxiety 2011   BRCA1 gene mutation positive    dx 01/ 2020   Encounter for weight management 07/12/2022   Family history of breast cancer    Gestational diabetes mellitus, class A2 06/17/2014   Gestational thrombocytopenia (HCC) 06/17/2014   Hemorrhoids    History of gestational diabetes    Hypothyroidism    followed by dr gorge   Mixed hyperlipidemia 04/02/2022    Past Surgical History:  Procedure Laterality Date   BREAST RECONSTRUCTION WITH PLACEMENT OF TISSUE EXPANDER AND FLEX HD (ACELLULAR HYDRATED DERMIS) Bilateral 04/06/2023   Procedure: BILATERAL BREAST RECONSTRUCTION WITH PLACEMENT OF TISSUE EXPANDER AND FLEX HD (ACELLULAR HYDRATED DERMIS);  Surgeon: Elisabeth Craig RAMAN, MD;  Location: [facility];  Service: Plastics;  Laterality: Bilateral;   CESAREAN SECTION  2007 & 2010   x 2 - at [facility]   CESAREAN SECTION WITH BILATERAL TUBAL LIGATION Bilateral 06/15/2014   Procedure: REPEAT CESAREAN SECTION WITH BILATERAL TUBAL LIGATION;  Surgeon: Charlie Gorge, MD;  Location: [facility];  Service: Obstetrics;  Laterality: Bilateral;  EDD: 06/19/14   COLONOSCOPY WITH PROPOFOL   08/31/2012   NIPPLE SPARING MASTECTOMY Bilateral 04/06/2023   Procedure: BILATERAL  NIPPLE SPARING MASTECTOMY;  Surgeon: Ebbie Cough, MD;  Location:  SURGERY CENTER;  Service: General;  Laterality: Bilateral;  PEC BLOCK   ROBOTIC ASSISTED SALPINGO OOPHERECTOMY Right 10/30/2018   Procedure: XI ROBOTIC ASSISTED SALPINGO OOPHORECTOMY/With Lysis of Adhesions;  Surgeon: Gorge Charlie, MD;  Location: [facility];  Service: Gynecology;  Laterality: Right;  Requests 2 hrs.   TRIGGER FINGER RELEASE Left    TUBAL LIGATION  2016   wisdom teeth ext  teen    Family History  Problem Relation Age of Onset   Diabetes Mother    Breast cancer Mother        dx 74 and 57, BRCA1+   Colon polyps Mother    Fibromyalgia Mother    Cancer Mother    Obesity Mother    Varicose Veins Mother    Kidney disease Father    Heart disease Father    Hypertension Father    Alcohol abuse Father    Obesity Father    Stroke Father    Diabetes Sister    Obesity Sister    Cancer Maternal Grandmother        breast dx 20s, d. 77s   Breast cancer Paternal Aunt        dx 73s   Leukemia Cousin        dx childhood    Social History:  reports that she has never smoked. She has never used smokeless tobacco. She reports that she does not currently use alcohol. She reports that she does not use drugs.  Physical Exam Blood pressure 113/70, pulse 72, temperature 98.6 F (  37 C), temperature source Temporal, resp. rate 16, height 5' 2 (1.575 m), weight 59.1 kg, SpO2 100%. General: Well-developed well-nourished no acute distress Notable bilateral nipple necrosis.  Left side is smaller and potentially superficial.  Right side appears larger and full-thickness.  There is also some full-thickness injury inferiorly on the right side.  No results found for this or any previous visit (from the past 48 hours).  No results found.  Assessment/Plan: Patient needs debridement of the mastectomy flaps to salvage the reconstruction.  My intention is to exchange her expander bander given  the potential for contamination on the right side.  I will likely use the spy angiography to help determine what to do with the left nipple.  If it fails on spy will assume that its superficial and leave it in place.  I discussed all this with the patient and her husband ahead of time.  I we also discussed the potential for not being able to place a tissue expander on the right side if there is too much tension on the skin.  Were all in agreement with moving forward.  Elisabeth GORMAN Coombs 04/22/2023, 2:35 PM

## 2023-04-22 NOTE — Transfer of Care (Signed)
 Immediate Anesthesia Transfer of Care Note  Patient: [name]  Procedure(s) Performed: REVISION OF BREAST MASTECTOMY WITH DEBRIDMENT AND REMOVAL OF RIGHT TISSUE EXPANDER AND FLEX HD (ACELLULAR HYDRATED DERMIS) (Bilateral: Breast)  Patient Location: PACU  Anesthesia Type:General  Level of Consciousness: awake, drowsy, and patient cooperative  Airway & Oxygen Therapy: Patient Spontanous Breathing and Patient connected to face mask oxygen  Post-op Assessment: Report given to RN and Post -op Vital signs reviewed and stable  Post vital signs: Reviewed and stable  Last Vitals:  Vitals Value Taken Time  BP    Temp    Pulse    Resp    SpO2      Last Pain:  Vitals:   04/22/23 1222  TempSrc: Temporal  PainSc: 0-No pain      Patients Stated Pain Goal: 3 (04/22/23 1222)  Complications: No notable events documented.

## 2023-04-22 NOTE — Discharge Instructions (Addendum)
 Leave dressing for 2 days.  You may then remove and shower.  Avoid strenuous activity.  Empty and record right JP drain output and bring to your next visit.     Post Anesthesia Home Care Instructions  Activity: Get plenty of rest for the remainder of the day. A responsible individual must stay with you for 24 hours following the procedure.  For the next 24 hours, DO NOT: -Drive a car -Advertising copywriter -Drink alcoholic beverages -Take any medication unless instructed by your physician -Make any legal decisions or sign important papers.  Meals: Start with liquid foods such as gelatin or soup. Progress to regular foods as tolerated. Avoid greasy, spicy, heavy foods. If nausea and/or vomiting occur, drink only clear liquids until the nausea and/or vomiting subsides. Call your physician if vomiting continues.  Special Instructions/Symptoms: Your throat may feel dry or sore from the anesthesia or the breathing tube placed in your throat during surgery. If this causes discomfort, gargle with warm salt water. The discomfort should disappear within 24 hours.  If you had a scopolamine  patch placed behind your ear for the management of post- operative nausea and/or vomiting:  1. The medication in the patch is effective for 72 hours, after which it should be removed.  Wrap patch in a tissue and discard in the trash. Wash hands thoroughly with soap and water. 2. You may remove the patch earlier than 72 hours if you experience unpleasant side effects which may include dry mouth, dizziness or visual disturbances. 3. Avoid touching the patch. Wash your hands with soap and water after contact with the patch.     About my Jackson-Pratt Bulb Drain  What is a Jackson-Pratt bulb? A Jackson-Pratt is a soft, round device used to collect drainage. It is connected to a long, thin drainage catheter, which is held in place by one or two small stiches near your surgical incision site. When the bulb is squeezed,  it forms a vacuum, forcing the drainage to empty into the bulb.  Emptying the Jackson-Pratt bulb- To empty the bulb: 1. Release the plug on the top of the bulb. 2. Pour the bulb's contents into a measuring container which your nurse will provide. 3. Record the time emptied and amount of drainage. Empty the drain(s) as often as your     doctor or nurse recommends.  Date                  Time                    Amount (Drain 1)                 Amount (Drain 2)  _____________________________________________________________________  _____________________________________________________________________  _____________________________________________________________________  _____________________________________________________________________  _____________________________________________________________________  _____________________________________________________________________  _____________________________________________________________________  _____________________________________________________________________  Squeezing the Jackson-Pratt Bulb- To squeeze the bulb: 1. Make sure the plug at the top of the bulb is open. 2. Squeeze the bulb tightly in your fist. You will hear air squeezing from the bulb. 3. Replace the plug while the bulb is squeezed. 4. Use a safety pin to attach the bulb to your clothing. This will keep the catheter from     pulling at the bulb insertion site.  When to call your doctor- Call your doctor if: Drain site becomes red, swollen or hot. You have a fever greater than 101 degrees F. There is oozing at the drain site. Drain falls out (apply a guaze bandage over the drain hole and secure  it with tape). Drainage increases daily not related to activity patterns. (You will usually have more drainage when you are active than when you are resting.) Drainage has a bad odor.    Next dose of Tylenol  may be given at 6:30pm if needed.

## 2023-04-22 NOTE — Interval H&P Note (Signed)
 History and Physical Interval Note:  04/22/2023 2:37 PM  [name]  has presented today for surgery, with the diagnosis of MASTECTOMY REVISION BILATERAL.  The various methods of treatment have been discussed with the patient and family. After consideration of risks, benefits and other options for treatment, the patient has consented to  Procedure(s): REVISION OF BREAST MASTECTOMY WITH DEBRIDMENT AND POSSIBLE EXCHANGE OF TISSUE EXPANDER AND FLEX HD (ACELLULAR HYDRATED DERMIS) (Bilateral) as a surgical intervention.  The patient's history has been reviewed, patient examined, no change in status, stable for surgery.  I have reviewed the patient's chart and labs.  Questions were answered to the patient's satisfaction.     Elisabeth GORMAN Coombs

## 2023-04-23 ENCOUNTER — Other Ambulatory Visit: Payer: Self-pay | Admitting: Physician Assistant

## 2023-04-23 NOTE — Anesthesia Postprocedure Evaluation (Signed)
 Anesthesia Post Note  Patient: [name]  Procedure(s) Performed: REVISION OF BREAST MASTECTOMY WITH DEBRIDMENT AND REMOVAL OF RIGHT TISSUE EXPANDER AND FLEX HD (ACELLULAR HYDRATED DERMIS) (Bilateral: Breast)     Patient location during evaluation: PACU Anesthesia Type: General Level of consciousness: sedated and patient cooperative Pain management: pain level controlled Vital Signs Assessment: post-procedure vital signs reviewed and stable Respiratory status: spontaneous breathing Cardiovascular status: stable Anesthetic complications: no   No notable events documented.  Last Vitals:  Vitals:   04/22/23 1715 04/22/23 1756  BP: 134/74 116/61  Pulse: 65 68  Resp: 12 14  Temp:  (!) 36.2 C  SpO2: 98% 95%    Last Pain:  Vitals:   04/22/23 1756  TempSrc:   PainSc: 0-No pain                 Norleen Pope

## 2023-04-25 ENCOUNTER — Encounter (HOSPITAL_BASED_OUTPATIENT_CLINIC_OR_DEPARTMENT_OTHER): Payer: Self-pay | Admitting: Plastic Surgery

## 2023-04-26 LAB — SURGICAL PATHOLOGY

## 2023-04-29 DIAGNOSIS — Z853 Personal history of malignant neoplasm of breast: Secondary | ICD-10-CM | POA: Diagnosis not present

## 2023-05-05 ENCOUNTER — Telehealth: Payer: Self-pay

## 2023-05-05 ENCOUNTER — Encounter: Payer: Self-pay | Admitting: Hematology and Oncology

## 2023-05-05 NOTE — Telephone Encounter (Signed)
Called pt to discuss MyChart message. She was born with only right ovary. She has had oophorectomy and now double mastectomy. She is asking about hormone replacement therapy. Advised pt this would be a discussion she needed to have with Dr Al Pimple. She was originally scheduled to see Dr Al Pimple in July, but we r/s this to be seen 05-19-23.

## 2023-05-09 ENCOUNTER — Encounter: Payer: Self-pay | Admitting: Physician Assistant

## 2023-05-10 NOTE — Telephone Encounter (Signed)
 Please contact patient and schedule for quick VV to discuss Rx

## 2023-05-16 ENCOUNTER — Encounter: Payer: Self-pay | Admitting: Physician Assistant

## 2023-05-16 ENCOUNTER — Telehealth (INDEPENDENT_AMBULATORY_CARE_PROVIDER_SITE_OTHER): Payer: BC Managed Care – PPO | Admitting: Physician Assistant

## 2023-05-16 DIAGNOSIS — Z9889 Other specified postprocedural states: Secondary | ICD-10-CM | POA: Diagnosis not present

## 2023-05-16 DIAGNOSIS — Z7689 Persons encountering health services in other specified circumstances: Secondary | ICD-10-CM

## 2023-05-16 DIAGNOSIS — L709 Acne, unspecified: Secondary | ICD-10-CM

## 2023-05-16 MED ORDER — TRETINOIN 0.05 % EX CREA: TOPICAL_CREAM | Freq: Every day | CUTANEOUS | 3 refills | Status: DC

## 2023-05-16 NOTE — Progress Notes (Signed)
 Virtual Visit via Video Note  I connected with  [name]  on 05/16/23 at 11:00 AM EST by a video enabled telemedicine application and verified that I am speaking with the correct person using two identifiers.  Location: Patient: home Provider: Nature conservation officer at [employer] Persons present: Patient and myself   I discussed the limitations of evaluation and management by telemedicine and the availability of in person appointments. The patient expressed understanding and agreed to proceed.   History of Present Illness:    History of Present Illness   [name] is a 48 year old female who presents for follow-up after complications from a bilateral mastectomy.  She underwent a bilateral mastectomy and returned home the following day. By Saturday, she noticed discoloration and abnormal appearance of the surgical site, prompting her to contact her plastic surgeon. Despite initial reassurances, she began applying nitroglycerin cream three times daily to improve blood flow. By the following Monday, her surgeon remained hopeful for improvement, but she developed a fever by Thursday, leading to concerns about the need for additional surgery. The following Monday, it was determined that she required another surgery due to necrosis and tissue breakdown, particularly under the right breast where sores developed. During the surgery, the right tissue expander was removed, and there was insufficient tissue to place a new expander. Consequently, she was left without nipples and only a tissue expander on the left side. She is currently waiting three to six months for the right side to heal before another tissue expander can be placed. She describes the process of tissue expansion and the need for additional surgeries, which has increased from three to at least five procedures.  She reports significant mental distress due to the deviation from the expected surgical plan.  Post-surgery, she experienced physical improvement with minimal pain and began healing well. She reports doing much better emotionally at this time.  She has been using tretinoin cream 0.025% for two years for adult acne and fine lines, without experiencing peeling or redness. She is interested in trying a higher concentration of 0.05% due to her skin's tolerance.  She also resumed Zepbound after a surgical hiatus, having been off it for approximately six weeks due to surgical requirements and the need to increase her nutritional intake post-surgery. She engages in regular physical activity, such as walking, and has returned to work, which is beneficial for her mental and physical health.        Observations/Objective:   Gen: Awake, alert, no acute distress Resp: Breathing is even and non-labored Psych: calm/pleasant demeanor Neuro: Alert and Oriented x 3, + facial symmetry, speech is clear.   Assessment and Plan:  Assessment and Plan    Post-mastectomy complications Underwent bilateral mastectomy with necrosis and tissue breakdown, requiring removal of right tissue expander. Healing with left expander in place. Plans for right expander placement after 3 to 6 months. Aware of extended treatment course. - Continue healing for 3 to 6 months before new right expander placement. - Follow up with plastic surgeon in early April for healing assessment and potential May surgery. - Consider delaying surgery until mid-June if necessary due to son's graduation.  Obesity On Zepbound for weight management with approved prior authorization. Resumed medication post-surgery. - Continue Zepbound for weight management.  Adult acne Using tretinoin cream 0.025% for two years without adverse effects. Requested higher concentration for improved results. - Prescribe tretinoin cream 0.05% and monitor for adverse effects. - Advise starting with  every other night application to assess tolerance.         Follow Up Instructions:    I discussed the assessment and treatment plan with the patient. The patient was provided an opportunity to ask questions and all were answered. The patient agreed with the plan and demonstrated an understanding of the instructions.   The patient was advised to call back or seek an in-person evaluation if the symptoms worsen or if the condition fails to improve as anticipated.  Joyleen Haselton M Larri Yehle, PA-C

## 2023-05-19 ENCOUNTER — Telehealth: Payer: Self-pay | Admitting: Pharmacy Technician

## 2023-05-19 ENCOUNTER — Inpatient Hospital Stay: Payer: BC Managed Care – PPO | Attending: Hematology and Oncology | Admitting: Hematology and Oncology

## 2023-05-19 ENCOUNTER — Other Ambulatory Visit (HOSPITAL_COMMUNITY): Payer: Self-pay

## 2023-05-19 VITALS — BP 122/86 | HR 70 | Temp 97.8°F | Resp 16 | Wt 127.5 lb

## 2023-05-19 DIAGNOSIS — Z803 Family history of malignant neoplasm of breast: Secondary | ICD-10-CM | POA: Diagnosis not present

## 2023-05-19 DIAGNOSIS — Z1502 Genetic susceptibility to malignant neoplasm of ovary: Secondary | ICD-10-CM | POA: Diagnosis not present

## 2023-05-19 DIAGNOSIS — E039 Hypothyroidism, unspecified: Secondary | ICD-10-CM | POA: Insufficient documentation

## 2023-05-19 DIAGNOSIS — Z1509 Genetic susceptibility to other malignant neoplasm: Secondary | ICD-10-CM | POA: Insufficient documentation

## 2023-05-19 DIAGNOSIS — R1905 Periumbilic swelling, mass or lump: Secondary | ICD-10-CM | POA: Diagnosis not present

## 2023-05-19 DIAGNOSIS — Z9013 Acquired absence of bilateral breasts and nipples: Secondary | ICD-10-CM | POA: Insufficient documentation

## 2023-05-19 DIAGNOSIS — Z1501 Genetic susceptibility to malignant neoplasm of breast: Secondary | ICD-10-CM | POA: Diagnosis not present

## 2023-05-19 DIAGNOSIS — R634 Abnormal weight loss: Secondary | ICD-10-CM | POA: Insufficient documentation

## 2023-05-19 DIAGNOSIS — Z806 Family history of leukemia: Secondary | ICD-10-CM | POA: Insufficient documentation

## 2023-05-19 DIAGNOSIS — Z90722 Acquired absence of ovaries, bilateral: Secondary | ICD-10-CM | POA: Insufficient documentation

## 2023-05-19 DIAGNOSIS — R1903 Right lower quadrant abdominal swelling, mass and lump: Secondary | ICD-10-CM | POA: Diagnosis not present

## 2023-05-19 DIAGNOSIS — E785 Hyperlipidemia, unspecified: Secondary | ICD-10-CM | POA: Insufficient documentation

## 2023-05-19 DIAGNOSIS — Z79899 Other long term (current) drug therapy: Secondary | ICD-10-CM | POA: Insufficient documentation

## 2023-05-19 DIAGNOSIS — Z79890 Hormone replacement therapy (postmenopausal): Secondary | ICD-10-CM | POA: Diagnosis not present

## 2023-05-19 DIAGNOSIS — E8941 Symptomatic postprocedural ovarian failure: Secondary | ICD-10-CM | POA: Diagnosis not present

## 2023-05-19 DIAGNOSIS — Z0189 Encounter for other specified special examinations: Secondary | ICD-10-CM | POA: Diagnosis not present

## 2023-05-19 DIAGNOSIS — Z0389 Encounter for observation for other suspected diseases and conditions ruled out: Secondary | ICD-10-CM | POA: Diagnosis not present

## 2023-05-19 NOTE — Telephone Encounter (Signed)
 Pharmacy Patient Advocate Encounter   Received notification from Patient Pharmacy that prior authorization for TRETINOIN 0.05% CREAM is required/requested.   Insurance verification completed.   The patient is insured through [facility] .   Per test claim: PA required; PA submitted to above mentioned insurance via CoverMyMeds Key/confirmation #/EOC [id number] Status is pending

## 2023-05-19 NOTE — Telephone Encounter (Signed)
 Pharmacy Patient Advocate Encounter  Received notification from [facility] that Prior Authorization for TRETINOIN 0.05% CREAM has been APPROVED from 05/19/2023 to 05/18/2026. Ran test claim, Copay is $12.98. This test claim was processed through [facility]- copay amounts may vary at other pharmacies due to pharmacy/plan contracts, or as the patient moves through the different stages of their insurance plan.   PA #/Case ID/Reference #: [id number]

## 2023-05-19 NOTE — Progress Notes (Signed)
 [facility]  [phone] [phone]    ID: [name] DOB: 1975/05/25  MR#: [id number]  JYN#:[id number]  Patient Care Team: Allwardt, Crist Infante, PA-C as PCP - General (Physician Assistant) Randolph, Michelle Hue, MD (Inactive) as Consulting Physician (Oncology) Michelle Fellows, MD as Consulting Physician (Plastic Surgery) Michelle Fender, MD as Attending Physician (Radiology) OTHER MD:   CHIEF COMPLAINT: BRCA1 positive  CURRENT TREATMENT: Intensified screening   INTERVAL HISTORY:  [name] returns today for follow up of her BRCA1 mutation/breast cancer risk.   HISTORY OF CURRENT ILLNESS: From the original intake note:  [name] is a 48 year old female with BRCA1 mutation who presents for follow-up after bilateral mastectomy and complications.  She underwent a nipple-sparing mastectomy on April 06, 2023, with tissue expanders placed. Initially, the postoperative course was uneventful, but she soon noticed discoloration of her nipples and developed a fever. Her condition worsened with open sores and necrotic tissue, necessitating a second surgery to remove both nipples and the right tissue expander. Currently, she has a tissue expander on the left side, which has healed well, while the right side is still healing. She describes the sensation of the tissue expander as 'like having a Tupperware bowl under my skin.'  She has a BRCA1 mutation, which led to her decision to undergo a prophylactic mastectomy to reduce her high risk of breast cancer. She has also had a salpingo-oophorectomy. No pain post-surgery and she has resumed her normal activities. She maintains a healthy lifestyle, including regular exercise and a balanced diet, and does not smoke or drink alcohol.  She has lost 105 pounds since the beginning of 2021 through diet, exercise, and the use of Mounjaro (Zepbound) for weight management. She is currently on a maintenance dose of  Zepbound, which she takes less frequently than weekly.  She has undergone primary prevention for breast and ovarian cancer. She is interested in considering HRT and is here to discuss this.  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   PAST SURGICAL HISTORY: Past Surgical History:  Procedure Laterality Date   BREAST RECONSTRUCTION WITH PLACEMENT OF TISSUE EXPANDER AND FLEX HD (ACELLULAR HYDRATED DERMIS) Bilateral 04/06/2023   Procedure: BILATERAL BREAST RECONSTRUCTION WITH PLACEMENT OF TISSUE EXPANDER AND FLEX HD (ACELLULAR HYDRATED DERMIS);  Surgeon: Michelle Napoleon, MD;  Location: [facility];  Service: Plastics;  Laterality: Bilateral;   BREAST RECONSTRUCTION WITH PLACEMENT OF TISSUE EXPANDER AND FLEX HD (ACELLULAR HYDRATED DERMIS) Bilateral 04/22/2023   Procedure: REVISION OF BREAST MASTECTOMY WITH DEBRIDMENT AND REMOVAL OF RIGHT TISSUE EXPANDER AND FLEX HD (ACELLULAR HYDRATED DERMIS);  Surgeon: Michelle Napoleon, MD;  Location: [facility];  Service: Plastics;  Laterality: Bilateral;   CESAREAN SECTION  2007 & 2010   x 2 - at [facility]   CESAREAN SECTION WITH BILATERAL TUBAL LIGATION Bilateral 06/15/2014   Procedure: REPEAT CESAREAN SECTION WITH BILATERAL TUBAL LIGATION;  Surgeon: Michelle Mackie, MD;  Location: [facility];  Service: Obstetrics;  Laterality: Bilateral;  EDD:  06/19/14   COLONOSCOPY WITH PROPOFOL  08/31/2012   NIPPLE SPARING MASTECTOMY Bilateral 04/06/2023   Procedure: BILATERAL NIPPLE SPARING MASTECTOMY;  Surgeon:  Michelle Loron, MD;  Location: [facility];  Service: General;  Laterality: Bilateral;  PEC BLOCK   ROBOTIC ASSISTED SALPINGO OOPHERECTOMY Right 10/30/2018   Procedure: XI ROBOTIC ASSISTED SALPINGO OOPHORECTOMY/With Lysis of Adhesions;  Surgeon: Michelle Mackie, MD;  Location: [facility];  Service: Gynecology;  Laterality: Right;  Requests 2 hrs.   TRIGGER FINGER RELEASE Left    TUBAL LIGATION  2016   wisdom teeth ext  teen    FAMILY HISTORY: Family History  Problem Relation Age of Onset   Diabetes Mother    Breast cancer Mother        dx 16 and 90, BRCA1+   Colon polyps Mother    Fibromyalgia Mother    Cancer Mother    Obesity Mother    Varicose Veins Mother    Kidney disease Father    Heart disease Father    Hypertension Father    Alcohol abuse Father    Obesity Father    Stroke Father    Diabetes Sister    Obesity Sister    Cancer Maternal Grandmother        breast dx 39s, d. 82s   Breast cancer Paternal Aunt        dx 72s   Leukemia Cousin        dx childhood   [name]'s father died from heart disease and kidney failure at age 50. Patients' mother is 70 as of 05/2018, with a history of BRCA1+ and a diagnosis of breast cancer x2. The patient has 1 sister. Patient denies anyone in her family having ovarian, prostate, or pancreatic cancer. [name]'s maternal grandmother was diagnosed with breast cancer. [name]'s maternal uncle and his daughter are also both BRCA1+.    GYNECOLOGIC HISTORY:  No LMP recorded. (Menstrual status: Oophorectomy). 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 Her husband, [name], works in Airline pilot. They have three children, ages 72 (05/2005), 9 (07/2009), and 3 (06/2015).     ADVANCED  DIRECTIVES: Her husband, [name], is her healthcare power of attorney in the absence of documents to the contrary.      HEALTH MAINTENANCE: Social History   Tobacco Use   Smoking status: Never   Smokeless tobacco: Never  Vaping Use   Vaping status: Never Used  Substance Use Topics   Alcohol use: Not Currently   Drug use: No    Colonoscopy: n/a  PAP: up to date  Bone density: n/a Mammography: [facility]. 06/2017.    No Known Allergies  Current Outpatient Medications  Medication Sig Dispense Refill   ALPRAZolam (XANAX) 0.5 MG tablet Take 1 tablet (0.5 mg total) by mouth at bedtime as needed for anxiety. 10 tablet 0   levothyroxine (SYNTHROID) 88 MCG tablet TAKE 1 TABLET BY MOUTH EVERY MORNING. 90 tablet 1   rosuvastatin (CRESTOR) 5 MG tablet Take 1 tablet (5 mg total) by mouth daily. 90 tablet 3   tirzepatide (ZEPBOUND) 5 MG/0.5ML Pen Inject 5 mg into the skin once a week.     tretinoin (RETIN-A) 0.05 % cream Apply topically at bedtime. 45 g 3   No  current facility-administered medications for this visit.    OBJECTIVE: white woman who appears well  Vitals:   05/19/23 1256  BP: 122/86  Pulse: 70  Resp: 16  Temp: 97.8 F (36.6 C)  SpO2: 100%      Body mass index is 23.32 kg/m.   Wt Readings from Last 3 Encounters:  05/19/23 127 lb 8 oz (57.8 kg)  04/22/23 130 lb 4.7 oz (59.1 kg)  04/06/23 130 lb 1.1 oz (59 kg)      ECOG FS:0 - Asymptomatic  Sclerae unicteric, EOMs intact Breast exam: Right breast healed well Left breast with expander. No LE edema.  LAB RESULTS:  CMP     Component Value Date/Time   NA 140 03/15/2023 1255   NA 139 01/19/2022 0000   K 3.8 03/15/2023 1255   CL 105 03/15/2023 1255   CO2 28 03/15/2023 1255   GLUCOSE 82 03/15/2023 1255   BUN 14 03/15/2023 1255   BUN 10 01/19/2022 0000   CREATININE 0.80 03/15/2023 1255   CALCIUM 9.4 03/15/2023 1255   PROT 7.2 03/15/2023 1255   ALBUMIN 4.3 03/15/2023 1255   AST 28 03/15/2023 1255   ALT 21  03/15/2023 1255   ALKPHOS 65 03/15/2023 1255   BILITOT 0.7 03/15/2023 1255   GFRNONAA >90 06/14/2014 1045   GFRAA >90 06/14/2014 1045    No results found for: "TOTALPROTELP", "ALBUMINELP", "A1GS", "A2GS", "BETS", "BETA2SER", "GAMS", "MSPIKE", "SPEI"  No results found for: "KPAFRELGTCHN", "LAMBDASER", "KAPLAMBRATIO"  Lab Results  Component Value Date   WBC 4.3 03/15/2023   NEUTROABS 2.8 03/15/2023   HGB 13.3 03/15/2023   HCT 39.9 03/15/2023   MCV 93.9 03/15/2023   PLT 195.0 03/15/2023   No results found for: "LABCA2"  No components found for: "WUJWJX914"  No results for input(s): "INR" in the last 168 hours.  No results found for: "LABCA2"  No results found for: "NWG956"  No results found for: "CAN125"  No results found for: "CAN153"  No results found for: "CA2729"  No components found for: "HGQUANT"  No results found for: "CEA1", "CEA" / No results found for: "CEA1", "CEA"   No results found for: "AFPTUMOR"  No results found for: "CHROMOGRNA"  No results found for: "HGBA", "HGBA2QUANT", "HGBFQUANT", "HGBSQUAN" (Hemoglobinopathy evaluation)   No results found for: "LDH"  Lab Results  Component Value Date   IRON 142 10/11/2022   TIBC 379.4 10/11/2022   IRONPCTSAT 37.4 10/11/2022   (Iron and TIBC)  Lab Results  Component Value Date   FERRITIN 51.1 10/11/2022    Urinalysis No results found for: "COLORURINE", "APPEARANCEUR", "LABSPEC", "PHURINE", "GLUCOSEU", "HGBUR", "BILIRUBINUR", "KETONESUR", "PROTEINUR", "UROBILINOGEN", "NITRITE", "LEUKOCYTESUR"   STUDIES:  No results found.   ELIGIBLE FOR AVAILABLE RESEARCH PROTOCOL: no   ASSESSMENT: 48 y.o. [place] woman positive for a deleterious BRCA1 mutation, BRCA1 c.213-11T>G (Intronic).  (1) genetics testing 04/12/2018 through the multi-cancer panel offered by in vitae found no additional mutations in AIP, ALK, APC, ATM, AXIN2,BAP1,  BARD1, BLM, BMPR1A, BRCA1, BRCA2, BRIP1, CASR, CDC73, CDH1,  CDK4, CDKN1B, CDKN1C, CDKN2A (p14ARF), CDKN2A (p16INK4a), CEBPA, CHEK2, CTNNA1, DICER1, DIS3L2, EGFR (c.2369C>T, p.Thr790Met variant only), EPCAM (Deletion/duplication testing only), FH, FLCN, GATA2, GPC3, GREM1 (Promoter region deletion/duplication testing only), HOXB13 (c.251G>A, p.Gly84Glu), HRAS, KIT, MAX, MEN1, MET, MITF (c.952G>A, p.Glu318Lys variant only), MLH1, MSH2, MSH3, MSH6, MUTYH, NBN, NF1, NF2, NTHL1, PALB2, PDGFRA, PHOX2B, PMS2, POLD1, POLE, POT1, PRKAR1A, PTCH1, PTEN, RAD50, RAD51C, RAD51D, RB1, RECQL4, RET, RUNX1, SDHAF2, SDHA (sequence changes only), SDHB, SDHC, SDHD, SMAD4,  SMARCA4, SMARCB1, SMARCE1, STK11, SUFU, TERC, TERT, TMEM127, TP53, TSC1, TSC2, VHL, WRN and WT1.  (2) risk reduction:   (a) status post left salpingectomy and right salpingooophorectomy 10/30/2018 with benign pathology   (i) patient had congenital absence of left ovary   (b) opted against anti- estrogens  (c) considering bilateral mastectomies  (3) intensified screening:  (a) mammography  breast MRI December and biannual MD breast exam  (4) menopausal symptoms  (a) on gabapentin po Qhs  (b) opted against venlafaxine  (c) considering pelvic rehabilitation   PLAN:  BRCA1 mutation Prophylactic bilateral mastectomy performed with complications including necrosis of nipples and need for removal. Tissue expanders placed, but right expander removed due to necrotic tissue. -Plan for additional surgeries to place tissue expander in right breast, followed by implant placement. -Consideration for 3D nipple tattooing after implant placement.  Weight loss Significant weight loss achieved through diet, exercise, and use of Zepbound/Mounjaro. Currently on maintenance dose. -Continue current regimen.  Hormone Replacement Therapy (HRT) Pt is interested in pursuing this Discussion of potential benefits of HRT for cardiac and cognitive health, now that risk of breast cancer is significantly reduced  post-mastectomy. -Consult with Dr. Pricilla Holm or partner regarding initiation of HRT, mindful of potential increased risk of blood clots.  Follow-up Annual follow-up for continued monitoring and care.  Total encounter time 30 minutes.  *Total Encounter Time as defined by the Centers for Medicare and Medicaid Services includes, in addition to the face-to-face time of a patient visit (documented in the note above) non-face-to-face time: obtaining and reviewing outside history, ordering and reviewing medications, tests or procedures, care coordination (communications with other health care professionals or caregivers) and documentation in the medical record.

## 2023-05-20 ENCOUNTER — Telehealth: Payer: Self-pay | Admitting: *Deleted

## 2023-05-20 NOTE — Telephone Encounter (Signed)
 LMOM the patient to call the office back. Patient needs to be scheduled for a new patient appt with Dr Tamela Oddi on 3/26

## 2023-05-21 NOTE — Therapy (Signed)
 OUTPATIENT PHYSICAL THERAPY  UPPER EXTREMITY ONCOLOGY EVALUATION  Patient Name: [name] MRN: [id number] DOB:[birth year], 48 y.o., female Today's Date: 05/21/2023  END OF SESSION:   Past Medical History:  Diagnosis Date   Anxiety 2011   BRCA1 gene mutation positive    dx 01/ 2020   Encounter for weight management 07/12/2022   Family history of breast cancer    Gestational diabetes mellitus, class A2 06/17/2014   Gestational thrombocytopenia (HCC) 06/17/2014   Hemorrhoids    History of gestational diabetes    Hypothyroidism    followed by dr Billy Coast   Mixed hyperlipidemia 04/02/2022   Past Surgical History:  Procedure Laterality Date   BREAST RECONSTRUCTION WITH PLACEMENT OF TISSUE EXPANDER AND FLEX HD (ACELLULAR HYDRATED DERMIS) Bilateral 04/06/2023   Procedure: BILATERAL BREAST RECONSTRUCTION WITH PLACEMENT OF TISSUE EXPANDER AND FLEX HD (ACELLULAR HYDRATED DERMIS);  Surgeon: [name], MD;  Location: [facility];  Service: Plastics;  Laterality: Bilateral;   BREAST RECONSTRUCTION WITH PLACEMENT OF TISSUE EXPANDER AND FLEX HD (ACELLULAR HYDRATED DERMIS) Bilateral 04/22/2023   Procedure: REVISION OF BREAST MASTECTOMY WITH DEBRIDMENT AND REMOVAL OF RIGHT TISSUE EXPANDER AND FLEX HD (ACELLULAR HYDRATED DERMIS);  Surgeon: [name], MD;  Location: [facility];  Service: Plastics;  Laterality: Bilateral;   CESAREAN SECTION  2007 & 2010   x 2 - at [facility]   CESAREAN SECTION WITH BILATERAL TUBAL LIGATION Bilateral 06/15/2014   Procedure: REPEAT CESAREAN SECTION WITH BILATERAL TUBAL LIGATION;  Surgeon: Olivia Mackie, MD;  Location: [facility];  Service: Obstetrics;  Laterality: Bilateral;  EDD: 06/19/14   COLONOSCOPY WITH PROPOFOL  08/31/2012   NIPPLE SPARING MASTECTOMY Bilateral 04/06/2023   Procedure: BILATERAL NIPPLE SPARING MASTECTOMY;  Surgeon: [name], MD;  Location: [facility];  Service: General;  Laterality: Bilateral;   PEC BLOCK   ROBOTIC ASSISTED SALPINGO OOPHERECTOMY Right 10/30/2018   Procedure: XI ROBOTIC ASSISTED SALPINGO OOPHORECTOMY/With Lysis of Adhesions;  Surgeon: Olivia Mackie, MD;  Location: [facility];  Service: Gynecology;  Laterality: Right;  Requests 2 hrs.   TRIGGER FINGER RELEASE Left    TUBAL LIGATION  2016   wisdom teeth ext  teen   Patient Active Problem List   Diagnosis Date Noted   BRCA positive 04/06/2023   External hemorrhoids without complication 03/15/2023   Morbid obesity (HCC) 03/15/2023   History of mastopexy 01/06/2023   Encounter for weight management 07/12/2022   History of gestational diabetes 04/02/2022   High coronary artery calcium score 04/02/2022   Mixed hyperlipidemia 04/02/2022   History of panic attacks 04/02/2022   Pain in finger of left hand 07/30/2021   Encounter for orthopedic follow-up care 07/23/2021   Inflammatory pain 06/26/2021   Lichen sclerosus 05/07/2020   Menopausal symptom 05/07/2020   Osteoarthritis of left knee 02/22/2019   Morbid obesity with BMI of 40.0-44.9, adult (HCC) 01/04/2019   Breast cancer screening, high risk patient 05/24/2018   BRCA1 gene mutation positive    Family history of breast cancer    Hypothyroidism 06/17/2014   Postpartum care following cesarean delivery (4/2) 06/15/2014   Born by cesarean section 06/15/2014   Cesarean delivery delivered 06/15/2014   Internal bleeding hemorrhoids 10/24/2012       REFERRING PROVIDER: [name], MD  REFERRING DIAG: s/p Bilateral Mastectomies for BRCA  THERAPY DIAG:  No diagnosis found.  ONSET DATE: 04/06/2023  Rationale for Evaluation and Treatment: Rehabilitation  SUBJECTIVE:  SUBJECTIVE STATEMENT: ***  PERTINENT HISTORY:  Pt underwent  Bilateral   nipple-sparing mastectomies on April 06, 2023, with tissue expanders placed.  Shortly thereafter she soon noticed discoloration of her nipples and developed a fever. Her condition worsened with open sores and necrotic tissue. She had  a second surgery on 04/22/2023 to remove both nipples and the right tissue expander. She has a tissue expander on the left side, which has healed well,   PAIN:  Are you having pain? {yes/no:20286} NPRS scale: ***/10 Pain location: *** Pain orientation: {Pain Orientation:25161}  PAIN TYPE: {type:313116} Pain description: {PAIN DESCRIPTION:21022940}  Aggravating factors: *** Relieving factors: ***  PRECAUTIONS: BRCA1+, hypothyroidism  RED FLAGS: {PT Red Flags:29287}   WEIGHT BEARING RESTRICTIONS: No  FALLS:  Has patient fallen in last 6 months? {fallsyesno:27318}  LIVING ENVIRONMENT: Lives with: lives with their family, husband and 3 children 91, 51, and 3  Lives in: {Lives in:[address]}    OCCUPATION: IT project mgr in IT at [employer]   LEISURE: ***  HAND DOMINANCE: {RIGHT/LEFT:21944}   PRIOR LEVEL OF FUNCTION: Independent  PATIENT GOALS: ***   OBJECTIVE: Note: Objective measures were completed at Evaluation unless otherwise noted.  COGNITION: Overall cognitive status: Within functional limits for tasks assessed   PALPATION: ***  OBSERVATIONS / OTHER ASSESSMENTS: ***  SENSATION: Light touch: Deficits ***    POSTURE: forward head, rounded shoulders   UPPER EXTREMITY AROM/PROM:  A/PROM RIGHT   eval   Shoulder extension   Shoulder flexion   Shoulder abduction   Shoulder internal rotation   Shoulder external rotation     (Blank rows = not tested)  A/PROM LEFT   eval  Shoulder extension   Shoulder flexion   Shoulder abduction   Shoulder internal rotation   Shoulder external rotation     (Blank rows = not tested)  CERVICAL AROM: All within normal limits:    Percent limited  Flexion   Extension   Right lateral flexion    Left lateral flexion   Right rotation   Left rotation     UPPER EXTREMITY STRENGTH:   LYMPHEDEMA ASSESSMENTS:   SURGERY TYPE/DATE: 04/06/2023 Bilateral nIpple Sparing mastectomies with immediate Reconstruction, 04/22/2023 Revision with removal of Right tissue expander and nipples  NUMBER OF LYMPH NODES REMOVED:   CHEMOTHERAPY: No  RADIATION:NO  HORMONE TREATMENT: ***  INFECTIONS: YES    FUNCTIONAL TESTS:  {Functional tests:24029}  GAIT: Distance walked: *** Assistive device utilized: {Assistive devices:23999} Level of assistance: {Levels of assistance:24026} Comments: ***    QUICK DASH SURVEY: ***                                                                                                                            TREATMENT DATE: ***    PATIENT EDUCATION:  Education details: *** Person educated: {Person educated:25204} Education method: {Education Method:25205} Education comprehension: {Education Comprehension:25206}  HOME EXERCISE PROGRAM: ***  ASSESSMENT:  CLINICAL IMPRESSION: Patient is a 48 y.o. female who  was seen today for physical therapy evaluation and treatment S/p bilateral prophylactic mastectomies for BRCA1+ performed on 04/06/2023, with another surgery on 04/22/2023 for bilateral nipple excision and right tissue expander removal due to necrosis .   OBJECTIVE IMPAIRMENTS: {opptimpairments:25111}.   ACTIVITY LIMITATIONS: {activitylimitations:27494}  PARTICIPATION LIMITATIONS: {participationrestrictions:25113}  PERSONAL FACTORS: {Personal factors:25162} are also affecting patient's functional outcome.   REHAB POTENTIAL: {rehabpotential:25112}  CLINICAL DECISION MAKING: {clinical decision making:25114}  EVALUATION COMPLEXITY: {Evaluation complexity:25115}  GOALS: Goals reviewed with patient? {yes/no:20286}  SHORT TERM GOALS: Target date: ***  Pt will be independent in  a HEP to improve shoulder ROM Baseline: Goal status:  INITIAL  2.  *** Baseline:  Goal status: INITIAL  3.  *** Baseline:  Goal status: INITIAL  4.  *** Baseline:  Goal status: INITIAL  5.  *** Baseline:  Goal status: INITIAL  6.  *** Baseline:  Goal status: INITIAL  LONG TERM GOALS: Target date: ***  *** Baseline:  Goal status: INITIAL  2.  *** Baseline:  Goal status: INITIAL  3.  *** Baseline:  Goal status: INITIAL  4.  *** Baseline:  Goal status: INITIAL  5.  *** Baseline:  Goal status: INITIAL  6.  *** Baseline:  Goal status: INITIAL  PLAN:  PT FREQUENCY: {rehab frequency:25116}  PT DURATION: {rehab duration:25117}  PLANNED INTERVENTIONS: {rehab planned interventions:25118::"Patient/Family education","Balance training","Joint mobilization","Therapeutic exercises","Therapeutic activity","Neuromuscular re-education","Gait training","Self Care"}  PLAN FOR NEXT SESSION: ***  Waynette Buttery, PT 05/21/2023, 9:08 PM

## 2023-05-23 ENCOUNTER — Ambulatory Visit: Attending: General Surgery

## 2023-05-23 ENCOUNTER — Other Ambulatory Visit: Payer: Self-pay

## 2023-05-23 DIAGNOSIS — Z1501 Genetic susceptibility to malignant neoplasm of breast: Secondary | ICD-10-CM | POA: Insufficient documentation

## 2023-05-23 DIAGNOSIS — M25611 Stiffness of right shoulder, not elsewhere classified: Secondary | ICD-10-CM | POA: Insufficient documentation

## 2023-05-23 DIAGNOSIS — M25612 Stiffness of left shoulder, not elsewhere classified: Secondary | ICD-10-CM | POA: Diagnosis not present

## 2023-05-23 DIAGNOSIS — Z9889 Other specified postprocedural states: Secondary | ICD-10-CM | POA: Diagnosis not present

## 2023-05-24 ENCOUNTER — Encounter: Payer: Self-pay | Admitting: Hematology and Oncology

## 2023-05-24 NOTE — Telephone Encounter (Signed)
 Spoke with the patient regarding the referral to GYN oncology. Patient scheduled as new patient with Dr Antionette Char on 3/26 at 11 am.  Patient given an arrival time of 10:30 am.  Explained to the patient the the doctor will perform a pelvic exam at this visit. Patient given the policy that only one visitor allowed and that visitor must be over 16 yrs are allowed in the Cancer Center. Patient given the address/phone number for the clinic and that the center offers free valet service. Patient aware that masks required.

## 2023-05-26 ENCOUNTER — Ambulatory Visit

## 2023-05-26 DIAGNOSIS — M25611 Stiffness of right shoulder, not elsewhere classified: Secondary | ICD-10-CM

## 2023-05-26 DIAGNOSIS — M25612 Stiffness of left shoulder, not elsewhere classified: Secondary | ICD-10-CM

## 2023-05-26 DIAGNOSIS — Z9889 Other specified postprocedural states: Secondary | ICD-10-CM | POA: Diagnosis not present

## 2023-05-26 DIAGNOSIS — Z1501 Genetic susceptibility to malignant neoplasm of breast: Secondary | ICD-10-CM

## 2023-05-26 NOTE — Therapy (Signed)
 OUTPATIENT PHYSICAL THERAPY  UPPER EXTREMITY ONCOLOGY TREATMENT  Patient Name: [name] MRN: [id number] DOB:18-Jul-1975, 48 y.o., female Today's Date: 05/26/2023  END OF SESSION:  PT End of Session - 05/26/23 0754     Visit Number 2    Number of Visits 8    Date for PT Re-Evaluation 06/20/23    Authorization Type BCBS    Authorization Time Period no Auth necessary    PT Start Time 0756    PT Stop Time 0856    PT Time Calculation (min) 60 min    Activity Tolerance Patient tolerated treatment well    Behavior During Therapy [facility] for tasks assessed/performed             Past Medical History:  Diagnosis Date   Anxiety 2011   BRCA1 gene mutation positive    dx 01/ 2020   Encounter for weight management 07/12/2022   Family history of breast cancer    Gestational diabetes mellitus, class A2 06/17/2014   Gestational thrombocytopenia (HCC) 06/17/2014   Hemorrhoids    History of gestational diabetes    Hypothyroidism    followed by dr Billy Coast   Mixed hyperlipidemia 04/02/2022   Past Surgical History:  Procedure Laterality Date   BREAST RECONSTRUCTION WITH PLACEMENT OF TISSUE EXPANDER AND FLEX HD (ACELLULAR HYDRATED DERMIS) Bilateral 04/06/2023   Procedure: BILATERAL BREAST RECONSTRUCTION WITH PLACEMENT OF TISSUE EXPANDER AND FLEX HD (ACELLULAR HYDRATED DERMIS);  Surgeon: [name], MD;  Location: [facility];  Service: Plastics;  Laterality: Bilateral;   BREAST RECONSTRUCTION WITH PLACEMENT OF TISSUE EXPANDER AND FLEX HD (ACELLULAR HYDRATED DERMIS) Bilateral 04/22/2023   Procedure: REVISION OF BREAST MASTECTOMY WITH DEBRIDMENT AND REMOVAL OF RIGHT TISSUE EXPANDER AND FLEX HD (ACELLULAR HYDRATED DERMIS);  Surgeon: [name], MD;  Location: [facility];  Service: Plastics;  Laterality: Bilateral;   CESAREAN SECTION  2007 & 2010   x 2 - at [facility]   CESAREAN SECTION WITH BILATERAL TUBAL LIGATION Bilateral 06/15/2014   Procedure: REPEAT  CESAREAN SECTION WITH BILATERAL TUBAL LIGATION;  Surgeon: Olivia Mackie, MD;  Location: [facility];  Service: Obstetrics;  Laterality: Bilateral;  EDD: 06/19/14   COLONOSCOPY WITH PROPOFOL  08/31/2012   NIPPLE SPARING MASTECTOMY Bilateral 04/06/2023   Procedure: BILATERAL NIPPLE SPARING MASTECTOMY;  Surgeon: [name], MD;  Location: [facility];  Service: General;  Laterality: Bilateral;  PEC BLOCK   ROBOTIC ASSISTED SALPINGO OOPHERECTOMY Right 10/30/2018   Procedure: XI ROBOTIC ASSISTED SALPINGO OOPHORECTOMY/With Lysis of Adhesions;  Surgeon: Olivia Mackie, MD;  Location: [facility];  Service: Gynecology;  Laterality: Right;  Requests 2 hrs.   TRIGGER FINGER RELEASE Left    TUBAL LIGATION  2016   wisdom teeth ext  teen   Patient Active Problem List   Diagnosis Date Noted   BRCA positive 04/06/2023   External hemorrhoids without complication 03/15/2023   Morbid obesity (HCC) 03/15/2023   History of mastopexy 01/06/2023   Encounter for weight management 07/12/2022   History of gestational diabetes 04/02/2022   High coronary artery calcium score 04/02/2022   Mixed hyperlipidemia 04/02/2022   History of panic attacks 04/02/2022   Pain in finger of left hand 07/30/2021   Encounter for orthopedic follow-up care 07/23/2021   Inflammatory pain 06/26/2021   Lichen sclerosus 05/07/2020   Menopausal symptom 05/07/2020   Osteoarthritis of left knee 02/22/2019   Morbid obesity with BMI of 40.0-44.9, adult (HCC) 01/04/2019   Breast cancer screening, high  risk patient 05/24/2018   BRCA1 gene mutation positive    Family history of breast cancer    Hypothyroidism 06/17/2014   Postpartum care following cesarean delivery (4/2) 06/15/2014   Born by cesarean section 06/15/2014   Cesarean delivery delivered 06/15/2014   Internal bleeding hemorrhoids 10/24/2012       REFERRING PROVIDER: [name], MD  REFERRING DIAG: s/p Bilateral Mastectomies for  BRCA  THERAPY DIAG:  Postoperative state  BRCA1 positive  Stiffness of right shoulder, not elsewhere classified  Stiffness of left shoulder, not elsewhere classified  ONSET DATE: 04/06/2023  Rationale for Evaluation and Treatment: Rehabilitation  SUBJECTIVE:                                                                                                                                                                                           SUBJECTIVE STATEMENT: 05/26/2023 Exercises are going pretty well. I did them 1x each day. No present pain.   EVAL  Incisions  on both sides are doing well now. My ROM is better, but there are certain things I can't do. Reaching overhead and back is hard.  Moving quickly can be uncomfortable, and gets sore afterwards. The expander gets uncomfortable in certain positions. The soonest she can have the expander on the right side is mid May but her oldest child graduates June 7, so a lot will depend on the date she is offered.  PERTINENT HISTORY:  Pt underwent prophylactic Bilateral  nipple-sparing mastectomies on April 06, 2023, with tissue expanders placed due to BRCA 1+  Shortly thereafter she  noticed discoloration of her nipples and developed a fever. Her condition worsened with open sores and necrotic tissue. She had  a second surgery on 04/22/2023 to remove both nipples and the right tissue expander. She has a tissue expander on the left side, which has healed well. She had breast lifts in 12/2022 and a tummy tuck as she lost 100 lbs recently. She had bilateral Salpingo-oophorectomies on 10/30/2018.   PAIN:  Are you having pain? Yes NPRS scale: 0/10-3/10 worst with quick movements. Pain location: left greater than right Pain orientation: Bilateral  PAIN TYPE: sharp and tight Pain description: intermittent  Aggravating factors: reaching high, taking off a fitted shirt, doesn't lay on left side Relieving factors: avoiding extremes of  reaching.  PRECAUTIONS: BRCA1+, hypothyroidism  RED FLAGS: None   WEIGHT BEARING RESTRICTIONS: No  FALLS:  Has patient fallen in last 6 months? No  LIVING ENVIRONMENT: Lives with: lives with their family, husband and 3 children 30, 70, and 8  Lives in: House/apartment    OCCUPATION: IT project mgr  LEISURE: walking, going to sporting events  HAND DOMINANCE: right   PRIOR LEVEL OF FUNCTION: Independent  PATIENT GOALS: To have normal rROM   OBJECTIVE: Note: Objective measures were completed at Evaluation unless otherwise noted.  COGNITION: Overall cognitive status: Within functional limits for tasks assessed   PALPATION: No tenderness UT  OBSERVATIONS / OTHER ASSESSMENTS: incisions healing;prior breast lift, small scab present on left breast incision. Tiny scab on right. Lower excisions long and extending under the arms Left breast with expander present, right expander removed in second surgery when nipples were removed  SENSATION: Light touch: Deficits      POSTURE: forward head, rounded shoulders   UPPER EXTREMITY AROM/PROM:  A/PROM RIGHT   eval   Shoulder extension 70  Shoulder flexion 135  Shoulder abduction 94  Shoulder internal rotation NT  Shoulder external rotation NT    (Blank rows = not tested)  A/PROM LEFT   eval  Shoulder extension 70  Shoulder flexion 151  Shoulder abduction 110  Shoulder internal rotation 75  Shoulder external rotation 83    (Blank rows = not tested)  CERVICAL AROM: All within normal limits:     UPPER EXTREMITY STRENGTH:   LYMPHEDEMA ASSESSMENTS:   SURGERY TYPE/DATE: 04/06/2023 Bilateral nIpple Sparing mastectomies with immediate Reconstruction, 04/22/2023 Revision with removal of Right tissue expander and nipples  NUMBER OF LYMPH NODES REMOVED: 0  CHEMOTHERAPY: No  RADIATION:NO  HORMONE TREATMENT: NO  INFECTIONS: YES    FUNCTIONAL TESTS:    GAIT: WNL     QUICK DASH SURVEY: 36%                                                                                                                             TREATMENT DATE:   05/26/2023 Overhead pulleys flexion, scaption, abd x 2:30 min ea Ball rolls on wall x 10 flexion, x 5 abd bilaterally STM bilateral UT, levator, pectorals with cocoa butter PROM bilateral shoulder flexion, scaption, abd, IR and ER  05/23/2023 Pt was educated in 4 post op exercises; Supine flexion, stargazer, sitting or standing scapular retraction and standing wall slides. She performed each x 5 reps holding 5 seconds. HEP was updated with pictures   PATIENT EDUCATION:  Education details: 4 post op exercises, LOS, POC, treatment interventions Person educated: Patient Education method: Explanation, Demonstration, and Handouts Education comprehension: verbalized understanding and returned demonstration  HOME EXERCISE PROGRAM: 4 post op exercises  ASSESSMENT:  CLINICAL IMPRESSION: Pt loosened up nicely after using the pulleys . She had mild tightness in bilateral UT and pectorals but no increased pain with STM. PROM progressing well. Advised t to to do her stretches 2x/day.  OBJECTIVE IMPAIRMENTS: decreased activity tolerance, decreased knowledge of condition, decreased ROM, decreased strength, postural dysfunction, and pain.   ACTIVITY LIMITATIONS: lifting, sleeping, dressing, and reach over head  PARTICIPATION LIMITATIONS: cleaning and activities requiring reaching  PERSONAL FACTORS: 1 comorbidity: BRCA1+ s/p bilateral mastectomies with tissue expanders, and second surgery  to remove right TE, nipples  are also affecting patient's functional outcome.   REHAB POTENTIAL: Excellent  CLINICAL DECISION MAKING: Stable/uncomplicated  EVALUATION COMPLEXITY: Low  GOALS: Goals reviewed with patient? Yes  /SHORT TERM GOALS= LONG TERM GOALS: Target date: 06/20/2023  Pt will be independent in  a HEP to improve shoulder ROM and strength Baseline: Goal status:  INITIAL  2.  Pt will demonstrate return to full or near full ROM without increased pain Baseline:  Goal status: INITIAL  3.  Quick dash will improve to no greater than 12% to demonstrate improved function Baseline:  Goal status: INITIAL  4.  Pt will be able to pull off a tight shirt without difficulty Baseline:  Goal status: INITIAL  5.  Pt will be independent in more advanced HEP to include strengthening  Goal status: Initial   PLAN:  PT FREQUENCY: 1-2x/week  PT DURATION: 4 weeks  PLANNED INTERVENTIONS: 97164- PT Re-evaluation, 97110-Therapeutic exercises, 97530- Therapeutic activity, [id number]- Neuromuscular re-education, 97535- Self Care, and 16109- Manual therapy  PLAN FOR NEXT SESSION:  How are exercises going? Pulleys, ball rolls, supine wand if ready, progress to strength when ready, scar massage on right  Alvira Monday, PT 05/26/23 8:57 AM

## 2023-05-31 ENCOUNTER — Ambulatory Visit

## 2023-05-31 ENCOUNTER — Encounter: Payer: Self-pay | Admitting: Obstetrics & Gynecology

## 2023-05-31 DIAGNOSIS — M25612 Stiffness of left shoulder, not elsewhere classified: Secondary | ICD-10-CM

## 2023-05-31 DIAGNOSIS — Z1501 Genetic susceptibility to malignant neoplasm of breast: Secondary | ICD-10-CM | POA: Diagnosis not present

## 2023-05-31 DIAGNOSIS — M25611 Stiffness of right shoulder, not elsewhere classified: Secondary | ICD-10-CM | POA: Diagnosis not present

## 2023-05-31 DIAGNOSIS — Z9889 Other specified postprocedural states: Secondary | ICD-10-CM

## 2023-05-31 DIAGNOSIS — Z1509 Genetic susceptibility to other malignant neoplasm: Secondary | ICD-10-CM

## 2023-05-31 NOTE — Therapy (Signed)
 OUTPATIENT PHYSICAL THERAPY  UPPER EXTREMITY ONCOLOGY TREATMENT  Patient Name: [name] MRN: [id number] DOB:1975/10/24, 48 y.o., female Today's Date: 05/31/2023  END OF SESSION:  PT End of Session - 05/31/23 0858     Visit Number 3    Number of Visits 8    Date for PT Re-Evaluation 06/20/23    Authorization Type BCBS    PT Start Time 0900    PT Stop Time 0947    PT Time Calculation (min) 47 min    Activity Tolerance Patient tolerated treatment well    Behavior During Therapy [facility] for tasks assessed/performed             Past Medical History:  Diagnosis Date   Anxiety 2011   BRCA1 gene mutation positive    dx 01/ 2020   Encounter for weight management 07/12/2022   Family history of breast cancer    Gestational diabetes mellitus, class A2 06/17/2014   Gestational thrombocytopenia (HCC) 06/17/2014   Hemorrhoids    History of gestational diabetes    Hypothyroidism    followed by dr Billy Coast   Mixed hyperlipidemia 04/02/2022   Past Surgical History:  Procedure Laterality Date   BREAST RECONSTRUCTION WITH PLACEMENT OF TISSUE EXPANDER AND FLEX HD (ACELLULAR HYDRATED DERMIS) Bilateral 04/06/2023   Procedure: BILATERAL BREAST RECONSTRUCTION WITH PLACEMENT OF TISSUE EXPANDER AND FLEX HD (ACELLULAR HYDRATED DERMIS);  Surgeon: [name], MD;  Location: [facility];  Service: Plastics;  Laterality: Bilateral;   BREAST RECONSTRUCTION WITH PLACEMENT OF TISSUE EXPANDER AND FLEX HD (ACELLULAR HYDRATED DERMIS) Bilateral 04/22/2023   Procedure: REVISION OF BREAST MASTECTOMY WITH DEBRIDMENT AND REMOVAL OF RIGHT TISSUE EXPANDER AND FLEX HD (ACELLULAR HYDRATED DERMIS);  Surgeon: [name], MD;  Location: [facility];  Service: Plastics;  Laterality: Bilateral;   CESAREAN SECTION  2007 & 2010   x 2 - at [facility]   CESAREAN SECTION WITH BILATERAL TUBAL LIGATION Bilateral 06/15/2014   Procedure: REPEAT CESAREAN SECTION WITH BILATERAL TUBAL LIGATION;   Surgeon: Olivia Mackie, MD;  Location: [facility];  Service: Obstetrics;  Laterality: Bilateral;  EDD: 06/19/14   COLONOSCOPY WITH PROPOFOL  08/31/2012   NIPPLE SPARING MASTECTOMY Bilateral 04/06/2023   Procedure: BILATERAL NIPPLE SPARING MASTECTOMY;  Surgeon: [name], MD;  Location: [facility];  Service: General;  Laterality: Bilateral;  PEC BLOCK   ROBOTIC ASSISTED SALPINGO OOPHERECTOMY Right 10/30/2018   Procedure: XI ROBOTIC ASSISTED SALPINGO OOPHORECTOMY/With Lysis of Adhesions;  Surgeon: Olivia Mackie, MD;  Location: [facility];  Service: Gynecology;  Laterality: Right;  Requests 2 hrs.   TRIGGER FINGER RELEASE Left    TUBAL LIGATION  2016   wisdom teeth ext  teen   Patient Active Problem List   Diagnosis Date Noted   BRCA positive 04/06/2023   External hemorrhoids without complication 03/15/2023   Morbid obesity (HCC) 03/15/2023   History of mastopexy 01/06/2023   Encounter for weight management 07/12/2022   History of gestational diabetes 04/02/2022   High coronary artery calcium score 04/02/2022   Mixed hyperlipidemia 04/02/2022   History of panic attacks 04/02/2022   Pain in finger of left hand 07/30/2021   Encounter for orthopedic follow-up care 07/23/2021   Inflammatory pain 06/26/2021   Lichen sclerosus 05/07/2020   Menopausal symptom 05/07/2020   Osteoarthritis of left knee 02/22/2019   Morbid obesity with BMI of 40.0-44.9, adult (HCC) 01/04/2019   Breast cancer screening, high risk patient 05/24/2018   BRCA1 gene mutation positive  Family history of breast cancer    Hypothyroidism 06/17/2014   Postpartum care following cesarean delivery (4/2) 06/15/2014   Born by cesarean section 06/15/2014   Cesarean delivery delivered 06/15/2014   Internal bleeding hemorrhoids 10/24/2012       REFERRING PROVIDER: [name], MD  REFERRING DIAG: s/p Bilateral Mastectomies for BRCA  THERAPY DIAG:  Postoperative  state  BRCA1 positive  Stiffness of right shoulder, not elsewhere classified  Stiffness of left shoulder, not elsewhere classified  ONSET DATE: 04/06/2023  Rationale for Evaluation and Treatment: Rehabilitation  SUBJECTIVE:                                                                                                                                                                                           SUBJECTIVE STATEMENT: 05/31/2023  I think I can feel improvement. Its getting more comfortable to get off fitted shirts. I am using my arms more. May I leave a little early today?   EVAL  Incisions  on both sides are doing well now. My ROM is better, but there are certain things I can't do. Reaching overhead and back is hard.  Moving quickly can be uncomfortable, and gets sore afterwards. The expander gets uncomfortable in certain positions. The soonest she can have the expander on the right side is mid May but her oldest child graduates June 7, so a lot will depend on the date she is offered.  PERTINENT HISTORY:  Pt underwent prophylactic Bilateral  nipple-sparing mastectomies on April 06, 2023, with tissue expanders placed due to BRCA 1+  Shortly thereafter she  noticed discoloration of her nipples and developed a fever. Her condition worsened with open sores and necrotic tissue. She had  a second surgery on 04/22/2023 to remove both nipples and the right tissue expander. She has a tissue expander on the left side, which has healed well. She had breast lifts in 12/2022 and a tummy tuck as she lost 100 lbs recently. She had bilateral Salpingo-oophorectomies on 10/30/2018.   PAIN:  Are you having pain? None presently NPRS scale: 0/10-3/10 worst with quick movements. Pain location: left greater than right Pain orientation: Bilateral  PAIN TYPE: sharp and tight Pain description: intermittent  Aggravating factors: reaching high, taking off a fitted shirt, doesn't lay on left  side Relieving factors: avoiding extremes of reaching.  PRECAUTIONS: BRCA1+, hypothyroidism  RED FLAGS: None   WEIGHT BEARING RESTRICTIONS: No  FALLS:  Has patient fallen in last 6 months? No  LIVING ENVIRONMENT: Lives with: lives with their family, husband and 3 children 68, 69, and 8  Lives in: House/apartment    OCCUPATION:  IT project mgr    LEISURE: walking, going to sporting events  HAND DOMINANCE: right   PRIOR LEVEL OF FUNCTION: Independent  PATIENT GOALS: To have normal rROM   OBJECTIVE: Note: Objective measures were completed at Evaluation unless otherwise noted.  COGNITION: Overall cognitive status: Within functional limits for tasks assessed   PALPATION: No tenderness UT  OBSERVATIONS / OTHER ASSESSMENTS: incisions healing;prior breast lift, small scab present on left breast incision. Tiny scab on right. Lower excisions long and extending under the arms Left breast with expander present, right expander removed in second surgery when nipples were removed  SENSATION: Light touch: Deficits      POSTURE: forward head, rounded shoulders   UPPER EXTREMITY AROM/PROM:  A/PROM RIGHT   eval   Shoulder extension 70  Shoulder flexion 135  Shoulder abduction 94  Shoulder internal rotation NT  Shoulder external rotation NT    (Blank rows = not tested)  A/PROM LEFT   eval  Shoulder extension 70  Shoulder flexion 151  Shoulder abduction 110  Shoulder internal rotation 75  Shoulder external rotation 83    (Blank rows = not tested)  CERVICAL AROM: All within normal limits:     UPPER EXTREMITY STRENGTH:   LYMPHEDEMA ASSESSMENTS:   SURGERY TYPE/DATE: 04/06/2023 Bilateral nIpple Sparing mastectomies with immediate Reconstruction, 04/22/2023 Revision with removal of Right tissue expander and nipples  NUMBER OF LYMPH NODES REMOVED: 0  CHEMOTHERAPY: No  RADIATION:NO  HORMONE TREATMENT: NO  INFECTIONS: YES    FUNCTIONAL TESTS:    GAIT:  WNL     QUICK DASH SURVEY: 36%                                                                                                                            TREATMENT DATE:   05/31/2023 Overhead pulleys flexion, scaption, abd x 2:30 min ea Ball rolls on wall x 10 flexion, x 5 abd bilaterally 3 D AROM bilateral flexion, scaption,  horizontal , snow angels x 5 Scar massage to right medial and lateral incision avoiding T zone PROM bilateral shoulder flexion, scaption, abd, IR and ER  05/26/2023 Overhead pulleys flexion, scaption, abd x 2:30 min ea Ball rolls on wall x 10 flexion, x 5 abd bilaterally STM bilateral UT, levator, pectorals with cocoa butter PROM bilateral shoulder flexion, scaption, abd, IR and ER  05/23/2023 Pt was educated in 4 post op exercises; Supine flexion, stargazer, sitting or standing scapular retraction and standing wall slides. She performed each x 5 reps holding 5 seconds. HEP was updated with pictures   PATIENT EDUCATION:  Education details: 4 post op exercises, LOS, POC, treatment interventions Person educated: Patient Education method: Explanation, Demonstration, and Handouts Education comprehension: verbalized understanding and returned demonstration  HOME EXERCISE PROGRAM: 4 post op exercises  ASSESSMENT:  CLINICAL IMPRESSION: Excellent progress with bilateral ROM; nearly full passively. Pt moving better overall and less guarded. Small scab still noted on left chest. Start strength next  OBJECTIVE IMPAIRMENTS: decreased activity tolerance, decreased knowledge of condition, decreased ROM, decreased strength, postural dysfunction, and pain.   ACTIVITY LIMITATIONS: lifting, sleeping, dressing, and reach over head  PARTICIPATION LIMITATIONS: cleaning and activities requiring reaching  PERSONAL FACTORS: 1 comorbidity: BRCA1+ s/p bilateral mastectomies with tissue expanders, and second surgery to remove right TE, nipples  are also affecting patient's  functional outcome.   REHAB POTENTIAL: Excellent  CLINICAL DECISION MAKING: Stable/uncomplicated  EVALUATION COMPLEXITY: Low  GOALS: Goals reviewed with patient? Yes  /SHORT TERM GOALS= LONG TERM GOALS: Target date: 06/20/2023  Pt will be independent in  a HEP to improve shoulder ROM and strength Baseline: Goal status: INITIAL  2.  Pt will demonstrate return to full or near full ROM without increased pain Baseline:  Goal status: INITIAL  3.  Quick dash will improve to no greater than 12% to demonstrate improved function Baseline:  Goal status: INITIAL  4.  Pt will be able to pull off a tight shirt without difficulty Baseline:  Goal status: INITIAL  5.  Pt will be independent in more advanced HEP to include strengthening  Goal status: Initial   PLAN:  PT FREQUENCY: 1-2x/week  PT DURATION: 4 weeks  PLANNED INTERVENTIONS: 97164- PT Re-evaluation, 97110-Therapeutic exercises, 97530- Therapeutic activity, 97112- Neuromuscular re-education, 97535- Self Care, and 16109- Manual therapy  PLAN FOR NEXT SESSION: start theraband next, foam roll, supine wand if ready, progress to strength when ready, scar massage on right  Alvira Monday, PT 05/31/23 9:49 AM

## 2023-06-02 ENCOUNTER — Ambulatory Visit

## 2023-06-02 DIAGNOSIS — M25611 Stiffness of right shoulder, not elsewhere classified: Secondary | ICD-10-CM | POA: Diagnosis not present

## 2023-06-02 DIAGNOSIS — Z9889 Other specified postprocedural states: Secondary | ICD-10-CM

## 2023-06-02 DIAGNOSIS — M25612 Stiffness of left shoulder, not elsewhere classified: Secondary | ICD-10-CM

## 2023-06-02 DIAGNOSIS — Z1501 Genetic susceptibility to malignant neoplasm of breast: Secondary | ICD-10-CM

## 2023-06-02 NOTE — Therapy (Signed)
 OUTPATIENT PHYSICAL THERAPY  UPPER EXTREMITY ONCOLOGY TREATMENT  Patient Name: [name] MRN: [id number] DOB:01-22-1976, 48 y.o., female Today's Date: 06/02/2023  END OF SESSION:  PT End of Session - 06/02/23 0800     Visit Number 4    Number of Visits 8    Date for PT Re-Evaluation 06/20/23    Authorization Type [employer]    Authorization Time Period no Auth necessary    PT Start Time 0802    PT Stop Time 0851    PT Time Calculation (min) 49 min    Activity Tolerance Patient tolerated treatment well    Behavior During Therapy [facility] for tasks assessed/performed             Past Medical History:  Diagnosis Date   Anxiety 2011   BRCA1 gene mutation positive    dx 01/ 2020   Encounter for weight management 07/12/2022   Family history of breast cancer    Gestational diabetes mellitus, class A2 06/17/2014   Gestational thrombocytopenia (HCC) 06/17/2014   Hemorrhoids    History of gestational diabetes    Hypothyroidism    followed by dr Billy Coast   Mixed hyperlipidemia 04/02/2022   Past Surgical History:  Procedure Laterality Date   BREAST RECONSTRUCTION WITH PLACEMENT OF TISSUE EXPANDER AND FLEX HD (ACELLULAR HYDRATED DERMIS) Bilateral 04/06/2023   Procedure: BILATERAL BREAST RECONSTRUCTION WITH PLACEMENT OF TISSUE EXPANDER AND FLEX HD (ACELLULAR HYDRATED DERMIS);  Surgeon: [name], MD;  Location: [facility];  Service: Plastics;  Laterality: Bilateral;   BREAST RECONSTRUCTION WITH PLACEMENT OF TISSUE EXPANDER AND FLEX HD (ACELLULAR HYDRATED DERMIS) Bilateral 04/22/2023   Procedure: REVISION OF BREAST MASTECTOMY WITH DEBRIDMENT AND REMOVAL OF RIGHT TISSUE EXPANDER AND FLEX HD (ACELLULAR HYDRATED DERMIS);  Surgeon: [name], MD;  Location: [facility];  Service: Plastics;  Laterality: Bilateral;   CESAREAN SECTION  2007 & 2010   x 2 - at [facility]   CESAREAN SECTION WITH BILATERAL TUBAL LIGATION Bilateral 06/15/2014   Procedure: REPEAT  CESAREAN SECTION WITH BILATERAL TUBAL LIGATION;  Surgeon: Olivia Mackie, MD;  Location: [facility];  Service: Obstetrics;  Laterality: Bilateral;  EDD: 06/19/14   COLONOSCOPY WITH PROPOFOL  08/31/2012   NIPPLE SPARING MASTECTOMY Bilateral 04/06/2023   Procedure: BILATERAL NIPPLE SPARING MASTECTOMY;  Surgeon: [name], MD;  Location: [facility];  Service: General;  Laterality: Bilateral;  PEC BLOCK   ROBOTIC ASSISTED SALPINGO OOPHERECTOMY Right 10/30/2018   Procedure: XI ROBOTIC ASSISTED SALPINGO OOPHORECTOMY/With Lysis of Adhesions;  Surgeon: Olivia Mackie, MD;  Location: [facility];  Service: Gynecology;  Laterality: Right;  Requests 2 hrs.   TRIGGER FINGER RELEASE Left    TUBAL LIGATION  2016   wisdom teeth ext  teen   Patient Active Problem List   Diagnosis Date Noted   BRCA positive 04/06/2023   External hemorrhoids without complication 03/15/2023   Morbid obesity (HCC) 03/15/2023   History of mastopexy 01/06/2023   Encounter for weight management 07/12/2022   History of gestational diabetes 04/02/2022   High coronary artery calcium score 04/02/2022   Mixed hyperlipidemia 04/02/2022   History of panic attacks 04/02/2022   Pain in finger of left hand 07/30/2021   Encounter for orthopedic follow-up care 07/23/2021   Inflammatory pain 06/26/2021   Lichen sclerosus 05/07/2020   Menopausal symptom 05/07/2020   Osteoarthritis of left knee 02/22/2019   Morbid obesity with BMI of 40.0-44.9, adult (HCC) 01/04/2019   Breast cancer screening, high  risk patient 05/24/2018   BRCA1 gene mutation positive    Family history of breast cancer    Hypothyroidism 06/17/2014   Postpartum care following cesarean delivery (4/2) 06/15/2014   Born by cesarean section 06/15/2014   Cesarean delivery delivered 06/15/2014   Internal bleeding hemorrhoids 10/24/2012       REFERRING PROVIDER: [name], MD  REFERRING DIAG: s/p Bilateral Mastectomies for  BRCA  THERAPY DIAG:  Postoperative state  BRCA1 positive  Stiffness of right shoulder, not elsewhere classified  Stiffness of left shoulder, not elsewhere classified  ONSET DATE: 04/06/2023  Rationale for Evaluation and Treatment: Rehabilitation  SUBJECTIVE:                                                                                                                                                                                           SUBJECTIVE STATEMENT: 06/02/2023  I am keeping up with the exercises. Its easier to wash my hair now.  EVAL  Incisions  on both sides are doing well now. My ROM is better, but there are certain things I can't do. Reaching overhead and back is hard.  Moving quickly can be uncomfortable, and gets sore afterwards. The expander gets uncomfortable in certain positions. The soonest she can have the expander on the right side is mid May but her oldest child graduates June 7, so a lot will depend on the date she is offered.  PERTINENT HISTORY:  Pt underwent prophylactic Bilateral  nipple-sparing mastectomies on April 06, 2023, with tissue expanders placed due to BRCA 1+  Shortly thereafter she  noticed discoloration of her nipples and developed a fever. Her condition worsened with open sores and necrotic tissue. She had  a second surgery on 04/22/2023 to remove both nipples and the right tissue expander. She has a tissue expander on the left side, which has healed well. She had breast lifts in 12/2022 and a tummy tuck as she lost 100 lbs recently. She had bilateral Salpingo-oophorectomies on 10/30/2018.   PAIN:  Are you having pain? None presently NPRS scale: 0/10-3/10 worst with quick movements. Pain location: left greater than right Pain orientation: Bilateral  PAIN TYPE: sharp and tight Pain description: intermittent  Aggravating factors: reaching high, taking off a fitted shirt, doesn't lay on left side Relieving factors: avoiding extremes  of reaching.  PRECAUTIONS: BRCA1+, hypothyroidism  RED FLAGS: None   WEIGHT BEARING RESTRICTIONS: No  FALLS:  Has patient fallen in last 6 months? No  LIVING ENVIRONMENT: Lives with: lives with their family, husband and 3 children 20, 40, and 8  Lives in: House/apartment    OCCUPATION: IT project mgr  LEISURE: walking, going to sporting events  HAND DOMINANCE: right   PRIOR LEVEL OF FUNCTION: Independent  PATIENT GOALS: To have normal rROM   OBJECTIVE: Note: Objective measures were completed at Evaluation unless otherwise noted.  COGNITION: Overall cognitive status: Within functional limits for tasks assessed   PALPATION: No tenderness UT  OBSERVATIONS / OTHER ASSESSMENTS: incisions healing;prior breast lift, small scab present on left breast incision. Tiny scab on right. Lower excisions long and extending under the arms Left breast with expander present, right expander removed in second surgery when nipples were removed  SENSATION: Light touch: Deficits      POSTURE: forward head, rounded shoulders   UPPER EXTREMITY AROM/PROM:  A/PROM RIGHT   eval   Shoulder extension 70  Shoulder flexion 135  Shoulder abduction 94  Shoulder internal rotation NT  Shoulder external rotation NT    (Blank rows = not tested)  A/PROM LEFT   eval  Shoulder extension 70  Shoulder flexion 151  Shoulder abduction 110  Shoulder internal rotation 75  Shoulder external rotation 83    (Blank rows = not tested)  CERVICAL AROM: All within normal limits:     UPPER EXTREMITY STRENGTH:   LYMPHEDEMA ASSESSMENTS:   SURGERY TYPE/DATE: 04/06/2023 Bilateral nIpple Sparing mastectomies with immediate Reconstruction, 04/22/2023 Revision with removal of Right tissue expander and nipples  NUMBER OF LYMPH NODES REMOVED: 0  CHEMOTHERAPY: No  RADIATION:NO  HORMONE TREATMENT: NO  INFECTIONS: YES    FUNCTIONAL TESTS:    GAIT: WNL     QUICK DASH SURVEY: 36%                                                                                                                             TREATMENT DATE:  06/02/2023 Ball rolls on wall x 10 forward, 7 lateral B Half foam roll:AROM bilateral shoulder flexion, scaption, horizontal abduction, snow angels x6 Supine scapular series x 10 ea with yellow Pec wall stretch Updated HEP with supine scap series and gave yellow band PROM bilateral shoulder flexion, scaption, abduction, IR and ER Pt advised to actively mimic the motions of taking off a shirt for strength and ROM since that is still difficult 05/31/2023 Overhead pulleys flexion, scaption, abd x 2:30 min ea Ball rolls on wall x 10 flexion, x 5 abd bilaterally 3 D AROM bilateral flexion, scaption,  horizontal , snow angels x 5 Scar massage to right medial and lateral incision avoiding T zone PROM bilateral shoulder flexion, scaption, abd, IR and ER  05/26/2023 Overhead pulleys flexion, scaption, abd x 2:30 min ea Ball rolls on wall x 10 flexion, x 5 abd bilaterally STM bilateral UT, levator, pectorals with cocoa butter PROM bilateral shoulder flexion, scaption, abd, IR and ER  05/23/2023 Pt was educated in 4 post op exercises; Supine flexion, stargazer, sitting or standing scapular retraction and standing wall slides. She performed each x 5 reps holding 5 seconds. HEP was updated with pictures   PATIENT  EDUCATION:  Education details: 4 post op exercises, LOS, POC, treatment interventions Person educated: Patient Education method: Explanation, Demonstration, and Handouts Education comprehension: verbalized understanding and returned demonstration  HOME EXERCISE PROGRAM: 4 post op exercises  ASSESSMENT:  CLINICAL IMPRESSION:  Pt is progressing nicely. Initiated half foam roll today, and supine scapular series. She used good form with very few cues and no complaints of pain.  OBJECTIVE IMPAIRMENTS: decreased activity tolerance, decreased knowledge of  condition, decreased ROM, decreased strength, postural dysfunction, and pain.   ACTIVITY LIMITATIONS: lifting, sleeping, dressing, and reach over head  PARTICIPATION LIMITATIONS: cleaning and activities requiring reaching  PERSONAL FACTORS: 1 comorbidity: BRCA1+ s/p bilateral mastectomies with tissue expanders, and second surgery to remove right TE, nipples  are also affecting patient's functional outcome.   REHAB POTENTIAL: Excellent  CLINICAL DECISION MAKING: Stable/uncomplicated  EVALUATION COMPLEXITY: Low  GOALS: Goals reviewed with patient? Yes  /SHORT TERM GOALS= LONG TERM GOALS: Target date: 06/20/2023  Pt will be independent in  a HEP to improve shoulder ROM and strength Baseline: Goal status: INITIAL  2.  Pt will demonstrate return to full or near full ROM without increased pain Baseline:  Goal status: INITIAL  3.  Quick dash will improve to no greater than 12% to demonstrate improved function Baseline:  Goal status: INITIAL  4.  Pt will be able to pull off a tight shirt without difficulty Baseline:  Goal status: INITIAL  5.  Pt will be independent in more advanced HEP to include strengthening  Goal status: Initial   PLAN:  PT FREQUENCY: 1-2x/week  PT DURATION: 4 weeks  PLANNED INTERVENTIONS: 97164- PT Re-evaluation, 97110-Therapeutic exercises, 97530- Therapeutic activity, 97112- Neuromuscular re-education, 97535- Self Care, and 52841- Manual therapy  PLAN FOR NEXT SESSION: review theraband next, Jobes flex and scaption standing,foam roll pec wall stretch, progress to strength when ready, scar massage on right  Alvira Monday, PT 06/02/23 8:53 AM

## 2023-06-02 NOTE — Patient Instructions (Signed)
 Over Head Pull: Narrow and Wide Grip   Cancer Rehab [phone]   On back, knees bent, feet flat, band across thighs, elbows straight but relaxed. Pull hands apart (start). Keeping elbows straight, bring arms up and over head, hands toward floor. Keep pull steady on band. Hold momentarily. Return slowly, keeping pull steady, back to start. Then do same with a wider grip on the band (past shoulder width) Repeat _5-10__ times. Band color __yellow____   Side Pull: Double Arm   On back, knees bent, feet flat. Arms perpendicular to body, shoulder level, elbows straight but relaxed. Pull arms out to sides, elbows straight. Resistance band comes across collarbones, hands toward floor. Hold momentarily. Slowly return to starting position. Repeat _5-10__ times. Band color _yellow____   Sword   On back, knees bent, feet flat, left hand on left hip, right hand above left. Pull right arm DIAGONALLY (hip to shoulder) across chest. Bring right arm along head toward floor. Hold momentarily. Slowly return to starting position. Repeat _5-10__ times. Do with left arm. Band color _yellow_____   Shoulder Rotation: Double Arm   On back, knees bent, feet flat, elbows tucked at sides, bent 90, hands palms up. Pull hands apart and down toward floor, keeping elbows near sides. Hold momentarily. Slowly return to starting position. Repeat _5-10__ times. Band color __yellow____

## 2023-06-06 ENCOUNTER — Ambulatory Visit

## 2023-06-06 DIAGNOSIS — Z1501 Genetic susceptibility to malignant neoplasm of breast: Secondary | ICD-10-CM | POA: Diagnosis not present

## 2023-06-06 DIAGNOSIS — Z9889 Other specified postprocedural states: Secondary | ICD-10-CM

## 2023-06-06 DIAGNOSIS — M25612 Stiffness of left shoulder, not elsewhere classified: Secondary | ICD-10-CM

## 2023-06-06 DIAGNOSIS — M25611 Stiffness of right shoulder, not elsewhere classified: Secondary | ICD-10-CM

## 2023-06-06 NOTE — Therapy (Signed)
 OUTPATIENT PHYSICAL THERAPY  UPPER EXTREMITY ONCOLOGY TREATMENT  Patient Name: [name] MRN: [id number] DOB:07/28/1975, 48 y.o., female Today's Date: 06/06/2023  END OF SESSION:  PT End of Session - 06/06/23 1159     Visit Number 5    Number of Visits 8    Date for PT Re-Evaluation 06/20/23    Authorization Type BC[employer]    Authorization Time Period no Auth necessary    PT Start Time 1200    PT Stop Time 1255    PT Time Calculation (min) 55 min    Activity Tolerance Patient tolerated treatment well    Behavior During Therapy [facility] for tasks assessed/performed             Past Medical History:  Diagnosis Date   Anxiety 2011   BRCA1 gene mutation positive    dx 01/ 2020   Encounter for weight management 07/12/2022   Family history of breast cancer    Gestational diabetes mellitus, class A2 06/17/2014   Gestational thrombocytopenia (HCC) 06/17/2014   Hemorrhoids    History of gestational diabetes    Hypothyroidism    followed by dr Billy Coast   Mixed hyperlipidemia 04/02/2022   Past Surgical History:  Procedure Laterality Date   BREAST RECONSTRUCTION WITH PLACEMENT OF TISSUE EXPANDER AND FLEX HD (ACELLULAR HYDRATED DERMIS) Bilateral 04/06/2023   Procedure: BILATERAL BREAST RECONSTRUCTION WITH PLACEMENT OF TISSUE EXPANDER AND FLEX HD (ACELLULAR HYDRATED DERMIS);  Surgeon: [name], MD;  Location: [facility];  Service: Plastics;  Laterality: Bilateral;   BREAST RECONSTRUCTION WITH PLACEMENT OF TISSUE EXPANDER AND FLEX HD (ACELLULAR HYDRATED DERMIS) Bilateral 04/22/2023   Procedure: REVISION OF BREAST MASTECTOMY WITH DEBRIDMENT AND REMOVAL OF RIGHT TISSUE EXPANDER AND FLEX HD (ACELLULAR HYDRATED DERMIS);  Surgeon: [name], MD;  Location: [facility];  Service: Plastics;  Laterality: Bilateral;   CESAREAN SECTION  2007 & 2010   x 2 - at [facility]   CESAREAN SECTION WITH BILATERAL TUBAL LIGATION Bilateral 06/15/2014   Procedure: REPEAT  CESAREAN SECTION WITH BILATERAL TUBAL LIGATION;  Surgeon: Olivia Mackie, MD;  Location: [facility];  Service: Obstetrics;  Laterality: Bilateral;  EDD: 06/19/14   COLONOSCOPY WITH PROPOFOL  08/31/2012   NIPPLE SPARING MASTECTOMY Bilateral 04/06/2023   Procedure: BILATERAL NIPPLE SPARING MASTECTOMY;  Surgeon: [name], MD;  Location: [facility];  Service: General;  Laterality: Bilateral;  PEC BLOCK   ROBOTIC ASSISTED SALPINGO OOPHERECTOMY Right 10/30/2018   Procedure: XI ROBOTIC ASSISTED SALPINGO OOPHORECTOMY/With Lysis of Adhesions;  Surgeon: Olivia Mackie, MD;  Location: [facility];  Service: Gynecology;  Laterality: Right;  Requests 2 hrs.   TRIGGER FINGER RELEASE Left    TUBAL LIGATION  2016   wisdom teeth ext  teen   Patient Active Problem List   Diagnosis Date Noted   BRCA positive 04/06/2023   External hemorrhoids without complication 03/15/2023   Morbid obesity (HCC) 03/15/2023   History of mastopexy 01/06/2023   Encounter for weight management 07/12/2022   History of gestational diabetes 04/02/2022   High coronary artery calcium score 04/02/2022   Mixed hyperlipidemia 04/02/2022   History of panic attacks 04/02/2022   Pain in finger of left hand 07/30/2021   Encounter for orthopedic follow-up care 07/23/2021   Inflammatory pain 06/26/2021   Lichen sclerosus 05/07/2020   Menopausal symptom 05/07/2020   Osteoarthritis of left knee 02/22/2019   Morbid obesity with BMI of 40.0-44.9, adult (HCC) 01/04/2019   Breast cancer screening, high  risk patient 05/24/2018   BRCA1 gene mutation positive    Family history of breast cancer    Hypothyroidism 06/17/2014   Postpartum care following cesarean delivery (4/2) 06/15/2014   Born by cesarean section 06/15/2014   Cesarean delivery delivered 06/15/2014   Internal bleeding hemorrhoids 10/24/2012       REFERRING PROVIDER: [name], MD  REFERRING DIAG: s/p Bilateral Mastectomies for  BRCA  THERAPY DIAG:  Postoperative state  BRCA1 positive  Stiffness of right shoulder, not elsewhere classified  Stiffness of left shoulder, not elsewhere classified  ONSET DATE: 04/06/2023  Rationale for Evaluation and Treatment: Rehabilitation  SUBJECTIVE:                                                                                                                                                                                           SUBJECTIVE STATEMENT: 06/06/2023  I can really feel a difference in my tightness. I have to cancel Friday to go to [facility] with my son. Doing better with taking off shirts. Pain is definitely improved and the tightness.  EVAL  Incisions  on both sides are doing well now. My ROM is better, but there are certain things I can't do. Reaching overhead and back is hard.  Moving quickly can be uncomfortable, and gets sore afterwards. The expander gets uncomfortable in certain positions. The soonest she can have the expander on the right side is mid May but her oldest child graduates June 7, so a lot will depend on the date she is offered.  PERTINENT HISTORY:  Pt underwent prophylactic Bilateral  nipple-sparing mastectomies on April 06, 2023, with tissue expanders placed due to BRCA 1+  Shortly thereafter she  noticed discoloration of her nipples and developed a fever. Her condition worsened with open sores and necrotic tissue. She had  a second surgery on 04/22/2023 to remove both nipples and the right tissue expander. She has a tissue expander on the left side, which has healed well. She had breast lifts in 12/2022 and a tummy tuck as she lost 100 lbs recently. She had bilateral Salpingo-oophorectomies on 10/30/2018.   PAIN:  Are you having pain? None presently NPRS scale: 0/10-3/10 worst with quick movements. Pain location: left greater than right Pain orientation: Bilateral  PAIN TYPE: sharp and tight Pain description: intermittent  Aggravating  factors: reaching high, taking off a fitted shirt, doesn't lay on left side Relieving factors: avoiding extremes of reaching.  PRECAUTIONS: BRCA1+, hypothyroidism  RED FLAGS: None   WEIGHT BEARING RESTRICTIONS: No  FALLS:  Has patient fallen in last 6 months? No  LIVING ENVIRONMENT: Lives with: lives with their  family, husband and 3 children 59, 25, and 8  Lives in: House/apartment    OCCUPATION: IT project mgr    LEISURE: walking, going to sporting events  HAND DOMINANCE: right   PRIOR LEVEL OF FUNCTION: Independent  PATIENT GOALS: To have normal rROM   OBJECTIVE: Note: Objective measures were completed at Evaluation unless otherwise noted.  COGNITION: Overall cognitive status: Within functional limits for tasks assessed   PALPATION: No tenderness UT  OBSERVATIONS / OTHER ASSESSMENTS: incisions healing;prior breast lift, small scab present on left breast incision. Tiny scab on right. Lower excisions long and extending under the arms Left breast with expander present, right expander removed in second surgery when nipples were removed  SENSATION: Light touch: Deficits      POSTURE: forward head, rounded shoulders   UPPER EXTREMITY AROM/PROM:  A/PROM RIGHT   eval   Shoulder extension 70  Shoulder flexion 135  Shoulder abduction 94  Shoulder internal rotation NT  Shoulder external rotation NT    (Blank rows = not tested)  A/PROM LEFT   eval  Shoulder extension 70  Shoulder flexion 151  Shoulder abduction 110  Shoulder internal rotation 75  Shoulder external rotation 83    (Blank rows = not tested)  CERVICAL AROM: All within normal limits:     UPPER EXTREMITY STRENGTH:   LYMPHEDEMA ASSESSMENTS:   SURGERY TYPE/DATE: 04/06/2023 Bilateral nIpple Sparing mastectomies with immediate Reconstruction, 04/22/2023 Revision with removal of Right tissue expander and nipples  NUMBER OF LYMPH NODES REMOVED: 0  CHEMOTHERAPY: No  RADIATION:NO  HORMONE  TREATMENT: NO  INFECTIONS: YES    FUNCTIONAL TESTS:    GAIT: WNL     QUICK DASH SURVEY: 36%                                                                                                                            TREATMENT DATE:   06/06/2023 Pulleys x 2 min flexion and abduction Ball rolls on wall x 2 min flex and abd B x 5 Yellow TB standing: scapular retraction, shoulder extension, ER x 10 ea Jobes flexion and scaption 2# x10 B Corner chest stretch x 3 20-30 sec, single arm wall stretch x 1 Updated HEP with pics of postural exercises and jobes, and chest stretches Scar massage to right medial and lateral incision avoiding T zone PROM bilateral shoulder flexion, scaption, abd, IR and ER  06/02/2023 Ball rolls on wall x 10 forward, 7 lateral B Half foam roll:AROM bilateral shoulder flexion, scaption, horizontal abduction, snow angels x6 Supine scapular series x 10 ea with yellow Pec wall stretch Updated HEP with supine scap series and gave yellow band PROM bilateral shoulder flexion, scaption, abduction, IR and ER Pt advised to actively mimic the motions of taking off a shirt for strength and ROM since that is still difficult 05/31/2023 Overhead pulleys flexion, scaption, abd x 2:30 min ea Ball rolls on wall x 10 flexion, x 5 abd bilaterally 3 D  AROM bilateral flexion, scaption,  horizontal , snow angels x 5 Scar massage to right medial and lateral incision avoiding T zone PROM bilateral shoulder flexion, scaption, abd, IR and ER  05/26/2023 Overhead pulleys flexion, scaption, abd x 2:30 min ea Ball rolls on wall x 10 flexion, x 5 abd bilaterally STM bilateral UT, levator, pectorals with cocoa butter PROM bilateral shoulder flexion, scaption, abd, IR and ER  05/23/2023 Pt was educated in 4 post op exercises; Supine flexion, stargazer, sitting or standing scapular retraction and standing wall slides. She performed each x 5 reps holding 5 seconds. HEP was updated with  pictures   PATIENT EDUCATION:  Education details: 4 post op exercises, LOS, POC, treatment interventions Person educated: Patient Education method: Explanation, Demonstration, and Handouts Education comprehension: verbalized understanding and returned demonstration  HOME EXERCISE PROGRAM: 4 post op exercises  ASSESSMENT:  CLINICAL IMPRESSION:  Pt did very well with new exercises instructed, using good form and without pain. She may be ready for DC next week. She has full PROM.  OBJECTIVE IMPAIRMENTS: decreased activity tolerance, decreased knowledge of condition, decreased ROM, decreased strength, postural dysfunction, and pain.   ACTIVITY LIMITATIONS: lifting, sleeping, dressing, and reach over head  PARTICIPATION LIMITATIONS: cleaning and activities requiring reaching  PERSONAL FACTORS: 1 comorbidity: BRCA1+ s/p bilateral mastectomies with tissue expanders, and second surgery to remove right TE, nipples  are also affecting patient's functional outcome.   REHAB POTENTIAL: Excellent  CLINICAL DECISION MAKING: Stable/uncomplicated  EVALUATION COMPLEXITY: Low  GOALS: Goals reviewed with patient? Yes  /SHORT TERM GOALS= LONG TERM GOALS: Target date: 06/20/2023  Pt will be independent in  a HEP to improve shoulder ROM and strength Baseline: Goal status: MET 06/06/2023 2.  Pt will demonstrate return to full or near full ROM without increased pain Baseline:  Goal status: INITIAL  3.  Quick dash will improve to no greater than 12% to demonstrate improved function Baseline:  Goal status: INITIAL  4.  Pt will be able to pull off a tight shirt without difficulty Baseline:  Goal status: INITIAL  5.  Pt will be independent in more advanced HEP to include strengthening  Goal status: Initial   PLAN:  PT FREQUENCY: 1-2x/week  PT DURATION: 4 weeks  PLANNED INTERVENTIONS: 97164- PT Re-evaluation, 97110-Therapeutic exercises, 97530- Therapeutic activity, 97112- Neuromuscular  re-education, 97535- Self Care, and 91478- Manual therapy  PLAN FOR NEXT SESSION:  Quick dash, check goals,review theraband next, Jobes flex and scaption standing,foam roll pec wall stretch, progress to strength when ready, scar massage on right  Alvira Monday, PT 06/06/23 12:58 PM

## 2023-06-08 ENCOUNTER — Inpatient Hospital Stay (HOSPITAL_BASED_OUTPATIENT_CLINIC_OR_DEPARTMENT_OTHER): Admitting: Obstetrics & Gynecology

## 2023-06-08 VITALS — BP 117/74 | HR 100 | Temp 98.0°F | Resp 18 | Ht 63.0 in | Wt 126.0 lb

## 2023-06-08 DIAGNOSIS — Z1509 Genetic susceptibility to other malignant neoplasm: Secondary | ICD-10-CM | POA: Diagnosis not present

## 2023-06-08 DIAGNOSIS — Z1501 Genetic susceptibility to malignant neoplasm of breast: Secondary | ICD-10-CM | POA: Diagnosis not present

## 2023-06-08 DIAGNOSIS — Z803 Family history of malignant neoplasm of breast: Secondary | ICD-10-CM | POA: Diagnosis not present

## 2023-06-08 DIAGNOSIS — E785 Hyperlipidemia, unspecified: Secondary | ICD-10-CM | POA: Diagnosis not present

## 2023-06-08 DIAGNOSIS — E039 Hypothyroidism, unspecified: Secondary | ICD-10-CM | POA: Diagnosis not present

## 2023-06-08 DIAGNOSIS — E8941 Symptomatic postprocedural ovarian failure: Secondary | ICD-10-CM | POA: Diagnosis not present

## 2023-06-08 DIAGNOSIS — Z79890 Hormone replacement therapy (postmenopausal): Secondary | ICD-10-CM

## 2023-06-08 DIAGNOSIS — R634 Abnormal weight loss: Secondary | ICD-10-CM | POA: Diagnosis not present

## 2023-06-08 DIAGNOSIS — E8940 Asymptomatic postprocedural ovarian failure: Secondary | ICD-10-CM

## 2023-06-08 DIAGNOSIS — Z90722 Acquired absence of ovaries, bilateral: Secondary | ICD-10-CM

## 2023-06-08 DIAGNOSIS — Z9013 Acquired absence of bilateral breasts and nipples: Secondary | ICD-10-CM

## 2023-06-08 DIAGNOSIS — Z1502 Genetic susceptibility to malignant neoplasm of ovary: Secondary | ICD-10-CM | POA: Diagnosis not present

## 2023-06-08 DIAGNOSIS — Z806 Family history of leukemia: Secondary | ICD-10-CM | POA: Diagnosis not present

## 2023-06-08 DIAGNOSIS — Z79899 Other long term (current) drug therapy: Secondary | ICD-10-CM | POA: Diagnosis not present

## 2023-06-08 MED ORDER — ESTRADIOL 0.1 MG/24HR TD PTWK: 0.1000 mg | MEDICATED_PATCH | TRANSDERMAL | 3 refills | Status: AC

## 2023-06-08 MED ORDER — PROGESTERONE MICRONIZED 100 MG PO CAPS
100.0000 mg | ORAL_CAPSULE | Freq: Every day | ORAL | 1 refills | Status: DC
Start: 2023-06-08 — End: 2023-11-09

## 2023-06-08 NOTE — Progress Notes (Unsigned)
 Assessment/Plan    Hormone Replacement Therapy (HRT) Consideration Discussed HRT risks and benefits, emphasizing cardiovascular benefits and reduced breast cancer risk post-mastectomy.  No obvious contraindications; ACVD 10 yr risk is low at 0.3%.Recommended transdermal estrogen for lower thromboembolism risk, considering her dyslipidemia history and levothyroxine use. Preferred Prometrium for uterine protection due to her surgical schedule and adherence history. - Prescribe high-dose transdermal estrogen patch. - Prescribe Prometrium daily for uterine protection. - Order bone mineral density test. - Education materials provided - Provide [email or web address] resources for menopause management.  Surgical Menopause Discussed HRT benefits for menopausal symptom relief and quality of life improvement post-oophorectomy.  BRCA1 Mutation BRCA1 mutation guided risk-reducing surgeries and HRT considerations, with reduced breast cancer risk post-mastectomy. Reviewed increased risk for uterine malignancy.  She was counseled that pts sometimes elect to proceed w/hysterectomy to also simplify the HRT regimen.  Hyperlipidemia Managed with rosuvastatin, normalizing cholesterol levels. Considered in HRT context due to cardiovascular risk.  Follow-up Plan to assess HRT effectiveness and tolerability. - Schedule phone or virtual visit in three months to evaluate HRT effectiveness and tolerability.  -Recommend establishing consistent long term follow-up with a generalist gynecologist  I personally spent 30 minutes face-to-face and non-face-to-face in the care of this patient, which includes all pre, intra, and post visit time on the date of service.      Patient ID: [name], female   DOB: 11/03/1975, 48 y.o.   MRN: [id number]  Chief Complaint  Patient presents with   BRCA1 gene mutation positive    HPI [name] is a 48 y.o. female with a BRCA1 mutation who presents for discussion of  hormone replacement therapy (HRT).  She is seen at the request of Allwardt, Alyssa M, PA-C.  She has a BRCA1 mutation and has undergone risk-reducing mastectomies and oophorectomy. She was born with only one ovary, which was discovered during her first C-section. She has never been on hormone therapy and is interested in HRT for cardiovascular and other benefits.  She has experienced significant lifestyle changes following surgical menopause, including losing over 100 pounds through diet and exercise. She experiences ongoing menopausal symptoms, including hot flashes and sleep disturbances, which have improved somewhat since stopping gabapentin, which she took for two years. She continues to experience hot flashes and wakes up at 2 AM 'dripping wet.'  She has a history of high cholesterol, for which she has been taking rosuvastatin for about a year, resulting in normal cholesterol levels. No history of high blood pressure. She is concerned about her cardiovascular risk due to a strong family history of heart disease; her father and his siblings either died of heart attacks or had open-heart surgery, and her father had surgery in his fifties. She does not smoke and maintains an active lifestyle, exercising over an hour daily.  She is currently on levothyroxine for thyroid management and uses vaginal estrogen. She has not had a baseline bone density test since her surgical menopause. She does not drink alcohol and has a history of obesity, which she has since addressed through lifestyle changes.    HPI  Past Medical History:  Diagnosis Date   Anxiety 2011   BRCA1 gene mutation positive    dx 01/ 2020   Encounter for weight management 07/12/2022   Family history of breast cancer    Gestational diabetes mellitus, class A2 06/17/2014   Gestational thrombocytopenia (HCC) 06/17/2014   Hemorrhoids    History of gestational diabetes    Hypothyroidism  followed by dr Billy Coast   Mixed hyperlipidemia  04/02/2022    Past Surgical History:  Procedure Laterality Date   BREAST RECONSTRUCTION WITH PLACEMENT OF TISSUE EXPANDER AND FLEX HD (ACELLULAR HYDRATED DERMIS) Bilateral 04/06/2023   Procedure: BILATERAL BREAST RECONSTRUCTION WITH PLACEMENT OF TISSUE EXPANDER AND FLEX HD (ACELLULAR HYDRATED DERMIS);  Surgeon: [name], MD;  Location: [facility];  Service: Plastics;  Laterality: Bilateral;   BREAST RECONSTRUCTION WITH PLACEMENT OF TISSUE EXPANDER AND FLEX HD (ACELLULAR HYDRATED DERMIS) Bilateral 04/22/2023   Procedure: REVISION OF BREAST MASTECTOMY WITH DEBRIDMENT AND REMOVAL OF RIGHT TISSUE EXPANDER AND FLEX HD (ACELLULAR HYDRATED DERMIS);  Surgeon: [name], MD;  Location: [facility];  Service: Plastics;  Laterality: Bilateral;   CESAREAN SECTION  2007 & 2010   x 2 - at [facility]   CESAREAN SECTION WITH BILATERAL TUBAL LIGATION Bilateral 06/15/2014   Procedure: REPEAT CESAREAN SECTION WITH BILATERAL TUBAL LIGATION;  Surgeon: Olivia Mackie, MD;  Location: [facility];  Service: Obstetrics;  Laterality: Bilateral;  EDD: 06/19/14   COLONOSCOPY WITH PROPOFOL  08/31/2012   NIPPLE SPARING MASTECTOMY Bilateral 04/06/2023   Procedure: BILATERAL NIPPLE SPARING MASTECTOMY;  Surgeon: [name], MD;  Location: [facility];  Service: General;  Laterality: Bilateral;  PEC BLOCK   ROBOTIC ASSISTED SALPINGO OOPHERECTOMY Right 10/30/2018   Procedure: XI ROBOTIC ASSISTED SALPINGO OOPHORECTOMY/With Lysis of Adhesions;  Surgeon: Olivia Mackie, MD;  Location: [facility];  Service: Gynecology;  Laterality: Right;  Requests 2 hrs.   TRIGGER FINGER RELEASE Left    TUBAL LIGATION  2016   wisdom teeth ext  teen    Family History  Problem Relation Age of Onset   Diabetes Mother    Breast cancer Mother        dx 28 and 36, BRCA1+   Colon polyps Mother    Fibromyalgia Mother    Cancer Mother    Obesity Mother    Varicose Veins Mother     Kidney disease Father    Heart disease Father    Hypertension Father    Alcohol abuse Father    Obesity Father    Stroke Father    Diabetes Sister    Obesity Sister    Cancer Maternal Grandmother        breast dx 52s, d. 79s   Breast cancer Paternal Aunt        dx 106s   Leukemia Cousin        dx childhood    Social History Social History   Tobacco Use   Smoking status: Never   Smokeless tobacco: Never  Vaping Use   Vaping status: Never Used  Substance Use Topics   Alcohol use: Not Currently   Drug use: No    No Known Allergies  Current Outpatient Medications  Medication Sig Dispense Refill   ALPRAZolam (XANAX) 0.5 MG tablet Take 1 tablet (0.5 mg total) by mouth at bedtime as needed for anxiety. 10 tablet 0   estradiol (CLIMARA) 0.1 mg/24hr patch Place 1 patch (0.1 mg total) onto the skin once a week for 12 doses. 12 patch 3   Estradiol (VAGIFEM) 10 MCG TABS vaginal tablet Place vaginally. Twice a week     levothyroxine (SYNTHROID) 88 MCG tablet TAKE 1 TABLET BY MOUTH EVERY MORNING. 90 tablet 1   progesterone (PROMETRIUM) 100 MG capsule Take 1 capsule (100 mg total) by mouth daily. 90 capsule 1   rosuvastatin (CRESTOR) 5 MG tablet Take  1 tablet (5 mg total) by mouth daily. 90 tablet 3   tirzepatide (ZEPBOUND) 5 MG/0.5ML Pen Inject 5 mg into the skin once a week.     tretinoin (RETIN-A) 0.05 % cream Apply topically at bedtime. 45 g 3   No current facility-administered medications for this visit.    Review of Systems Review of Systems Constitutional: negative for fatigue and weight loss Respiratory: negative for cough and wheezing Cardiovascular: negative for chest pain, fatigue and palpitations Gastrointestinal: negative for abdominal pain and change in bowel habits Genitourinary:negative for abnormal discharge Integument/breast: negative for nipple discharge Musculoskeletal:negative for myalgias Neurological: negative for gait problems and  tremors Behavioral/Psych: negative for abusive relationship, depression Endocrine: negative for temperature intolerance      Blood pressure 117/74, pulse 100, temperature 98 F (36.7 C), temperature source Oral, resp. rate 18, height 5\' 3"  (1.6 m), weight 126 lb (57.2 kg), SpO2 100%.  Physical Exam Deferred

## 2023-06-08 NOTE — Patient Instructions (Signed)
 [email or web address]  Virtual video visit or phone visit in 3 months

## 2023-06-10 ENCOUNTER — Encounter: Payer: Self-pay | Admitting: Obstetrics & Gynecology

## 2023-06-10 ENCOUNTER — Encounter

## 2023-06-13 ENCOUNTER — Encounter: Payer: BC Managed Care – PPO | Admitting: Physician Assistant

## 2023-06-13 ENCOUNTER — Ambulatory Visit

## 2023-06-13 DIAGNOSIS — M25612 Stiffness of left shoulder, not elsewhere classified: Secondary | ICD-10-CM

## 2023-06-13 DIAGNOSIS — Z1501 Genetic susceptibility to malignant neoplasm of breast: Secondary | ICD-10-CM | POA: Diagnosis not present

## 2023-06-13 DIAGNOSIS — M25611 Stiffness of right shoulder, not elsewhere classified: Secondary | ICD-10-CM

## 2023-06-13 DIAGNOSIS — Z9889 Other specified postprocedural states: Secondary | ICD-10-CM

## 2023-06-13 DIAGNOSIS — Z1509 Genetic susceptibility to other malignant neoplasm: Secondary | ICD-10-CM

## 2023-06-13 NOTE — Therapy (Signed)
 OUTPATIENT PHYSICAL THERAPY  UPPER EXTREMITY ONCOLOGY TREATMENT  Patient Name: [name] MRN: [id number] DOB:17-May-1975, 48 y.o., female Today's Date: 06/13/2023  END OF SESSION:  PT End of Session - 06/13/23 0900     Visit Number 6    Number of Visits 8    Date for PT Re-Evaluation 06/20/23    Authorization Type BC[employer]    Authorization Time Period no Auth necessary    PT Start Time 0901    PT Stop Time 0957    PT Time Calculation (min) 56 min    Activity Tolerance Patient tolerated treatment well    Behavior During Therapy [facility] for tasks assessed/performed             Past Medical History:  Diagnosis Date   Anxiety 2011   BRCA1 gene mutation positive    dx 01/ 2020   Encounter for weight management 07/12/2022   Family history of breast cancer    Gestational diabetes mellitus, class A2 06/17/2014   Gestational thrombocytopenia (HCC) 06/17/2014   Hemorrhoids    History of gestational diabetes    Hypothyroidism    followed by dr Billy Coast   Mixed hyperlipidemia 04/02/2022   Past Surgical History:  Procedure Laterality Date   BREAST RECONSTRUCTION WITH PLACEMENT OF TISSUE EXPANDER AND FLEX HD (ACELLULAR HYDRATED DERMIS) Bilateral 04/06/2023   Procedure: BILATERAL BREAST RECONSTRUCTION WITH PLACEMENT OF TISSUE EXPANDER AND FLEX HD (ACELLULAR HYDRATED DERMIS);  Surgeon: [name], MD;  Location: [facility];  Service: Plastics;  Laterality: Bilateral;   BREAST RECONSTRUCTION WITH PLACEMENT OF TISSUE EXPANDER AND FLEX HD (ACELLULAR HYDRATED DERMIS) Bilateral 04/22/2023   Procedure: REVISION OF BREAST MASTECTOMY WITH DEBRIDMENT AND REMOVAL OF RIGHT TISSUE EXPANDER AND FLEX HD (ACELLULAR HYDRATED DERMIS);  Surgeon: [name], MD;  Location: [facility];  Service: Plastics;  Laterality: Bilateral;   CESAREAN SECTION  2007 & 2010   x 2 - at [facility]   CESAREAN SECTION WITH BILATERAL TUBAL LIGATION Bilateral 06/15/2014   Procedure: REPEAT  CESAREAN SECTION WITH BILATERAL TUBAL LIGATION;  Surgeon: Olivia Mackie, MD;  Location: [facility];  Service: Obstetrics;  Laterality: Bilateral;  EDD: 06/19/14   COLONOSCOPY WITH PROPOFOL  08/31/2012   NIPPLE SPARING MASTECTOMY Bilateral 04/06/2023   Procedure: BILATERAL NIPPLE SPARING MASTECTOMY;  Surgeon: [name], MD;  Location: [facility];  Service: General;  Laterality: Bilateral;  PEC BLOCK   ROBOTIC ASSISTED SALPINGO OOPHERECTOMY Right 10/30/2018   Procedure: XI ROBOTIC ASSISTED SALPINGO OOPHORECTOMY/With Lysis of Adhesions;  Surgeon: Olivia Mackie, MD;  Location: [facility];  Service: Gynecology;  Laterality: Right;  Requests 2 hrs.   TRIGGER FINGER RELEASE Left    TUBAL LIGATION  2016   wisdom teeth ext  teen   Patient Active Problem List   Diagnosis Date Noted   BRCA positive 04/06/2023   External hemorrhoids without complication 03/15/2023   Morbid obesity (HCC) 03/15/2023   History of mastopexy 01/06/2023   Encounter for weight management 07/12/2022   History of gestational diabetes 04/02/2022   High coronary artery calcium score 04/02/2022   Mixed hyperlipidemia 04/02/2022   History of panic attacks 04/02/2022   Pain in finger of left hand 07/30/2021   Encounter for orthopedic follow-up care 07/23/2021   Inflammatory pain 06/26/2021   Lichen sclerosus 05/07/2020   Menopausal symptom 05/07/2020   Osteoarthritis of left knee 02/22/2019   Morbid obesity with BMI of 40.0-44.9, adult (HCC) 01/04/2019   Breast cancer screening, high  risk patient 05/24/2018   BRCA1 gene mutation positive    Family history of breast cancer    Hypothyroidism 06/17/2014   Postpartum care following cesarean delivery (4/2) 06/15/2014   Born by cesarean section 06/15/2014   Cesarean delivery delivered 06/15/2014   Internal bleeding hemorrhoids 10/24/2012       REFERRING PROVIDER: [name], MD  REFERRING DIAG: s/p Bilateral Mastectomies for  BRCA  THERAPY DIAG:  Postoperative state  BRCA1 positive  Stiffness of right shoulder, not elsewhere classified  Stiffness of left shoulder, not elsewhere classified  ONSET DATE: 04/06/2023  Rationale for Evaluation and Treatment: Rehabilitation  SUBJECTIVE:                                                                                                                                                                                           SUBJECTIVE STATEMENT: 06/06/2023 Doing really well overall. I still have tightness when I stretch my chest.  I can take off pullover shirts now. I can reach to pull the ceiling fan string now without pain.  EVAL  Incisions  on both sides are doing well now. My ROM is better, but there are certain things I can't do. Reaching overhead and back is hard.  Moving quickly can be uncomfortable, and gets sore afterwards. The expander gets uncomfortable in certain positions. The soonest she can have the expander on the right side is mid May but her oldest child graduates June 7, so a lot will depend on the date she is offered.  PERTINENT HISTORY:  Pt underwent prophylactic Bilateral  nipple-sparing mastectomies on April 06, 2023, with tissue expanders placed due to BRCA 1+  Shortly thereafter she  noticed discoloration of her nipples and developed a fever. Her condition worsened with open sores and necrotic tissue. She had  a second surgery on 04/22/2023 to remove both nipples and the right tissue expander. She has a tissue expander on the left side, which has healed well. She had breast lifts in 12/2022 and a tummy tuck as she lost 100 lbs recently. She had bilateral Salpingo-oophorectomies on 10/30/2018.   PAIN:  Are you having pain? None presently NPRS scale: 0/10-3/10 worst with quick movements. Pain location: left greater than right Pain orientation: Bilateral  PAIN TYPE: sharp and tight Pain description: intermittent  Aggravating factors:  reaching high, taking off a fitted shirt, doesn't lay on left side Relieving factors: avoiding extremes of reaching.  PRECAUTIONS: BRCA1+, hypothyroidism  RED FLAGS: None   WEIGHT BEARING RESTRICTIONS: No  FALLS:  Has patient fallen in last 6 months? No  LIVING ENVIRONMENT: Lives with: lives with their family, husband  and 3 children 18, 14, and 8  Lives in: House/apartment    OCCUPATION: IT project mgr    LEISURE: walking, going to sporting events  HAND DOMINANCE: right   PRIOR LEVEL OF FUNCTION: Independent  PATIENT GOALS: To have normal rROM   OBJECTIVE: Note: Objective measures were completed at Evaluation unless otherwise noted.  COGNITION: Overall cognitive status: Within functional limits for tasks assessed   PALPATION: No tenderness UT  OBSERVATIONS / OTHER ASSESSMENTS: incisions healing;prior breast lift, small scab present on left breast incision. Tiny scab on right. Lower excisions long and extending under the arms Left breast with expander present, right expander removed in second surgery when nipples were removed  SENSATION: Light touch: Deficits      POSTURE: forward head, rounded shoulders   UPPER EXTREMITY AROM/PROM:  A/PROM RIGHT   eval  RIGHT 06/13/2023  Shoulder extension 70 77  Shoulder flexion 135 159  Shoulder abduction 94 175  Shoulder internal rotation NT 55  Shoulder external rotation NT 110    (Blank rows = not tested)  A/PROM LEFT   eval LEFT 3/05/14/2023  Shoulder extension 70 72  Shoulder flexion 151 165  Shoulder abduction 110 172  Shoulder internal rotation 75 75  Shoulder external rotation 83 98    (Blank rows = not tested)  CERVICAL AROM: All within normal limits:     UPPER EXTREMITY STRENGTH:   LYMPHEDEMA ASSESSMENTS:   SURGERY TYPE/DATE: 04/06/2023 Bilateral nIpple Sparing mastectomies with immediate Reconstruction, 04/22/2023 Revision with removal of Right tissue expander and nipples  NUMBER OF LYMPH NODES  REMOVED: 0  CHEMOTHERAPY: No  RADIATION:NO  HORMONE TREATMENT: NO  INFECTIONS: YES    FUNCTIONAL TESTS:    GAIT: WNL     QUICK DASH SURVEY: 36%                                                                                                                            TREATMENT DATE:   06/13/2023 Overhead pulleys x 2 min flexion and scaption. Half foam roll: Supine scaption with yellow x 10, supine horizontal abd x 5 yellow, 5 red, bilateral ER, sword x10 with red Snow angels on half foam roll x 10 Scar massage to right medial and lateral incision avoiding T zone PROM bilateral shoulder flexion, scaption, abd, IR and ER Measured bilateral AROM of shoulders Showed IR stretch behind back using opposite hand 06/06/2023 Pulleys x 2 min flexion and abduction Ball rolls on wall x 2 min flex and abd B x 5 Yellow TB standing: scapular retraction, shoulder extension, ER x 10 ea Jobes flexion and scaption 2# x10 B Corner chest stretch x 3 20-30 sec, single arm wall stretch x 1 Updated HEP with pics of postural exercises and jobes, and chest stretches Scar massage to right medial and lateral incision avoiding T zone PROM bilateral shoulder flexion, scaption, abd, IR and ER  06/02/2023 Ball rolls on wall x 10 forward, 7 lateral B Half  foam roll:AROM bilateral shoulder flexion, scaption, horizontal abduction, snow angels x6 Supine scapular series x 10 ea with yellow Pec wall stretch Updated HEP with supine scap series and gave yellow band PROM bilateral shoulder flexion, scaption, abduction, IR and ER Pt advised to actively mimic the motions of taking off a shirt for strength and ROM since that is still difficult 05/31/2023 Overhead pulleys flexion, scaption, abd x 2:30 min ea Ball rolls on wall x 10 flexion, x 5 abd bilaterally 3 D AROM bilateral flexion, scaption,  horizontal , snow angels x 5 Scar massage to right medial and lateral incision avoiding T zone PROM bilateral  shoulder flexion, scaption, abd, IR and ER  05/26/2023 Overhead pulleys flexion, scaption, abd x 2:30 min ea Ball rolls on wall x 10 flexion, x 5 abd bilaterally STM bilateral UT, levator, pectorals with cocoa butter PROM bilateral shoulder flexion, scaption, abd, IR and ER  05/23/2023 Pt was educated in 4 post op exercises; Supine flexion, stargazer, sitting or standing scapular retraction and standing wall slides. She performed each x 5 reps holding 5 seconds. HEP was updated with pictures   PATIENT EDUCATION:  Education details: 4 post op exercises, LOS, POC, treatment interventions Person educated: Patient Education method: Explanation, Demonstration, and Handouts Education comprehension: verbalized understanding and returned demonstration  HOME EXERCISE PROGRAM: 4 post op exercises  ASSESSMENT:  CLINICAL IMPRESSION:  Pt did well with supine scapular series on half foam roll but required occasional TC's mainly for sword exercise initially. Good improvement in AROM of shoulders but IR on right still limited. Incisions softer and more pliable. Small scab still present left chest.  OBJECTIVE IMPAIRMENTS: decreased activity tolerance, decreased knowledge of condition, decreased ROM, decreased strength, postural dysfunction, and pain.   ACTIVITY LIMITATIONS: lifting, sleeping, dressing, and reach over head  PARTICIPATION LIMITATIONS: cleaning and activities requiring reaching  PERSONAL FACTORS: 1 comorbidity: BRCA1+ s/p bilateral mastectomies with tissue expanders, and second surgery to remove right TE, nipples  are also affecting patient's functional outcome.   REHAB POTENTIAL: Excellent  CLINICAL DECISION MAKING: Stable/uncomplicated  EVALUATION COMPLEXITY: Low  GOALS: Goals reviewed with patient? Yes  /SHORT TERM GOALS= LONG TERM GOALS: Target date: 06/20/2023  Pt will be independent in  a HEP to improve shoulder ROM and strength Baseline: Goal status: MET 06/06/2023 2.   Pt will demonstrate return to full or near full ROM without increased pain Baseline:  Goal status: INITIAL  3.  Quick dash will improve to no greater than 12% to demonstrate improved function Baseline:  Goal status: INITIAL  4.  Pt will be able to pull off a tight shirt without difficulty Baseline:  Goal status: INITIAL  5.  Pt will be independent in more advanced HEP to include strengthening  Goal status: Initial   PLAN:  PT FREQUENCY: 1-2x/week  PT DURATION: 4 weeks  PLANNED INTERVENTIONS: 97164- PT Re-evaluation, 97110-Therapeutic exercises, 97530- Therapeutic activity, 97112- Neuromuscular re-education, 97535- Self Care, and 53664- Manual therapy  PLAN FOR NEXT SESSION: Picture of IR stretch, Quick dash, check goals,review theraband next, Jobes flex and scaption standing,foam roll pec wall stretch, progress to strength when ready, scar massage on right  Alvira Monday, PT 06/13/23 9:58 AM

## 2023-06-14 ENCOUNTER — Telehealth: Payer: Self-pay | Admitting: *Deleted

## 2023-06-14 NOTE — Telephone Encounter (Signed)
 Spoke with [name] who called the office reporting some uterine cramping that started this past weekend. Pt was seen on 3/26 with Dr. Tamela Oddi and started on estradiol patch and progesterone capsule on 3/27. Pt denies vaginal bleeding, discharge, pain, fever and or chills. Pt reports no other symptoms and wants to know if the cramping is normal? Advised patient that her message would be relayed to provider and the office would call back.   Called [name] back. Message relayed from Warner Mccreedy, NP that pt's uterine cramping can be from starting on the HRT. Pt is not experiencing any other symptoms and is concerned about vaginal bleeding. Pt advised that spotting can occur, but if any bleeding more than spotting occurs to call the office. Pt verbalized understanding and thanked the office. Pt states she  is going to follow up with a new provider at Dr. Jorene Minors office after her phone visit with Dr. Tamela Oddi in June.

## 2023-06-16 ENCOUNTER — Ambulatory Visit: Attending: General Surgery

## 2023-06-16 DIAGNOSIS — Z9889 Other specified postprocedural states: Secondary | ICD-10-CM | POA: Diagnosis not present

## 2023-06-16 DIAGNOSIS — Z1509 Genetic susceptibility to other malignant neoplasm: Secondary | ICD-10-CM | POA: Diagnosis not present

## 2023-06-16 DIAGNOSIS — M25612 Stiffness of left shoulder, not elsewhere classified: Secondary | ICD-10-CM | POA: Insufficient documentation

## 2023-06-16 DIAGNOSIS — M25611 Stiffness of right shoulder, not elsewhere classified: Secondary | ICD-10-CM | POA: Diagnosis not present

## 2023-06-16 DIAGNOSIS — Z1501 Genetic susceptibility to malignant neoplasm of breast: Secondary | ICD-10-CM | POA: Diagnosis not present

## 2023-06-16 NOTE — Therapy (Signed)
 OUTPATIENT PHYSICAL THERAPY  UPPER EXTREMITY ONCOLOGY TREATMENT  Patient Name: [name] MRN: [id number] DOB:22-Jan-1976, 48 y.o., female Today's Date: 06/16/2023  END OF SESSION:  PT End of Session - 06/16/23 0759     Visit Number 7    Number of Visits 8    Date for PT Re-Evaluation 06/20/23    Authorization Type BCBS    Authorization Time Period no Auth necessary    PT Start Time 0800    PT Stop Time 0850    PT Time Calculation (min) 50 min    Activity Tolerance Patient tolerated treatment well    Behavior During Therapy [facility] for tasks assessed/performed              Past Medical History:  Diagnosis Date   Anxiety 2011   BRCA1 gene mutation positive    dx 01/ 2020   Encounter for weight management 07/12/2022   Family history of breast cancer    Gestational diabetes mellitus, class A2 06/17/2014   Gestational thrombocytopenia (HCC) 06/17/2014   Hemorrhoids    History of gestational diabetes    Hypothyroidism    followed by dr Billy Coast   Mixed hyperlipidemia 04/02/2022   Past Surgical History:  Procedure Laterality Date   BREAST RECONSTRUCTION WITH PLACEMENT OF TISSUE EXPANDER AND FLEX HD (ACELLULAR HYDRATED DERMIS) Bilateral 04/06/2023   Procedure: BILATERAL BREAST RECONSTRUCTION WITH PLACEMENT OF TISSUE EXPANDER AND FLEX HD (ACELLULAR HYDRATED DERMIS);  Surgeon: [name], MD;  Location: [facility];  Service: Plastics;  Laterality: Bilateral;   BREAST RECONSTRUCTION WITH PLACEMENT OF TISSUE EXPANDER AND FLEX HD (ACELLULAR HYDRATED DERMIS) Bilateral 04/22/2023   Procedure: REVISION OF BREAST MASTECTOMY WITH DEBRIDMENT AND REMOVAL OF RIGHT TISSUE EXPANDER AND FLEX HD (ACELLULAR HYDRATED DERMIS);  Surgeon: [name], MD;  Location: [facility];  Service: Plastics;  Laterality: Bilateral;   CESAREAN SECTION  2007 & 2010   x 2 - at [facility]   CESAREAN SECTION WITH BILATERAL TUBAL LIGATION Bilateral 06/15/2014   Procedure: REPEAT  CESAREAN SECTION WITH BILATERAL TUBAL LIGATION;  Surgeon: Olivia Mackie, MD;  Location: [facility];  Service: Obstetrics;  Laterality: Bilateral;  EDD: 06/19/14   COLONOSCOPY WITH PROPOFOL  08/31/2012   NIPPLE SPARING MASTECTOMY Bilateral 04/06/2023   Procedure: BILATERAL NIPPLE SPARING MASTECTOMY;  Surgeon: [name], MD;  Location: [facility];  Service: General;  Laterality: Bilateral;  PEC BLOCK   ROBOTIC ASSISTED SALPINGO OOPHERECTOMY Right 10/30/2018   Procedure: XI ROBOTIC ASSISTED SALPINGO OOPHORECTOMY/With Lysis of Adhesions;  Surgeon: Olivia Mackie, MD;  Location: [facility];  Service: Gynecology;  Laterality: Right;  Requests 2 hrs.   TRIGGER FINGER RELEASE Left    TUBAL LIGATION  2016   wisdom teeth ext  teen   Patient Active Problem List   Diagnosis Date Noted   BRCA positive 04/06/2023   External hemorrhoids without complication 03/15/2023   Morbid obesity (HCC) 03/15/2023   History of mastopexy 01/06/2023   Encounter for weight management 07/12/2022   History of gestational diabetes 04/02/2022   High coronary artery calcium score 04/02/2022   Mixed hyperlipidemia 04/02/2022   History of panic attacks 04/02/2022   Pain in finger of left hand 07/30/2021   Encounter for orthopedic follow-up care 07/23/2021   Inflammatory pain 06/26/2021   Lichen sclerosus 05/07/2020   Menopausal symptom 05/07/2020   Osteoarthritis of left knee 02/22/2019   Morbid obesity with BMI of 40.0-44.9, adult (HCC) 01/04/2019   Breast cancer screening,  high risk patient 05/24/2018   BRCA1 gene mutation positive    Family history of breast cancer    Hypothyroidism 06/17/2014   Postpartum care following cesarean delivery (4/2) 06/15/2014   Born by cesarean section 06/15/2014   Cesarean delivery delivered 06/15/2014   Internal bleeding hemorrhoids 10/24/2012       REFERRING PROVIDER: [name], MD  REFERRING DIAG: s/p Bilateral Mastectomies for  BRCA  THERAPY DIAG:  Postoperative state  BRCA1 positive  Stiffness of right shoulder, not elsewhere classified  Stiffness of left shoulder, not elsewhere classified  ONSET DATE: 04/06/2023  Rationale for Evaluation and Treatment: Rehabilitation  SUBJECTIVE:                                                                                                                                                                                           SUBJECTIVE STATEMENT:   I already feel so much better. I have some tightness in my chest but  everything is more normal now. I can rach behind my back and reach to shelves now. I don't have the pain. My ROM is back to normal  EVAL  Incisions  on both sides are doing well now. My ROM is better, but there are certain things I can't do. Reaching overhead and back is hard.  Moving quickly can be uncomfortable, and gets sore afterwards. The expander gets uncomfortable in certain positions. The soonest she can have the expander on the right side is mid May but her oldest child graduates June 7, so a lot will depend on the date she is offered.  PERTINENT HISTORY:  Pt underwent prophylactic Bilateral  nipple-sparing mastectomies on April 06, 2023, with tissue expanders placed due to BRCA 1+  Shortly thereafter she  noticed discoloration of her nipples and developed a fever. Her condition worsened with open sores and necrotic tissue. She had  a second surgery on 04/22/2023 to remove both nipples and the right tissue expander. She has a tissue expander on the left side, which has healed well. She had breast lifts in 12/2022 and a tummy tuck as she lost 100 lbs recently. She had bilateral Salpingo-oophorectomies on 10/30/2018.   PAIN:  Are you having pain? None   PRECAUTIONS: BRCA1+, hypothyroidism  RED FLAGS: None   WEIGHT BEARING RESTRICTIONS: No  FALLS:  Has patient fallen in last 6 months? No  LIVING ENVIRONMENT: Lives with: lives with  their family, husband and 3 children 52, 34, and 8  Lives in: House/apartment    OCCUPATION: IT project mgr    LEISURE: walking, going to sporting events  HAND DOMINANCE: right   PRIOR LEVEL  OF FUNCTION: Independent  PATIENT GOALS: To have normal rROM   OBJECTIVE: Note: Objective measures were completed at Evaluation unless otherwise noted.  COGNITION: Overall cognitive status: Within functional limits for tasks assessed   PALPATION: No tenderness UT  OBSERVATIONS / OTHER ASSESSMENTS: incisions healing;prior breast lift, small scab present on left breast incision. Tiny scab on right. Lower excisions long and extending under the arms Left breast with expander present, right expander removed in second surgery when nipples were removed  SENSATION: Light touch: Deficits      POSTURE: forward head, rounded shoulders   UPPER EXTREMITY AROM/PROM:  A/PROM RIGHT   eval  RIGHT 06/13/2023  Shoulder extension 70 77  Shoulder flexion 135 159  Shoulder abduction 94 175  Shoulder internal rotation NT 55  Shoulder external rotation NT 110    (Blank rows = not tested)  A/PROM LEFT   eval LEFT 3/05/14/2023  Shoulder extension 70 72  Shoulder flexion 151 165  Shoulder abduction 110 172  Shoulder internal rotation 75 75  Shoulder external rotation 83 98    (Blank rows = not tested)  CERVICAL AROM: All within normal limits:     UPPER EXTREMITY STRENGTH:   LYMPHEDEMA ASSESSMENTS:   SURGERY TYPE/DATE: 04/06/2023 Bilateral nIpple Sparing mastectomies with immediate Reconstruction, 04/22/2023 Revision with removal of Right tissue expander and nipples  NUMBER OF LYMPH NODES REMOVED: 0  CHEMOTHERAPY: No  RADIATION:NO  HORMONE TREATMENT: NO  INFECTIONS: YES    FUNCTIONAL TESTS:    GAIT: WNL     QUICK DASH SURVEY: 36% EVAL, 06/16/2023 2%                                                                                                                             TREATMENT DATE:  06/16/2023 Forearms on wall with trunk rotation alternating sides x 5 Ball rolls on wall flex x 10,  abd x 5 ea Half foam roll:alternating shoulder flexion x 10, scaption x 10, horizontal abd x 10,snow angels x 10, horizontal abd red x 10 Sword bilaterally x 10 Scar massage to right medial and lateral incision avoiding T zone PROM bilateral shoulder flexion, scaption, abd, IR and ER  06/13/2023 Overhead pulleys x 2 min flexion and scaption. Half foam roll: Supine scaption with yellow x 10, supine horizontal abd x 5 yellow, 5 red, bilateral ER, sword x10 with red Snow angels on half foam roll x 10 Scar massage to right medial and lateral incision avoiding T zone PROM bilateral shoulder flexion, scaption, abd, IR and ER Measured bilateral AROM of shoulders Showed IR stretch behind back using opposite hand 06/06/2023 Pulleys x 2 min flexion and abduction Ball rolls on wall x 2 min flex and abd B x 5 Yellow TB standing: scapular retraction, shoulder extension, ER x 10 ea Jobes flexion and scaption 2# x10 B Corner chest stretch x 3 20-30 sec, single arm wall stretch x 1 Updated HEP with pics of postural exercises and  jobes, and chest stretches Scar massage to right medial and lateral incision avoiding T zone PROM bilateral shoulder flexion, scaption, abd, IR and ER  06/02/2023 Ball rolls on wall x 10 forward, 7 lateral B Half foam roll:AROM bilateral shoulder flexion, scaption, horizontal abduction, snow angels x6 Supine scapular series x 10 ea with yellow Pec wall stretch Updated HEP with supine scap series and gave yellow band PROM bilateral shoulder flexion, scaption, abduction, IR and ER Pt advised to actively mimic the motions of taking off a shirt for strength and ROM since that is still difficult 05/31/2023 Overhead pulleys flexion, scaption, abd x 2:30 min ea Ball rolls on wall x 10 flexion, x 5 abd bilaterally 3 D AROM bilateral flexion, scaption,  horizontal ,  snow angels x 5 Scar massage to right medial and lateral incision avoiding T zone PROM bilateral shoulder flexion, scaption, abd, IR and ER  05/26/2023 Overhead pulleys flexion, scaption, abd x 2:30 min ea Ball rolls on wall x 10 flexion, x 5 abd bilaterally STM bilateral UT, levator, pectorals with cocoa butter PROM bilateral shoulder flexion, scaption, abd, IR and ER  05/23/2023 Pt was educated in 4 post op exercises; Supine flexion, stargazer, sitting or standing scapular retraction and standing wall slides. She performed each x 5 reps holding 5 seconds. HEP was updated with pictures   PATIENT EDUCATION:  Education details: 4 post op exercises, LOS, POC, treatment interventions Person educated: Patient Education method: Explanation, Demonstration, and Handouts Education comprehension: verbalized understanding and returned demonstration  HOME EXERCISE PROGRAM: 4 post op exercises  ASSESSMENT:  CLINICAL IMPRESSION:  Pt has achieved all established goals. She has resumed most of her normal activities including strengthening, and function is greatly improved as evidenced by Quick dash of 2%. She feels ready to be released to an independent HEP.  OBJECTIVE IMPAIRMENTS: decreased activity tolerance, decreased knowledge of condition, decreased ROM, decreased strength, postural dysfunction, and pain.   ACTIVITY LIMITATIONS: lifting, sleeping, dressing, and reach over head  PARTICIPATION LIMITATIONS: cleaning and activities requiring reaching  PERSONAL FACTORS: 1 comorbidity: BRCA1+ s/p bilateral mastectomies with tissue expanders, and second surgery to remove right TE, nipples  are also affecting patient's functional outcome.   REHAB POTENTIAL: Excellent  CLINICAL DECISION MAKING: Stable/uncomplicated  EVALUATION COMPLEXITY: Low  GOALS: Goals reviewed with patient? Yes  /SHORT TERM GOALS= LONG TERM GOALS: Target date: 06/20/2023  Pt will be independent in  a HEP to improve  shoulder ROM and strength Baseline: Goal status: MET 06/06/2023 2.  Pt will demonstrate return to full or near full ROM without increased pain Baseline:  Goal status: MET 06/16/2023 3.  Quick dash will improve to no greater than 12% to demonstrate improved function Baseline:  Goal status: MET 06/16/2023 4.  Pt will be able to pull off a tight shirt without difficulty Baseline:  Goal status: MET 06/16/2023  5.  Pt will be independent in more advanced HEP to include strengthening  Goal status: MET 06/16/2023   PLAN:  PT FREQUENCY: 1-2x/week  PT DURATION: 4 weeks  PLANNED INTERVENTIONS: 97164- PT Re-evaluation, 97110-Therapeutic exercises, 97530- Therapeutic activity, 97112- Neuromuscular re-education, 97535- Self Care, and 13244- Manual therapy  PLAN FOR NEXT SESSION: Picture of IR stretch, Quick dash, check goals,review theraband next, Jobes flex and scaption standing,foam roll pec wall stretch, progress to strength when ready, scar massage on right PHYSICAL THERAPY DISCHARGE SUMMARY  Visits from Start of Care: 7  Current functional level related to goals / functional outcomes: Achieved all goals  Remaining deficits: None   Education / Equipment: HEP, theraband   Patient agrees to discharge. Patient goals were met. Patient is being discharged due to meeting the stated rehab goals.  Alvira Monday, PT 06/16/23 8:57 AM

## 2023-06-20 ENCOUNTER — Ambulatory Visit

## 2023-06-23 ENCOUNTER — Encounter

## 2023-06-27 ENCOUNTER — Telehealth: Payer: Self-pay | Admitting: *Deleted

## 2023-06-27 NOTE — Telephone Encounter (Signed)
 Spoke with Ms. Oehlert and relayed message from Dr. Gaylin Ke that bleeding-unscheduled-is common during the initial 3-6 months after starting HRT. Can offer a phone visit to discuss further is pt desires. Pt verbalized understanding. Pt also states she is going to call her Ob/Gyn office and get a follow up appt. And will call the office back if she decides to make a phone visit with Dr. Gaylin Ke. Pt thanked the office for calling back.

## 2023-06-27 NOTE — Telephone Encounter (Signed)
 Spoke with [name] who called the office this morning. Pt reports having vaginal bleeding that started on Saturday, and Sunday was heavy where she states, "I soaked through a pad in 3 hours"  The bleeding is red and moderate today with some uterine cramping. Pt denies pelvic pain, fever and or chills. Pt last saw Dr. Gaylin Ke on 3/26 and start hormone replacement shortly after that. Advised patient that her message would be relayed to provider.

## 2023-06-28 DIAGNOSIS — E8941 Symptomatic postprocedural ovarian failure: Secondary | ICD-10-CM | POA: Diagnosis not present

## 2023-06-28 DIAGNOSIS — Z1501 Genetic susceptibility to malignant neoplasm of breast: Secondary | ICD-10-CM | POA: Diagnosis not present

## 2023-06-30 ENCOUNTER — Encounter: Payer: Self-pay | Admitting: Physician Assistant

## 2023-07-05 ENCOUNTER — Other Ambulatory Visit: Payer: Self-pay

## 2023-07-05 ENCOUNTER — Telehealth: Payer: Self-pay | Admitting: Physician Assistant

## 2023-07-05 ENCOUNTER — Encounter: Payer: Self-pay | Admitting: Physician Assistant

## 2023-07-05 ENCOUNTER — Other Ambulatory Visit (INDEPENDENT_AMBULATORY_CARE_PROVIDER_SITE_OTHER)

## 2023-07-05 DIAGNOSIS — Z01818 Encounter for other preprocedural examination: Secondary | ICD-10-CM | POA: Diagnosis not present

## 2023-07-05 LAB — PROTIME-INR
INR: 1.2 ratio — ABNORMAL HIGH (ref 0.8–1.0)
Prothrombin Time: 12.3 s (ref 9.6–13.1)

## 2023-07-05 LAB — CBC WITH DIFFERENTIAL/PLATELET
Basophils Absolute: 0 10*3/uL (ref 0.0–0.1)
Basophils Relative: 0.4 % (ref 0.0–3.0)
Eosinophils Absolute: 0 10*3/uL (ref 0.0–0.7)
Eosinophils Relative: 0.3 % (ref 0.0–5.0)
HCT: 39.2 % (ref 36.0–46.0)
Hemoglobin: 13.1 g/dL (ref 12.0–15.0)
Lymphocytes Relative: 22.7 % (ref 12.0–46.0)
Lymphs Abs: 1.1 10*3/uL (ref 0.7–4.0)
MCHC: 33.5 g/dL (ref 30.0–36.0)
MCV: 93 fl (ref 78.0–100.0)
Monocytes Absolute: 0.3 10*3/uL (ref 0.1–1.0)
Monocytes Relative: 5.8 % (ref 3.0–12.0)
Neutro Abs: 3.3 10*3/uL (ref 1.4–7.7)
Neutrophils Relative %: 70.8 % (ref 43.0–77.0)
Platelets: 202 10*3/uL (ref 150.0–400.0)
RBC: 4.21 Mil/uL (ref 3.87–5.11)
RDW: 13.8 % (ref 11.5–15.5)
WBC: 4.7 10*3/uL (ref 4.0–10.5)

## 2023-07-05 LAB — COMPREHENSIVE METABOLIC PANEL WITH GFR
ALT: 21 U/L (ref 0–35)
AST: 29 U/L (ref 0–37)
Albumin: 4.3 g/dL (ref 3.5–5.2)
Alkaline Phosphatase: 64 U/L (ref 39–117)
BUN: 19 mg/dL (ref 6–23)
CO2: 26 meq/L (ref 19–32)
Calcium: 9.3 mg/dL (ref 8.4–10.5)
Chloride: 104 meq/L (ref 96–112)
Creatinine, Ser: 0.81 mg/dL (ref 0.40–1.20)
GFR: 87.64 mL/min (ref >60.00)
Glucose, Bld: 72 mg/dL (ref 70–99)
Potassium: 4.2 meq/L (ref 3.5–5.1)
Sodium: 136 meq/L (ref 135–145)
Total Bilirubin: 0.8 mg/dL (ref 0.2–1.2)
Total Protein: 6.9 g/dL (ref 6.0–8.3)

## 2023-07-05 LAB — HEMOGLOBIN A1C: Hgb A1c MFr Bld: 5.1 % (ref 4.6–6.5)

## 2023-07-05 LAB — APTT: aPTT: 31.3 s (ref 25.4–36.8)

## 2023-07-05 NOTE — Telephone Encounter (Signed)
 Future lab orders placed, and pt in lab getting completed

## 2023-07-05 NOTE — Telephone Encounter (Signed)
 Awaiting on approval from PCP to place future orders

## 2023-07-05 NOTE — Telephone Encounter (Signed)
 Pt was scheduled for lab today by E2C2. This is what was requested. Please advise.   CBC w/Diff & PLT *CPT 85025 Comprehensive Metabolic Panel *CPT 80053 Hemoglobin A1c *CPT 83036 Prothrombin Time with INR

## 2023-07-05 NOTE — Telephone Encounter (Signed)
 Please see patient message and request and advise on future labs

## 2023-07-13 ENCOUNTER — Encounter (HOSPITAL_BASED_OUTPATIENT_CLINIC_OR_DEPARTMENT_OTHER): Payer: Self-pay

## 2023-07-18 DIAGNOSIS — Z30430 Encounter for insertion of intrauterine contraceptive device: Secondary | ICD-10-CM | POA: Diagnosis not present

## 2023-07-19 NOTE — Anesthesia Preprocedure Evaluation (Signed)
 Anesthesia Evaluation  Patient identified by MRN, date of birth, ID band Patient awake    Reviewed: Allergy & Precautions, NPO status , Patient's Chart, lab work & pertinent test results  History of Anesthesia Complications Negative for: history of anesthetic complications  Airway Mallampati: I  TM Distance: >3 FB Neck ROM: Full   Comment: Previous grade I view with MAC 4, easy mask Dental  (+) Dental Advisory Given   Pulmonary neg pulmonary ROS   Pulmonary exam normal breath sounds clear to auscultation       Cardiovascular (-) angina + CAD  (-) Past MI, (-) Cardiac Stents and (-) CABG (-) dysrhythmias  Rhythm:Regular Rate:Normal  HLD   Neuro/Psych  PSYCHIATRIC DISORDERS Anxiety     negative neurological ROS     GI/Hepatic negative GI ROS, Neg liver ROS,,,  Endo/Other  neg diabetesHypothyroidism    Renal/GU negative Renal ROS     Musculoskeletal  (+) Arthritis ,    Abdominal   Peds  Hematology negative hematology ROS (+) Lab Results      Component                Value               Date                      WBC                      4.7                 07/05/2023                HGB                      13.1                07/05/2023                HCT                      39.2                07/05/2023                MCV                      93.0                07/05/2023                PLT                      202.0               07/05/2023              Anesthesia Other Findings Last Zepbound : 07/02/2023  Reproductive/Obstetrics H/o breast cancer                              Anesthesia Physical Anesthesia Plan  ASA: 2  Anesthesia Plan: General   Post-op Pain Management: Tylenol  PO (pre-op)*   Induction: Intravenous  PONV Risk Score and Plan: 3 and Ondansetron , Dexamethasone , Scopolamine  patch - Pre-op, Midazolam  and Treatment may vary due to age or medical condition  Airway  Management Planned: LMA  Additional  Equipment:   Intra-op Plan:   Post-operative Plan: Extubation in OR  Informed Consent: I have reviewed the patients History and Physical, chart, labs and discussed the procedure including the risks, benefits and alternatives for the proposed anesthesia with the patient or authorized representative who has indicated his/her understanding and acceptance.     Dental advisory given  Plan Discussed with: CRNA and Anesthesiologist  Anesthesia Plan Comments: (Risks of general anesthesia discussed including, but not limited to, sore throat, hoarse voice, chipped/damaged teeth, injury to vocal cords, nausea and vomiting, allergic reactions, lung infection, heart attack, stroke, and death. All questions answered. )         Anesthesia Quick Evaluation

## 2023-07-21 ENCOUNTER — Ambulatory Visit (HOSPITAL_BASED_OUTPATIENT_CLINIC_OR_DEPARTMENT_OTHER): Payer: Self-pay | Admitting: Anesthesiology

## 2023-07-21 ENCOUNTER — Other Ambulatory Visit: Payer: Self-pay

## 2023-07-21 ENCOUNTER — Encounter (HOSPITAL_BASED_OUTPATIENT_CLINIC_OR_DEPARTMENT_OTHER)
Admission: RE | Disposition: A | Discharge status: Home / Self Care | Payer: Self-pay | Source: Home / Self Care | Attending: Plastic Surgery

## 2023-07-21 ENCOUNTER — Ambulatory Visit (HOSPITAL_BASED_OUTPATIENT_CLINIC_OR_DEPARTMENT_OTHER)
Admission: RE | Admit: 2023-07-21 | Discharge: 2023-07-21 | Disposition: A | Discharge status: Home / Self Care | Attending: Plastic Surgery | Admitting: Plastic Surgery

## 2023-07-21 ENCOUNTER — Encounter (HOSPITAL_BASED_OUTPATIENT_CLINIC_OR_DEPARTMENT_OTHER): Payer: Self-pay

## 2023-07-21 DIAGNOSIS — Z853 Personal history of malignant neoplasm of breast: Secondary | ICD-10-CM | POA: Diagnosis not present

## 2023-07-21 DIAGNOSIS — Z9011 Acquired absence of right breast and nipple: Secondary | ICD-10-CM | POA: Insufficient documentation

## 2023-07-21 DIAGNOSIS — Z421 Encounter for breast reconstruction following mastectomy: Secondary | ICD-10-CM | POA: Diagnosis not present

## 2023-07-21 DIAGNOSIS — Z01818 Encounter for other preprocedural examination: Secondary | ICD-10-CM

## 2023-07-21 DIAGNOSIS — I2510 Atherosclerotic heart disease of native coronary artery without angina pectoris: Secondary | ICD-10-CM | POA: Diagnosis not present

## 2023-07-21 DIAGNOSIS — Z79899 Other long term (current) drug therapy: Secondary | ICD-10-CM | POA: Diagnosis not present

## 2023-07-21 DIAGNOSIS — L905 Scar conditions and fibrosis of skin: Secondary | ICD-10-CM | POA: Diagnosis not present

## 2023-07-21 HISTORY — PX: TISSUE EXPANDER PLACEMENT: SHX2530

## 2023-07-21 SURGERY — INSERTION, TISSUE EXPANDER
Anesthesia: General | Site: Breast | Laterality: Right

## 2023-07-21 MED ORDER — DEXAMETHASONE SODIUM PHOSPHATE 10 MG/ML IJ SOLN
INTRAMUSCULAR | Status: AC
  Filled 2023-07-21: qty 1

## 2023-07-21 MED ORDER — SODIUM CHLORIDE 0.9 % IV SOLN
INTRAVENOUS | Status: AC
  Filled 2023-07-21: qty 10

## 2023-07-21 MED ORDER — AMISULPRIDE (ANTIEMETIC) 5 MG/2ML IV SOLN: 10.0000 mg | Freq: Once | INTRAVENOUS | Status: DC | PRN

## 2023-07-21 MED ORDER — 0.9 % SODIUM CHLORIDE (POUR BTL) OPTIME
TOPICAL | Status: DC | PRN
  Administered 2023-07-21: 1000 mL

## 2023-07-21 MED ORDER — EPHEDRINE SULFATE (PRESSORS) 50 MG/ML IJ SOLN
INTRAMUSCULAR | Status: DC | PRN
Start: 2023-07-21 — End: 2023-07-21
  Administered 2023-07-21 (×3): 10 mg via INTRAVENOUS
  Administered 2023-07-21 (×2): 5 mg via INTRAVENOUS

## 2023-07-21 MED ORDER — PROPOFOL 10 MG/ML IV BOLUS
INTRAVENOUS | Status: DC | PRN
  Administered 2023-07-21: 150 mg via INTRAVENOUS

## 2023-07-21 MED ORDER — FENTANYL CITRATE (PF) 100 MCG/2ML IJ SOLN: 25.0000 ug | INTRAMUSCULAR | Status: DC | PRN

## 2023-07-21 MED ORDER — CHLORHEXIDINE GLUCONATE CLOTH 2 % EX PADS: 6.0000 | MEDICATED_PAD | Freq: Once | CUTANEOUS | Status: DC

## 2023-07-21 MED ORDER — CEFAZOLIN SODIUM-DEXTROSE 2-4 GM/100ML-% IV SOLN
INTRAVENOUS | Status: AC
  Filled 2023-07-21: qty 100

## 2023-07-21 MED ORDER — BUPIVACAINE-EPINEPHRINE 0.25% -1:200000 IJ SOLN
INTRAMUSCULAR | Status: DC | PRN
  Administered 2023-07-21: 20 mL

## 2023-07-21 MED ORDER — SCOPOLAMINE 1 MG/3DAYS TD PT72
MEDICATED_PATCH | TRANSDERMAL | Status: AC
  Filled 2023-07-21: qty 1

## 2023-07-21 MED ORDER — FENTANYL CITRATE (PF) 100 MCG/2ML IJ SOLN
INTRAMUSCULAR | Status: DC | PRN
  Administered 2023-07-21: 100 ug via INTRAVENOUS
  Administered 2023-07-21 (×2): 50 ug via INTRAVENOUS

## 2023-07-21 MED ORDER — FENTANYL CITRATE (PF) 100 MCG/2ML IJ SOLN
INTRAMUSCULAR | Status: AC
  Filled 2023-07-21: qty 2

## 2023-07-21 MED ORDER — ACETAMINOPHEN 500 MG PO TABS
1000.0000 mg | ORAL_TABLET | Freq: Once | ORAL | Status: AC
Start: 2023-07-21 — End: 2023-07-21
  Administered 2023-07-21: 1000 mg via ORAL

## 2023-07-21 MED ORDER — MIDAZOLAM HCL 5 MG/5ML IJ SOLN
INTRAMUSCULAR | Status: DC | PRN
  Administered 2023-07-21: 2 mg via INTRAVENOUS

## 2023-07-21 MED ORDER — CHLORHEXIDINE GLUCONATE CLOTH 2 % EX PADS
6.0000 | MEDICATED_PAD | Freq: Once | CUTANEOUS | Status: AC
  Administered 2023-07-21: 6 via TOPICAL

## 2023-07-21 MED ORDER — ONDANSETRON HCL 4 MG/2ML IJ SOLN
INTRAMUSCULAR | Status: DC | PRN
  Administered 2023-07-21: 4 mg via INTRAVENOUS

## 2023-07-21 MED ORDER — ONDANSETRON HCL 4 MG/2ML IJ SOLN
INTRAMUSCULAR | Status: AC
  Filled 2023-07-21: qty 4

## 2023-07-21 MED ORDER — CEFAZOLIN SODIUM-DEXTROSE 2-4 GM/100ML-% IV SOLN
2.0000 g | INTRAVENOUS | Status: AC
Start: 2023-07-21 — End: 2023-07-21
  Administered 2023-07-21: 2 g via INTRAVENOUS

## 2023-07-21 MED ORDER — OXYCODONE HCL 5 MG/5ML PO SOLN: 5.0000 mg | Freq: Once | ORAL | Status: AC | PRN

## 2023-07-21 MED ORDER — OXYCODONE HCL 5 MG PO TABS
5.0000 mg | ORAL_TABLET | Freq: Once | ORAL | Status: AC | PRN
  Administered 2023-07-21: 5 mg via ORAL

## 2023-07-21 MED ORDER — OXYCODONE HCL 5 MG PO TABS
ORAL_TABLET | ORAL | Status: AC
  Filled 2023-07-21: qty 1

## 2023-07-21 MED ORDER — LIDOCAINE 2% (20 MG/ML) 5 ML SYRINGE
INTRAMUSCULAR | Status: AC
  Filled 2023-07-21: qty 10

## 2023-07-21 MED ORDER — KETOROLAC TROMETHAMINE 30 MG/ML IJ SOLN
INTRAMUSCULAR | Status: AC
  Filled 2023-07-21: qty 1

## 2023-07-21 MED ORDER — SODIUM CHLORIDE 0.9 % IV SOLN: INTRAVENOUS | Status: DC | PRN

## 2023-07-21 MED ORDER — LIDOCAINE HCL (CARDIAC) PF 100 MG/5ML IV SOSY
PREFILLED_SYRINGE | INTRAVENOUS | Status: DC | PRN
  Administered 2023-07-21: 60 mg via INTRAVENOUS

## 2023-07-21 MED ORDER — ACETAMINOPHEN 500 MG PO TABS
ORAL_TABLET | ORAL | Status: AC
  Filled 2023-07-21: qty 2

## 2023-07-21 MED ORDER — LACTATED RINGERS IV SOLN: INTRAVENOUS | Status: DC

## 2023-07-21 MED ORDER — SCOPOLAMINE 1 MG/3DAYS TD PT72
1.0000 | MEDICATED_PATCH | TRANSDERMAL | Status: DC
  Administered 2023-07-21: 1.5 mg via TRANSDERMAL

## 2023-07-21 MED ORDER — DEXAMETHASONE SODIUM PHOSPHATE 4 MG/ML IJ SOLN
INTRAMUSCULAR | Status: DC | PRN
  Administered 2023-07-21: 5 mg via INTRAVENOUS

## 2023-07-21 MED ORDER — MIDAZOLAM HCL 2 MG/2ML IJ SOLN
INTRAMUSCULAR | Status: AC
  Filled 2023-07-21: qty 2

## 2023-07-21 SURGICAL SUPPLY — 55 items
BAG DECANTER FOR FLEXI CONT (MISCELLANEOUS) ×2 IMPLANT
BINDER BREAST LRG (GAUZE/BANDAGES/DRESSINGS) IMPLANT
BIOPATCH RED 1 DISK 7.0 (GAUZE/BANDAGES/DRESSINGS) IMPLANT
BLADE SURG 10 STRL SS (BLADE) IMPLANT
BLADE SURG 15 STRL LF DISP TIS (BLADE) ×2 IMPLANT
BNDG ELASTIC 6X10 VLCR STRL LF (GAUZE/BANDAGES/DRESSINGS) ×2 IMPLANT
CANISTER SUCT 1200ML W/VALVE (MISCELLANEOUS) ×2 IMPLANT
CHLORAPREP W/TINT 26 (MISCELLANEOUS) ×2 IMPLANT
COVER BACK TABLE 60X90IN (DRAPES) ×2 IMPLANT
COVER MAYO STAND STRL (DRAPES) ×2 IMPLANT
DRAIN CHANNEL 15F RND FF W/TCR (WOUND CARE) IMPLANT
DRAPE LAPAROSCOPIC ABDOMINAL (DRAPES) ×2 IMPLANT
DRAPE UTILITY XL STRL (DRAPES) ×2 IMPLANT
DRSG TEGADERM 4X4.75 (GAUZE/BANDAGES/DRESSINGS) IMPLANT
ELECT COATED BLADE 2.86 ST (ELECTRODE) IMPLANT
ELECTRODE BLDE 4.0 EZ CLN MEGD (MISCELLANEOUS) IMPLANT
ELECTRODE REM PT RTRN 9FT ADLT (ELECTROSURGICAL) ×2 IMPLANT
EVACUATOR SILICONE 100CC (DRAIN) IMPLANT
FUNNEL KELLER 2 DISP (MISCELLANEOUS) ×2 IMPLANT
GAUZE PAD ABD 8X10 STRL (GAUZE/BANDAGES/DRESSINGS) ×4 IMPLANT
GAUZE SPONGE 4X4 12PLY STRL (GAUZE/BANDAGES/DRESSINGS) ×2 IMPLANT
GLOVE BIOGEL M STRL SZ7.5 (GLOVE) ×2 IMPLANT
GLOVE BIOGEL PI IND STRL 8 (GLOVE) ×2 IMPLANT
GOWN STRL REUS W/ TWL LRG LVL3 (GOWN DISPOSABLE) ×2 IMPLANT
GOWN STRL REUS W/ TWL XL LVL3 (GOWN DISPOSABLE) ×4 IMPLANT
GRAFT FLEX HD 19X22X0.7-1.4 (Tissue) IMPLANT
IMPL EXPANDER BREAST 375CC (Breast) IMPLANT
IV NS 500ML BAXH (IV SOLUTION) IMPLANT
KIT FILL ASEPTIC TRANSFER (MISCELLANEOUS) IMPLANT
MARKER SKIN DUAL TIP RULER LAB (MISCELLANEOUS) IMPLANT
NDL HYPO 25X1 1.5 SAFETY (NEEDLE) ×2 IMPLANT
NEEDLE HYPO 25X1 1.5 SAFETY (NEEDLE) ×1 IMPLANT
PACK BASIN DAY SURGERY FS (CUSTOM PROCEDURE TRAY) ×2 IMPLANT
PENCIL SMOKE EVACUATOR (MISCELLANEOUS) ×2 IMPLANT
PIN SAFETY STERILE (MISCELLANEOUS) IMPLANT
RETRACTOR ONETRAX LX 90X20 (MISCELLANEOUS) IMPLANT
SLEEVE SCD COMPRESS KNEE MED (STOCKING) ×2 IMPLANT
SPIKE FLUID TRANSFER (MISCELLANEOUS) IMPLANT
SPONGE T-LAP 18X18 ~~LOC~~+RFID (SPONGE) ×2 IMPLANT
STAPLER INSORB 30 2030 C-SECTI (MISCELLANEOUS) IMPLANT
STAPLER SKIN PROX WIDE 3.9 (STAPLE) ×2 IMPLANT
STRIP SUTURE WOUND CLOSURE 1/2 (MISCELLANEOUS) ×4 IMPLANT
SUT ETHILON 2 0 FS 18 (SUTURE) IMPLANT
SUT ETHILON 3 0 PS 1 (SUTURE) IMPLANT
SUT PDS 3-0 CT2 (SUTURE) ×4 IMPLANT
SUT PDS II 3-0 CT2 27 ABS (SUTURE) ×4 IMPLANT
SUT STRATA 3-0 60 PS-1 (SUTURE) ×2 IMPLANT
SUTURE STRATFX 0 PDS 27 VIOLET (SUTURE) IMPLANT
SYR 50ML LL SCALE MARK (SYRINGE) IMPLANT
SYR BULB IRRIG 60ML STRL (SYRINGE) ×2 IMPLANT
SYR CONTROL 10ML LL (SYRINGE) ×2 IMPLANT
TOWEL GREEN STERILE FF (TOWEL DISPOSABLE) ×2 IMPLANT
TUBE CONNECTING 20X1/4 (TUBING) ×2 IMPLANT
UNDERPAD 30X36 HEAVY ABSORB (UNDERPADS AND DIAPERS) ×4 IMPLANT
YANKAUER SUCT BULB TIP NO VENT (SUCTIONS) ×2 IMPLANT

## 2023-07-21 NOTE — Transfer of Care (Signed)
 Immediate Anesthesia Transfer of Care Note  Patient: [name]  Procedure(s) Performed: INSERTION, TISSUE EXPANDER (Right: Breast)  Patient Location: PACU  Anesthesia Type:General  Level of Consciousness: awake and patient cooperative  Airway & Oxygen Therapy: Patient Spontanous Breathing and Patient connected to face mask oxygen  Post-op Assessment: Report given to RN and Post -op Vital signs reviewed and stable  Post vital signs: Reviewed and stable  Last Vitals:  Vitals Value Taken Time  BP 138/71 07/21/23 1458  Temp    Pulse 68 07/21/23 1459  Resp 11 07/21/23 1459  SpO2 100 % 07/21/23 1459  Vitals shown include unfiled device data.  Last Pain:  Vitals:   07/21/23 1126  TempSrc:   PainSc: 0-No pain      Patients Stated Pain Goal: 3 (07/21/23 1120)  Complications: No notable events documented.

## 2023-07-21 NOTE — Discharge Instructions (Signed)
 You may have Tylenol  after 5:30pm   Post Anesthesia Home Care Instructions  Activity: Get plenty of rest for the remainder of the day. A responsible individual must stay with you for 24 hours following the procedure.  For the next 24 hours, DO NOT: -Drive a car -Advertising copywriter -Drink alcoholic beverages -Take any medication unless instructed by your physician -Make any legal decisions or sign important papers.  Meals: Start with liquid foods such as gelatin or soup. Progress to regular foods as tolerated. Avoid greasy, spicy, heavy foods. If nausea and/or vomiting occur, drink only clear liquids until the nausea and/or vomiting subsides. Call your physician if vomiting continues.  Special Instructions/Symptoms: Your throat may feel dry or sore from the anesthesia or the breathing tube placed in your throat during surgery. If this causes discomfort, gargle with warm salt water. The discomfort should disappear within 24 hours.  If you had a scopolamine  patch placed behind your ear for the management of post- operative nausea and/or vomiting:  1. The medication in the patch is effective for 72 hours, after which it should be removed.  Wrap patch in a tissue and discard in the trash. Wash hands thoroughly with soap and water. 2. You may remove the patch earlier than 72 hours if you experience unpleasant side effects which may include dry mouth, dizziness or visual disturbances. 3. Avoid touching the patch. Wash your hands with soap and water after contact with the patch.   About my Jackson-Pratt Bulb Drain  What is a Jackson-Pratt bulb? A Jackson-Pratt is a soft, round device used to collect drainage. It is connected to a long, thin drainage catheter, which is held in place by one or two small stiches near your surgical incision site. When the bulb is squeezed, it forms a vacuum, forcing the drainage to empty into the bulb.  Emptying the Jackson-Pratt bulb- To empty the bulb: 1.  Release the plug on the top of the bulb. 2. Pour the bulb's contents into a measuring container which your nurse will provide. 3. Record the time emptied and amount of drainage. Empty the drain(s) as often as your     doctor or nurse recommends.  Date                  Time                    Amount (Drain 1)                 Amount (Drain 2)  _____________________________________________________________________  _____________________________________________________________________  _____________________________________________________________________  _____________________________________________________________________  _____________________________________________________________________  _____________________________________________________________________  _____________________________________________________________________  _____________________________________________________________________  Squeezing the Jackson-Pratt Bulb- To squeeze the bulb: 1. Make sure the plug at the top of the bulb is open. 2. Squeeze the bulb tightly in your fist. You will hear air squeezing from the bulb. 3. Replace the plug while the bulb is squeezed. 4. Use a safety pin to attach the bulb to your clothing. This will keep the catheter from     pulling at the bulb insertion site.  When to call your doctor- Call your doctor if: Drain site becomes red, swollen or hot. You have a fever greater than 101 degrees F. There is oozing at the drain site. Drain falls out (apply a guaze bandage over the drain hole and secure it with tape). Drainage increases daily not related to activity patterns. (You will usually have more drainage when you are active than when you are resting.) Drainage has  a bad odor.

## 2023-07-21 NOTE — Anesthesia Postprocedure Evaluation (Signed)
 Anesthesia Post Note  Patient: [name]  Procedure(s) Performed: INSERTION, TISSUE EXPANDER (Right: Breast)     Patient location during evaluation: PACU Anesthesia Type: General Level of consciousness: awake Pain management: pain level controlled Vital Signs Assessment: post-procedure vital signs reviewed and stable Respiratory status: spontaneous breathing, nonlabored ventilation and respiratory function stable Cardiovascular status: blood pressure returned to baseline and stable Postop Assessment: no apparent nausea or vomiting Anesthetic complications: no   No notable events documented.  Last Vitals:  Vitals:   07/21/23 1530 07/21/23 1545  BP: 132/71 128/73  Pulse: 71 65  Resp: 15 14  Temp:    SpO2: 100% 99%    Last Pain:  Vitals:   07/21/23 1545  TempSrc:   PainSc: 0-No pain                 Conard Decent

## 2023-07-21 NOTE — Op Note (Signed)
 Operative Note   DATE OF OPERATION: 07/21/2023  SURGICAL DEPARTMENT: Plastic Surgery  LOCATION: [facility]  PREOPERATIVE DIAGNOSES: Right mastectomy defect  POSTOPERATIVE DIAGNOSES:  same  PROCEDURE: Right breast reconstruction with tissue expander and acellular dermal matrix  SURGEON: Francee Inch, MD  ASSISTANT: None  ANESTHESIA:  General.   COMPLICATIONS: None.   INDICATIONS FOR PROCEDURE:  The patient, [name] is a 48 y.o. female born on [birth year], is here for treatment of right mastectomy defect MRN: [id number]  CONSENT:  Informed consent was obtained directly from the patient. Risks, benefits and alternatives were fully discussed. Specific risks including but not limited to bleeding, infection, hematoma, seroma, scarring, pain, contracture, asymmetry, wound healing problems, and need for further surgery were all discussed. The patient did have an ample opportunity to have questions answered to satisfaction.   DESCRIPTION OF PROCEDURE:  The patient was taken to the operating room. SCDs were placed and  antibiotics were given.  General anesthesia was administered.  The patient's operative site was prepped and draped in a sterile fashion. A time out was performed and all information was confirmed to be correct.  Started by marking out the incision.  She had a small remnant of areola along the vertical limb which I plan to excise.  I marked out symmetric points in terms of the placement of a contralateral expander.  Marcaine  with epinephrine was injected diffusely turned 20 cc.  I then made the incision excising her scar and previous areola and that was sent to pathology.  Subcutaneous elevation was then done to the extent of the pocket.  This was done with a combination of cautery and scissors.  Cautery was used to ensure hemostasis.  15 French drain was placed and secured with a nylon suture.  The acellular dermal matrix was then brought onto the field.   This was a piece of pliable pre-Flex HD.  It was soaked according to manufacturer's instructions.  3-0 PDS was run around the periphery for pursestring.  The expander was then brought onto the field.  This was a Press photographer plus smooth 375 cc expander with a serial number of [id number].  All the air was removed from the expander and it was placed within the matrix and the pursestring was tied down.  Suture tabs were brought out at 3:00 and 6:00.  Corresponding 3-0 PDS sutures were placed in the muscle at the 3 and 6:00 positions and then run through the suture tabs.  The pocket was irrigated with antibiotic solution.  The expander was placed and then the PDS stay sutures tied down to secured in position and prevent rotation.  No saline was infused due to a somewhat tight closure.  The skin was then advanced and closed in layers.  3-0 PDS was placed in the deeper subcutaneous tissue.  A few millimeters of dermal release was done along the scar to prevent invagination of the skin.  The next layer was done with a combination of INSORB staples and 3-0 PDS followed by 3 oh strata fix for the final closure layer.  Steri-Strips and a soft dressing were applied.  The patient tolerated the procedure well.  There were no complications. The patient was allowed to wake from anesthesia, extubated and taken to the recovery room in satisfactory condition.

## 2023-07-21 NOTE — Interval H&P Note (Signed)
 History and Physical Interval Note:  07/21/2023 12:35 PM  [name]  has presented today for surgery, with the diagnosis of DELAYED INSERTION OF TISSUE EXPANDER  HX OF BREAST CANCER.  The various methods of treatment have been discussed with the patient and family. After consideration of risks, benefits and other options for treatment, the patient has consented to  Procedure(s) with comments: INSERTION, TISSUE EXPANDER (Right) - WITH ACELLULAR DERMAL MATRIX as a surgical intervention.  The patient's history has been reviewed, patient examined, no change in status, stable for surgery.  I have reviewed the patient's chart and labs.  Questions were answered to the patient's satisfaction.     [name]

## 2023-07-21 NOTE — Anesthesia Procedure Notes (Signed)
 Procedure Name: LMA Insertion Date/Time: 07/21/2023 1:14 PM  Performed by: Eugenia Hess, CRNAPre-anesthesia Checklist: Patient identified, Emergency Drugs available, Suction available and Patient being monitored Patient Re-evaluated:Patient Re-evaluated prior to induction Oxygen Delivery Method: Circle System Utilized Preoxygenation: Pre-oxygenation with 100% oxygen Induction Type: IV induction Ventilation: Mask ventilation without difficulty LMA: LMA inserted LMA Size: 4.0 Number of attempts: 1 Airway Equipment and Method: bite block Placement Confirmation: positive ETCO2 Tube secured with: Tape Dental Injury: Teeth and Oropharynx as per pre-operative assessment

## 2023-07-21 NOTE — H&P (Signed)
 Reason for Consult/CC: Right breast reconstruction  [name] is an 48 y.o. female.  HPI: Patient presents for right breast reconstruction with tissue expander and acellular dermal matrix  Allergies: No Known Allergies  Medications:  Current Facility-Administered Medications:    ceFAZolin  (ANCEF ) IVPB 2g/100 mL premix, 2 g, Intravenous, On Call to OR, [name], MD   6 CHG cloth bath night before surgery, , , Once **AND** [START ON 07/22/2023] 6 CHG cloth bath AM of surgery, , , Once **AND** Chlorhexidine  Gluconate Cloth 2 % PADS 6 each, 6 each, Topical, Once **AND** [COMPLETED] Chlorhexidine  Gluconate Cloth 2 % PADS 6 each, 6 each, Topical, Once, [name], MD, 6 each at 07/21/23 1126   lactated ringers  infusion, , Intravenous, Continuous, Earvin Goldberg, MD, Last Rate: 10 mL/hr at 07/21/23 1203, Continued from Pre-op at 07/21/23 1203   scopolamine  (TRANSDERM-SCOP) 1 MG/3DAYS 1.5 mg, 1 patch, Transdermal, Q72H, Conard Decent, MD, 1.5 mg at 07/21/23 1126  Past Medical History:  Diagnosis Date   Anxiety 2011   BRCA1 gene mutation positive    dx 01/ 2020   Encounter for weight management 07/12/2022   Family history of breast cancer    Gestational thrombocytopenia (HCC) 06/17/2014   Hemorrhoids    History of gestational diabetes    Hypothyroidism    followed by dr Harless Lien   Mixed hyperlipidemia 04/02/2022    Past Surgical History:  Procedure Laterality Date   BREAST RECONSTRUCTION WITH PLACEMENT OF TISSUE EXPANDER AND FLEX HD (ACELLULAR HYDRATED DERMIS) Bilateral 04/06/2023   Procedure: BILATERAL BREAST RECONSTRUCTION WITH PLACEMENT OF TISSUE EXPANDER AND FLEX HD (ACELLULAR HYDRATED DERMIS);  Surgeon: [name], MD;  Location: [facility];  Service: Plastics;  Laterality: Bilateral;   BREAST RECONSTRUCTION WITH PLACEMENT OF TISSUE EXPANDER AND FLEX HD (ACELLULAR HYDRATED DERMIS) Bilateral 04/22/2023   Procedure: REVISION OF BREAST  MASTECTOMY WITH DEBRIDMENT AND REMOVAL OF RIGHT TISSUE EXPANDER AND FLEX HD (ACELLULAR HYDRATED DERMIS);  Surgeon: [name], MD;  Location: [facility];  Service: Plastics;  Laterality: Bilateral;   CESAREAN SECTION  2007 & 2010   x 2 - at [facility]   CESAREAN SECTION WITH BILATERAL TUBAL LIGATION Bilateral 06/15/2014   Procedure: REPEAT CESAREAN SECTION WITH BILATERAL TUBAL LIGATION;  Surgeon: Meriam Stamp, MD;  Location: [facility];  Service: Obstetrics;  Laterality: Bilateral;  EDD: 06/19/14   COLONOSCOPY WITH PROPOFOL   08/31/2012   NIPPLE SPARING MASTECTOMY Bilateral 04/06/2023   Procedure: BILATERAL NIPPLE SPARING MASTECTOMY;  Surgeon: Enid Harry, MD;  Location: [facility];  Service: General;  Laterality: Bilateral;  PEC BLOCK   ROBOTIC ASSISTED SALPINGO OOPHERECTOMY Right 10/30/2018   Procedure: XI ROBOTIC ASSISTED SALPINGO OOPHORECTOMY/With Lysis of Adhesions;  Surgeon: Meriam Stamp, MD;  Location: [facility];  Service: Gynecology;  Laterality: Right;  Requests 2 hrs.   TRIGGER FINGER RELEASE Left    TUBAL LIGATION  2016   wisdom teeth ext  teen    Family History  Problem Relation Age of Onset   Diabetes Mother    Breast cancer Mother        dx 20 and 61, BRCA1+   Colon polyps Mother    Fibromyalgia Mother    Cancer Mother    Obesity Mother    Varicose Veins Mother    Kidney disease Father    Heart disease Father    Hypertension Father    Alcohol abuse Father    Obesity Father  Stroke Father    Diabetes Sister    Obesity Sister    Cancer Maternal Grandmother        breast dx 11s, d. 57s   Breast cancer Paternal Aunt        dx 77s   Leukemia Cousin        dx childhood    Social History:  reports that she has never smoked. She has never used smokeless tobacco. She reports that she does not currently use alcohol. She reports that she does not use drugs.  Physical Exam Blood pressure 115/69, pulse (!) 57, temperature  97.9 F (36.6 C), temperature source Temporal, resp. rate 20, height 5\' 2"  (1.575 m), weight 58.7 kg, SpO2 100%. General: Well-developed well-nourished no acute distress.  Right mastectomy defect  No results found for this or any previous visit (from the past 48 hours).  No results found.  Assessment/Plan: Patient's good candidate for right breast reconstruction.  We discussed risks and benefits.  All of her questions were answered she is interested in moving forward.  [name] 07/21/2023, 12:34 PM

## 2023-07-22 ENCOUNTER — Encounter (HOSPITAL_BASED_OUTPATIENT_CLINIC_OR_DEPARTMENT_OTHER): Payer: Self-pay | Admitting: Plastic Surgery

## 2023-07-22 LAB — SURGICAL PATHOLOGY

## 2023-08-15 DIAGNOSIS — Z30431 Encounter for routine checking of intrauterine contraceptive device: Secondary | ICD-10-CM | POA: Diagnosis not present

## 2023-08-24 ENCOUNTER — Inpatient Hospital Stay: Attending: Hematology and Oncology | Admitting: Obstetrics & Gynecology

## 2023-08-24 DIAGNOSIS — T385X5A Adverse effect of other estrogens and progestogens, initial encounter: Secondary | ICD-10-CM

## 2023-08-24 DIAGNOSIS — N939 Abnormal uterine and vaginal bleeding, unspecified: Secondary | ICD-10-CM

## 2023-08-24 NOTE — Progress Notes (Signed)
 Virtual Visit via Telephone Note  I connected with [name] on 08/24/23 at  4:15 PM EDT by telephone and verified that I am speaking with the correct person using two identifiers.  Location: Patient: Home Provider: Clinic   I discussed the limitations, risks, security and privacy concerns of performing an evaluation and management service by telephone and the availability of in person appointments. I also discussed with the patient that there may be a patient responsible charge related to this service. The patient expressed understanding and agreed to proceed.   History of Present Illness: Discussed the use of AI scribe software for clinical note transcription with the patient, who gave verbal consent to proceed.  History of Present Illness [name] is a 48 year old female who presents with bleeding after starting HRT. She has been experiencing significant uterine bleeding, described as more than spotting, occurring almost daily. This bleeding began approximately a month and a half ago.  A Mirena IUD was placed on Jul 18, 2023, to address the bleeding and simplify the HRT regimen by Dr. Conda Deems. Despite the IUD placement, she continues to experience significant bleeding. She reports that a good response to the HRT.   She inquires about the expected duration of the bleeding and wants to continue the hormone patch due to the positive effects on her well-being. She mentions that the patch sometimes does not adhere well, especially in certain areas or when exposed to water or sweat, and has been experimenting with different placement spots to improve adherence.       Assessment and Plan: Bleeding on HRT Reassured that uterine bleeding can occur in the early months of combined, continuous hormone therapy regimens, The LNG IUD reduces episodes of unscheduled bleeding  Follow Up Instructions: Can continue follow-up with Dr. Tammy Fallen group for further management of the HRT.   I  discussed the assessment and treatment plan with the patient. The patient was provided an opportunity to ask questions and all were answered. The patient agreed with the plan and demonstrated an understanding of the instructions.   The patient was advised to call back or seek an in-person evaluation if the symptoms worsen or if the condition fails to improve as anticipated.  I provided 25 minutes of non-face-to-face time during this encounter.   Abdul Hodgkin, MD     ,

## 2023-08-25 ENCOUNTER — Encounter: Payer: Self-pay | Admitting: Obstetrics & Gynecology

## 2023-09-30 ENCOUNTER — Encounter: Admitting: Physician Assistant

## 2023-10-07 ENCOUNTER — Ambulatory Visit: Payer: BC Managed Care – PPO | Admitting: Hematology and Oncology

## 2023-10-13 ENCOUNTER — Other Ambulatory Visit: Payer: Self-pay | Admitting: Physician Assistant

## 2023-10-31 ENCOUNTER — Other Ambulatory Visit: Payer: Self-pay | Admitting: Physician Assistant

## 2023-10-31 DIAGNOSIS — L709 Acne, unspecified: Secondary | ICD-10-CM

## 2023-11-09 ENCOUNTER — Ambulatory Visit (INDEPENDENT_AMBULATORY_CARE_PROVIDER_SITE_OTHER): Admitting: Physician Assistant

## 2023-11-09 ENCOUNTER — Encounter: Payer: Self-pay | Admitting: Physician Assistant

## 2023-11-09 VITALS — BP 104/60 | HR 54 | Temp 97.5°F | Ht 62.0 in | Wt 131.0 lb

## 2023-11-09 DIAGNOSIS — Z8659 Personal history of other mental and behavioral disorders: Secondary | ICD-10-CM | POA: Diagnosis not present

## 2023-11-09 DIAGNOSIS — N939 Abnormal uterine and vaginal bleeding, unspecified: Secondary | ICD-10-CM

## 2023-11-09 DIAGNOSIS — Z8639 Personal history of other endocrine, nutritional and metabolic disease: Secondary | ICD-10-CM

## 2023-11-09 DIAGNOSIS — Z0000 Encounter for general adult medical examination without abnormal findings: Secondary | ICD-10-CM | POA: Diagnosis not present

## 2023-11-09 DIAGNOSIS — R7989 Other specified abnormal findings of blood chemistry: Secondary | ICD-10-CM

## 2023-11-09 DIAGNOSIS — E782 Mixed hyperlipidemia: Secondary | ICD-10-CM | POA: Diagnosis not present

## 2023-11-09 DIAGNOSIS — Z01812 Encounter for preprocedural laboratory examination: Secondary | ICD-10-CM

## 2023-11-09 DIAGNOSIS — Z8632 Personal history of gestational diabetes: Secondary | ICD-10-CM

## 2023-11-09 MED ORDER — ALPRAZOLAM 0.5 MG PO TABS: 0.5000 mg | ORAL_TABLET | Freq: Every evening | ORAL | 2 refills | Status: AC | PRN

## 2023-11-09 NOTE — Progress Notes (Signed)
 Patient ID: [name], female    DOB: [birth year], 48 y.o.   MRN: [id number]   Assessment & Plan:  Encounter for annual physical exam  History of panic attacks -     ALPRAZolam ; Take 1 tablet (0.5 mg total) by mouth at bedtime as needed for anxiety.  Dispense: 10 tablet; Refill: 2  Abnormal uterine bleeding  Mixed hyperlipidemia  History of gestational diabetes  History of obesity     Assessment & Plan History of prophylactic bilateral mastectomy with breast reconstruction and tissue expanders, scheduled for implant exchange Post-mastectomy status with tissue expanders causing discomfort and soreness, particularly in the morning. Scheduled for implant exchange surgery in November. - Proceed with implant exchange surgery in November - Continue current exercise regimen, avoiding chest exercises  Abnormal uterine bleeding on hormone replacement therapy, improving with Mirena  IUD Abnormal uterine bleeding post-HRT initiation, improved with Mirena  IUD. Bleeding decreased but still present. Estradiol  patch well-tolerated, improving sleep and reducing hot flashes. - Continue Mirena  IUD and estradiol  patch - Follow up with gynecologist next month  Hyperlipidemia, on treatment Hyperlipidemia managed with current treatment. Last cholesterol panel was a year ago. - Check cholesterol panel in October  Pre-diabetes Pre-diabetes with last A1c at 5.1 four months ago, indicating good control. - Monitor A1c as part of routine labs in October  Hypothyroidism, on treatment Hypothyroidism under treatment. Last TSH checked seven months ago. - Check TSH in October  Anxiety, intermittent, managed with as-needed alprazolam  Intermittent anxiety managed with as-needed alprazolam , primarily for dental procedures and MRI-related anxiety. - Prescribe alprazolam  with a couple of refills  Obesity, on pharmacologic therapy, considering change to [facility] Obesity managed with pharmacologic  therapy. Considering switch to [facility] due to insurance changes and cost considerations. Current medication is Zepbound . - Consider switch to [facility] or compounded alternatives if insurance issues persist   Age-appropriate screening and counseling performed today. Preventive measures discussed and printed in AVS for patient.   Patient Counseling: [x]   Nutrition: Stressed importance of moderation in sodium/caffeine intake, saturated fat and cholesterol, caloric balance, sufficient intake of fresh fruits, vegetables, and fiber.  [x]   Stressed the importance of regular exercise.   [x]   Substance Abuse: Discussed cessation/primary prevention of tobacco, alcohol, or other drug use; driving or other dangerous activities under the influence; availability of treatment for abuse.   []   Injury prevention: Discussed safety belts, safety helmets, smoke detector, smoking near bedding or upholstery.   []   Sexuality: Discussed sexually transmitted diseases, partner selection, use of condoms, avoidance of unintended pregnancy  and contraceptive alternatives.   [x]   Dental health: Discussed importance of regular tooth brushing, flossing, and dental visits.  [x]   Health maintenance and immunizations reviewed. Please refer to Health maintenance section.       Return in about 6 months (around 05/11/2024) for recheck/follow-up.    Subjective:    Chief Complaint  Patient presents with   Annual Exam    Physical; no concerns   Medication Refill    Want to discuss maybe getting refill on estradiol  vaginal since doctor has retired; also refill on Xanax     HPI Discussed the use of AI scribe software for clinical note transcription with the patient, who gave verbal consent to proceed.  History of Present Illness [name] is a 48 year old female who presents for an annual physical exam.  She underwent a mastectomy in January, which led to complications requiring a surgery in May for the reinsertion  of the  right tissue expander. She has completed the fills with the tissue expanders and is content with the current size. She is scheduled for another surgery in November to replace the tissue expanders with permanent implants. The expanders cause soreness every morning, described as feeling like 'Tupperware containers' under her skin. She is cautious with exercise, including walking, lifting weights, and running, to avoid overworking her chest area.  She started hormone replacement therapy in the spring after her oncologist's approval post-mastectomy. She uses an estradiol  patch and initially took oral progesterone , but due to significant bleeding, she switched to a Mirena  IUD, discontinuing the oral progesterone . She still experiences some spotting but notes improvement. Her sleep has significantly improved, and she no longer experiences hot flashes.  She is on cholesterol medication. She has been incorporating weights into her routine, attributing a slight weight increase to muscle gain. Her last A1c was 5.1 four months ago, and she is due for more labs in October for her upcoming surgery.  She has not visited dermatology in a couple of years and acknowledges the need for a skin check. She continues to use vaginal estrogen.  Happily married. Three children - 4th, 10th, and college grades.      Past Medical History:  Diagnosis Date   Anxiety 2011   BRCA1 gene mutation positive    dx 01/ 2020   Encounter for weight management 07/12/2022   Family history of breast cancer    Gestational thrombocytopenia (HCC) 06/17/2014   Hemorrhoids    History of gestational diabetes    Hypothyroidism    followed by dr gorge   Mixed hyperlipidemia 04/02/2022    Past Surgical History:  Procedure Laterality Date   BREAST RECONSTRUCTION WITH PLACEMENT OF TISSUE EXPANDER AND FLEX HD (ACELLULAR HYDRATED DERMIS) Bilateral 04/06/2023   Procedure: BILATERAL BREAST RECONSTRUCTION WITH PLACEMENT OF TISSUE  EXPANDER AND FLEX HD (ACELLULAR HYDRATED DERMIS);  Surgeon: Elisabeth Craig RAMAN, MD;  Location: [facility];  Service: Plastics;  Laterality: Bilateral;   BREAST RECONSTRUCTION WITH PLACEMENT OF TISSUE EXPANDER AND FLEX HD (ACELLULAR HYDRATED DERMIS) Bilateral 04/22/2023   Procedure: REVISION OF BREAST MASTECTOMY WITH DEBRIDMENT AND REMOVAL OF RIGHT TISSUE EXPANDER AND FLEX HD (ACELLULAR HYDRATED DERMIS);  Surgeon: Elisabeth Craig RAMAN, MD;  Location: [facility];  Service: Plastics;  Laterality: Bilateral;   CESAREAN SECTION  2007 & 2010   x 2 - at [facility]   CESAREAN SECTION WITH BILATERAL TUBAL LIGATION Bilateral 06/15/2014   Procedure: REPEAT CESAREAN SECTION WITH BILATERAL TUBAL LIGATION;  Surgeon: Charlie gorge, MD;  Location: [facility];  Service: Obstetrics;  Laterality: Bilateral;  EDD: 06/19/14   COLONOSCOPY WITH PROPOFOL   08/31/2012   NIPPLE SPARING MASTECTOMY Bilateral 04/06/2023   Procedure: BILATERAL NIPPLE SPARING MASTECTOMY;  Surgeon: Ebbie Cough, MD;  Location: [facility];  Service: General;  Laterality: Bilateral;  PEC BLOCK   ROBOTIC ASSISTED SALPINGO OOPHERECTOMY Right 10/30/2018   Procedure: XI ROBOTIC ASSISTED SALPINGO OOPHORECTOMY/With Lysis of Adhesions;  Surgeon: gorge Charlie, MD;  Location: [facility];  Service: Gynecology;  Laterality: Right;  Requests 2 hrs.   TISSUE EXPANDER PLACEMENT Right 07/21/2023   Procedure: INSERTION, TISSUE EXPANDER;  Surgeon: Elisabeth Craig RAMAN, MD;  Location: [facility];  Service: Plastics;  Laterality: Right;  WITH ACELLULAR DERMAL MATRIX   TRIGGER FINGER RELEASE Left    TUBAL LIGATION  2016   wisdom teeth ext  teen    Family History  Problem Relation Age of Onset  Diabetes Mother    Breast cancer Mother        dx 81 and 66, BRCA1+   Colon polyps Mother    Fibromyalgia Mother    Cancer Mother    Obesity Mother    Varicose Veins Mother    Kidney disease Father    Heart  disease Father    Hypertension Father    Alcohol abuse Father    Obesity Father    Stroke Father    Diabetes Sister    Obesity Sister    Cancer Maternal Grandmother        breast dx 35s, d. 71s   Breast cancer Paternal Aunt        dx 59s   Leukemia Cousin        dx childhood    Social History   Tobacco Use   Smoking status: Never   Smokeless tobacco: Never  Vaping Use   Vaping status: Never Used  Substance Use Topics   Alcohol use: Not Currently   Drug use: No     No Known Allergies  Review of Systems NEGATIVE UNLESS OTHERWISE INDICATED IN HPI      Objective:     BP 104/60   Pulse (!) 54   Temp (!) 97.5 F (36.4 C)   Ht 5' 2 (1.575 m)   Wt 131 lb (59.4 kg)   SpO2 98%   BMI 23.96 kg/m   Wt Readings from Last 3 Encounters:  11/09/23 131 lb (59.4 kg)  07/21/23 129 lb 6.6 oz (58.7 kg)  06/08/23 126 lb (57.2 kg)    BP Readings from Last 3 Encounters:  11/09/23 104/60  07/21/23 127/72  06/08/23 117/74     Physical Exam Vitals and nursing note reviewed.  Constitutional:      Appearance: Normal appearance. She is normal weight. She is not toxic-appearing.  HENT:     Head: Normocephalic and atraumatic.     Right Ear: Tympanic membrane, ear canal and external ear normal.     Left Ear: Tympanic membrane, ear canal and external ear normal.     Nose: Nose normal.     Mouth/Throat:     Mouth: Mucous membranes are moist.  Eyes:     Extraocular Movements: Extraocular movements intact.     Conjunctiva/sclera: Conjunctivae normal.     Pupils: Pupils are equal, round, and reactive to light.  Cardiovascular:     Rate and Rhythm: Normal rate and regular rhythm.     Pulses: Normal pulses.     Heart sounds: Normal heart sounds.  Pulmonary:     Effort: Pulmonary effort is normal.     Breath sounds: Normal breath sounds.  Abdominal:     General: Abdomen is flat. Bowel sounds are normal.     Palpations: Abdomen is soft.  Musculoskeletal:        General:  Normal range of motion.     Cervical back: Normal range of motion and neck supple.     Right lower leg: No edema.     Left lower leg: No edema.  Skin:    General: Skin is warm and dry.     Findings: No lesion or rash.  Neurological:     General: No focal deficit present.     Mental Status: She is alert and oriented to person, place, and time.  Psychiatric:        Mood and Affect: Mood normal.        Behavior: Behavior normal.  Thought Content: Thought content normal.        Judgment: Judgment normal.             Elanore Talcott M Alven Alverio, PA-C

## 2023-11-09 NOTE — Patient Instructions (Signed)
 Always great to see you! Keep up good work!

## 2023-11-17 DIAGNOSIS — Z01419 Encounter for gynecological examination (general) (routine) without abnormal findings: Secondary | ICD-10-CM | POA: Diagnosis not present

## 2023-11-17 DIAGNOSIS — D229 Melanocytic nevi, unspecified: Secondary | ICD-10-CM | POA: Diagnosis not present

## 2023-11-17 DIAGNOSIS — L814 Other melanin hyperpigmentation: Secondary | ICD-10-CM | POA: Diagnosis not present

## 2023-11-17 DIAGNOSIS — Z6823 Body mass index (BMI) 23.0-23.9, adult: Secondary | ICD-10-CM | POA: Diagnosis not present

## 2023-11-17 DIAGNOSIS — L578 Other skin changes due to chronic exposure to nonionizing radiation: Secondary | ICD-10-CM | POA: Diagnosis not present

## 2023-11-27 ENCOUNTER — Other Ambulatory Visit: Payer: Self-pay | Admitting: Physician Assistant

## 2023-12-14 ENCOUNTER — Other Ambulatory Visit

## 2023-12-19 ENCOUNTER — Other Ambulatory Visit

## 2023-12-19 ENCOUNTER — Ambulatory Visit: Payer: Self-pay | Admitting: Physician Assistant

## 2023-12-19 DIAGNOSIS — R7989 Other specified abnormal findings of blood chemistry: Secondary | ICD-10-CM

## 2023-12-19 DIAGNOSIS — Z01812 Encounter for preprocedural laboratory examination: Secondary | ICD-10-CM | POA: Diagnosis not present

## 2023-12-19 DIAGNOSIS — E782 Mixed hyperlipidemia: Secondary | ICD-10-CM | POA: Diagnosis not present

## 2023-12-19 LAB — CBC WITH DIFFERENTIAL/PLATELET
Basophils Absolute: 0 K/uL (ref 0.0–0.1)
Basophils Relative: 0.2 % (ref 0.0–3.0)
Eosinophils Absolute: 0 K/uL (ref 0.0–0.7)
Eosinophils Relative: 0.5 % (ref 0.0–5.0)
HCT: 40.4 % (ref 36.0–46.0)
Hemoglobin: 13.7 g/dL (ref 12.0–15.0)
Lymphocytes Relative: 20.8 % (ref 12.0–46.0)
Lymphs Abs: 0.9 K/uL (ref 0.7–4.0)
MCHC: 33.9 g/dL (ref 30.0–36.0)
MCV: 92.6 fl (ref 78.0–100.0)
Monocytes Absolute: 0.3 K/uL (ref 0.1–1.0)
Monocytes Relative: 6.3 % (ref 3.0–12.0)
Neutro Abs: 3.2 K/uL (ref 1.4–7.7)
Neutrophils Relative %: 72.2 % (ref 43.0–77.0)
Platelets: 187 K/uL (ref 150.0–400.0)
RBC: 4.36 Mil/uL (ref 3.87–5.11)
RDW: 13 % (ref 11.5–15.5)
WBC: 4.4 K/uL (ref 4.0–10.5)

## 2023-12-19 LAB — COMPREHENSIVE METABOLIC PANEL WITH GFR
ALT: 22 U/L (ref 0–35)
AST: 34 U/L (ref 0–37)
Albumin: 4.6 g/dL (ref 3.5–5.2)
Alkaline Phosphatase: 56 U/L (ref 39–117)
BUN: 16 mg/dL (ref 6–23)
CO2: 23 meq/L (ref 19–32)
Calcium: 9.4 mg/dL (ref 8.4–10.5)
Chloride: 105 meq/L (ref 96–112)
Creatinine, Ser: 0.85 mg/dL (ref 0.40–1.20)
GFR: 82.45 mL/min (ref >60.00)
Glucose, Bld: 83 mg/dL (ref 70–99)
Potassium: 4.8 meq/L (ref 3.5–5.1)
Sodium: 139 meq/L (ref 135–145)
Total Bilirubin: 0.8 mg/dL (ref 0.2–1.2)
Total Protein: 7.1 g/dL (ref 6.0–8.3)

## 2023-12-19 LAB — LIPID PANEL
Cholesterol: 177 mg/dL (ref 0–200)
HDL: 91.4 mg/dL (ref >39.00)
LDL Cholesterol: 78 mg/dL (ref 0–99)
NonHDL: 85.44
Total CHOL/HDL Ratio: 2
Triglycerides: 39 mg/dL (ref 0.0–149.0)
VLDL: 7.8 mg/dL (ref 0.0–40.0)

## 2023-12-19 LAB — PROTIME-INR
INR: 1.2 ratio — ABNORMAL HIGH (ref 0.8–1.0)
Prothrombin Time: 12.8 s (ref 9.6–13.1)

## 2023-12-19 LAB — APTT: aPTT: 29 s (ref 25.4–36.8)

## 2023-12-19 LAB — HEMOGLOBIN A1C: Hgb A1c MFr Bld: 5.2 % (ref 4.6–6.5)

## 2023-12-19 LAB — TSH: TSH: 2.52 u[IU]/mL (ref 0.35–5.50)

## 2024-01-10 DIAGNOSIS — N950 Postmenopausal bleeding: Secondary | ICD-10-CM | POA: Diagnosis not present

## 2024-01-18 DIAGNOSIS — Z421 Encounter for breast reconstruction following mastectomy: Secondary | ICD-10-CM | POA: Diagnosis not present

## 2024-01-18 DIAGNOSIS — Z411 Encounter for cosmetic surgery: Secondary | ICD-10-CM | POA: Diagnosis not present

## 2024-04-06 ENCOUNTER — Other Ambulatory Visit: Payer: Self-pay | Admitting: Physician Assistant

## 2024-05-16 ENCOUNTER — Ambulatory Visit: Admitting: Physician Assistant
